# Patient Record
Sex: Female | Born: 1995
Health system: Southern US, Community
[De-identification: ages and names within clinical notes are randomized; demographics above are authoritative.]

## PROBLEM LIST (undated history)

## (undated) ENCOUNTER — Emergency Department (HOSPITAL_BASED_OUTPATIENT_CLINIC_OR_DEPARTMENT_OTHER): Admission: EM | Payer: Self-pay | Source: Home / Self Care

## (undated) DIAGNOSIS — F909 Attention-deficit hyperactivity disorder, unspecified type: Secondary | ICD-10-CM

## (undated) DIAGNOSIS — R519 Headache, unspecified: Secondary | ICD-10-CM

## (undated) DIAGNOSIS — H04129 Dry eye syndrome of unspecified lacrimal gland: Secondary | ICD-10-CM

## (undated) DIAGNOSIS — M545 Low back pain, unspecified: Secondary | ICD-10-CM

## (undated) DIAGNOSIS — R51 Headache: Secondary | ICD-10-CM

## (undated) DIAGNOSIS — H539 Unspecified visual disturbance: Secondary | ICD-10-CM

## (undated) DIAGNOSIS — F419 Anxiety disorder, unspecified: Secondary | ICD-10-CM

## (undated) DIAGNOSIS — Z8719 Personal history of other diseases of the digestive system: Secondary | ICD-10-CM

## (undated) DIAGNOSIS — H47339 Pseudopapilledema of optic disc, unspecified eye: Secondary | ICD-10-CM

## (undated) DIAGNOSIS — M25519 Pain in unspecified shoulder: Secondary | ICD-10-CM

## (undated) DIAGNOSIS — M542 Cervicalgia: Secondary | ICD-10-CM

## (undated) DIAGNOSIS — H8110 Benign paroxysmal vertigo, unspecified ear: Secondary | ICD-10-CM

## (undated) DIAGNOSIS — J189 Pneumonia, unspecified organism: Secondary | ICD-10-CM

## (undated) DIAGNOSIS — M5126 Other intervertebral disc displacement, lumbar region: Secondary | ICD-10-CM

## (undated) HISTORY — DX: Pain in unspecified shoulder: M25.519

## (undated) HISTORY — DX: Attention-deficit hyperactivity disorder, unspecified type: F90.9

## (undated) HISTORY — DX: Low back pain: M54.5

## (undated) HISTORY — DX: Low back pain, unspecified: M54.50

## (undated) HISTORY — DX: Headache: R51

## (undated) HISTORY — DX: Cervicalgia: M54.2

## (undated) HISTORY — DX: Benign paroxysmal vertigo, unspecified ear: H81.10

## (undated) HISTORY — DX: Headache, unspecified: R51.9

## (undated) HISTORY — DX: Pseudopapilledema of optic disc, unspecified eye: H47.339

## (undated) HISTORY — DX: Anxiety disorder, unspecified: F41.9

## (undated) HISTORY — PX: OTHER SURGICAL HISTORY: SHX169

## (undated) HISTORY — DX: Personal history of other diseases of the digestive system: Z87.19

## (undated) HISTORY — DX: Dry eye syndrome of unspecified lacrimal gland: H04.129

---
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
## 2015-01-24 ENCOUNTER — Ambulatory Visit: Payer: 59 | Admitting: Sports Medicine

## 2015-01-27 ENCOUNTER — Encounter: Payer: Self-pay | Admitting: Sports Medicine

## 2015-01-27 ENCOUNTER — Ambulatory Visit (INDEPENDENT_AMBULATORY_CARE_PROVIDER_SITE_OTHER): Payer: 59 | Admitting: Sports Medicine

## 2015-01-27 VITALS — BP 148/83 | HR 98 | Ht 67.0 in | Wt 296.0 lb

## 2015-01-27 DIAGNOSIS — E669 Obesity, unspecified: Secondary | ICD-10-CM

## 2015-01-27 DIAGNOSIS — G43909 Migraine, unspecified, not intractable, without status migrainosus: Secondary | ICD-10-CM | POA: Insufficient documentation

## 2015-01-27 DIAGNOSIS — G43001 Migraine without aura, not intractable, with status migrainosus: Secondary | ICD-10-CM | POA: Diagnosis not present

## 2015-01-27 DIAGNOSIS — R635 Abnormal weight gain: Secondary | ICD-10-CM | POA: Insufficient documentation

## 2015-01-27 MED ORDER — PHENTERMINE HCL 37.5 MG PO TABS: ORAL_TABLET | ORAL | Status: DC

## 2015-01-27 MED ORDER — LIRAGLUTIDE -WEIGHT MANAGEMENT 18 MG/3ML ~~LOC~~ SOPN: 3.0000 mg | PEN_INJECTOR | Freq: Every day | SUBCUTANEOUS | Status: DC

## 2015-01-27 MED ORDER — TOPIRAMATE 50 MG PO TABS: ORAL_TABLET | ORAL | Status: DC

## 2015-01-27 NOTE — Progress Notes (Signed)
  Subjective:    CC: headaches and obesity  HPI: This is a pleasant 19 year old female, she comes in with complaints of inability to lose weight, she wonders if her thyroid function is abnormal. We did check her thyroid function less than 1 year ago and her TSH was completely normal. She denies any symptoms of hypothyroidism. She does not exercise, she tried eating correctly, but does not yet know any specifics regarding calorie counting.  Headaches: History of Arnold-Chiari malformation post foramen magnum surgery.headaches are bifrontal, also posterior.  Positive for photophobia, phonophobia, nausea, they last for days, no aura. No focal findings.  Past medical history, Surgical history, Family history not pertinant except as noted below, Social history, Allergies, and medications have been entered into the medical record, reviewed, and no changes needed.   Review of Systems: No fevers, chills, night sweats, weight loss, chest pain, or shortness of breath.   Objective:    General: Well Developed, well nourished, and in no acute distress.  Neuro: Alert and oriented x3, extra-ocular muscles intact, sensation grossly intact.  HEENT: Normocephalic, atraumatic, pupils equal round reactive to light, neck supple, no masses, no lymphadenopathy, thyroid nonpalpable.  Skin: Warm and dry, no rashes. Cardiac: Regular rate and rhythm, no murmurs rubs or gallops, no lower extremity edema.  Respiratory: Clear to auscultation bilaterally. Not using accessory muscles, speaking in full sentences.  Impression and Recommendations:    I spent 40 minutes with this patient, greater than 50% was face-to-face time calcium regarding the above diagnosis.

## 2015-01-27 NOTE — Assessment & Plan Note (Addendum)
Starting phentermine and Saxenda. Also started exercise prescription Return monthly for weight checks and refills.

## 2015-01-27 NOTE — Assessment & Plan Note (Signed)
Headaches do closely resemble migraines. She has a history of Arnold-Chiari malformation and is post foremen magnum foraminotomy. We are going to start Topamax for migraine prevention, losing weight is going to help significantly. She will also discuss a new brain MRI with her neurosurgeon, she is approximately one year post her previous MRI.

## 2015-01-28 ENCOUNTER — Telehealth: Payer: Self-pay | Admitting: Sports Medicine

## 2015-01-28 NOTE — Telephone Encounter (Signed)
Received fax for prior authorization on Saxenda sent through cover my meds waiting on authorization. - CF °

## 2015-02-04 NOTE — Telephone Encounter (Signed)
Received fax from [employer] they denied coverage on Saxenda due to being used with other weight loss medications. I resubmitted and I am waiting on authorization. Case ID [id number]. - CF

## 2015-02-04 NOTE — Telephone Encounter (Signed)
Received fax from [employer] and they approved Saxenda from 02/04/2015 - 05/27/2015. Case # [id number] - Cf

## 2015-02-05 ENCOUNTER — Telehealth: Payer: Self-pay

## 2015-02-06 NOTE — Telephone Encounter (Signed)
ERROR. Emma Boyer,CMA  

## 2015-02-24 ENCOUNTER — Ambulatory Visit (INDEPENDENT_AMBULATORY_CARE_PROVIDER_SITE_OTHER): Payer: 59 | Admitting: Sports Medicine

## 2015-02-24 ENCOUNTER — Encounter: Payer: Self-pay | Admitting: Sports Medicine

## 2015-02-24 VITALS — BP 137/87 | HR 117 | Ht 67.0 in | Wt 280.0 lb

## 2015-02-24 DIAGNOSIS — E669 Obesity, unspecified: Secondary | ICD-10-CM | POA: Diagnosis not present

## 2015-02-24 DIAGNOSIS — G43009 Migraine without aura, not intractable, without status migrainosus: Secondary | ICD-10-CM | POA: Diagnosis not present

## 2015-02-24 MED ORDER — PHENTERMINE HCL 37.5 MG PO TABS: ORAL_TABLET | ORAL | Status: DC

## 2015-02-24 MED ORDER — TOPIRAMATE 25 MG PO TABS: 25.0000 mg | ORAL_TABLET | Freq: Every day | ORAL | Status: DC

## 2015-02-24 NOTE — Assessment & Plan Note (Signed)
16 pound weight loss in the first month.  Refilling phentermine, continue Saxenda and Topamax.

## 2015-02-24 NOTE — Assessment & Plan Note (Signed)
Intolerant of 50 mgTopamax.  Simply refilling at 25 mg daily.

## 2015-02-24 NOTE — Progress Notes (Signed)
  Subjective:    CC: Follow-up  HPI: Obesity: 16 pound weight loss in the first month, she phentermine, Saxenda, was unable to tolerate Topamax 50 milligrams and has dropped back down to 25. No adverse effects other than this.  Past medical history, Surgical history, Family history not pertinant except as noted below, Social history, Allergies, and medications have been entered into the medical record, reviewed, and no changes needed.   Review of Systems: No fevers, chills, night sweats, weight loss, chest pain, or shortness of breath.   Objective:    General: Well Developed, well nourished, and in no acute distress.  Neuro: Alert and oriented x3, extra-ocular muscles intact, sensation grossly intact.  HEENT: Normocephalic, atraumatic, pupils equal round reactive to light, neck supple, no masses, no lymphadenopathy, thyroid nonpalpable.  Skin: Warm and dry, no rashes. Cardiac: Regular rate and rhythm, no murmurs rubs or gallops, no lower extremity edema.  Respiratory: Clear to auscultation bilaterally. Not using accessory muscles, speaking in full sentences.  Impression and Recommendations:

## 2015-03-17 ENCOUNTER — Other Ambulatory Visit: Payer: Self-pay | Admitting: Sports Medicine

## 2015-03-22 ENCOUNTER — Emergency Department (HOSPITAL_BASED_OUTPATIENT_CLINIC_OR_DEPARTMENT_OTHER): Payer: 59

## 2015-03-22 ENCOUNTER — Encounter (HOSPITAL_BASED_OUTPATIENT_CLINIC_OR_DEPARTMENT_OTHER): Payer: Self-pay

## 2015-03-22 ENCOUNTER — Emergency Department
Admission: EM | Admit: 2015-03-22 | Discharge: 2015-03-22 | Disposition: A | Discharge status: Home Health | Payer: 59 | Source: Home / Self Care

## 2015-03-22 ENCOUNTER — Emergency Department (HOSPITAL_BASED_OUTPATIENT_CLINIC_OR_DEPARTMENT_OTHER)
Admission: EM | Admit: 2015-03-22 | Discharge: 2015-03-22 | Disposition: A | Discharge status: Home / Self Care | Payer: 59 | Attending: Emergency Medicine | Admitting: Emergency Medicine

## 2015-03-22 DIAGNOSIS — Z8669 Personal history of other diseases of the nervous system and sense organs: Secondary | ICD-10-CM | POA: Insufficient documentation

## 2015-03-22 DIAGNOSIS — R102 Pelvic and perineal pain: Secondary | ICD-10-CM | POA: Diagnosis not present

## 2015-03-22 DIAGNOSIS — N939 Abnormal uterine and vaginal bleeding, unspecified: Secondary | ICD-10-CM | POA: Insufficient documentation

## 2015-03-22 DIAGNOSIS — Z8739 Personal history of other diseases of the musculoskeletal system and connective tissue: Secondary | ICD-10-CM | POA: Insufficient documentation

## 2015-03-22 DIAGNOSIS — Z8701 Personal history of pneumonia (recurrent): Secondary | ICD-10-CM | POA: Insufficient documentation

## 2015-03-22 DIAGNOSIS — Z79899 Other long term (current) drug therapy: Secondary | ICD-10-CM | POA: Insufficient documentation

## 2015-03-22 DIAGNOSIS — R339 Retention of urine, unspecified: Secondary | ICD-10-CM | POA: Insufficient documentation

## 2015-03-22 DIAGNOSIS — R52 Pain, unspecified: Secondary | ICD-10-CM

## 2015-03-22 DIAGNOSIS — Z3202 Encounter for pregnancy test, result negative: Secondary | ICD-10-CM | POA: Diagnosis not present

## 2015-03-22 LAB — CBC WITH DIFFERENTIAL/PLATELET
BASOS ABS: 0 10*3/uL (ref 0.0–0.1)
Basophils Relative: 0 % (ref 0–1)
EOS ABS: 0.1 10*3/uL (ref 0.0–0.7)
EOS PCT: 1 % (ref 0–5)
HCT: 42.1 % (ref 36.0–46.0)
HEMOGLOBIN: 13.6 g/dL (ref 12.0–15.0)
Lymphocytes Relative: 27 % (ref 12–46)
Lymphs Abs: 3.5 10*3/uL (ref 0.7–4.0)
MCH: 27.1 pg (ref 26.0–34.0)
MCHC: 32.3 g/dL (ref 30.0–36.0)
MCV: 84 fL (ref 78.0–100.0)
Monocytes Absolute: 0.8 10*3/uL (ref 0.1–1.0)
Monocytes Relative: 7 % (ref 3–12)
NEUTROS ABS: 8.3 10*3/uL — AB (ref 1.7–7.7)
Neutrophils Relative %: 65 % (ref 43–77)
PLATELETS: 208 10*3/uL (ref 150–400)
RBC: 5.01 MIL/uL (ref 3.87–5.11)
RDW: 14 % (ref 11.5–15.5)
WBC: 12.7 10*3/uL — AB (ref 4.0–10.5)

## 2015-03-22 LAB — BASIC METABOLIC PANEL
Anion gap: 10 (ref 5–15)
BUN: 6 mg/dL (ref 6–20)
CHLORIDE: 107 mmol/L (ref 101–111)
CO2: 22 mmol/L (ref 22–32)
Calcium: 9.4 mg/dL (ref 8.9–10.3)
Creatinine, Ser: 0.72 mg/dL (ref 0.44–1.00)
GFR calc Af Amer: >60 mL/min (ref >60)
GFR calc non Af Amer: >60 mL/min (ref >60)
GLUCOSE: 79 mg/dL (ref 65–99)
Potassium: 3.3 mmol/L — ABNORMAL LOW (ref 3.5–5.1)
Sodium: 139 mmol/L (ref 135–145)

## 2015-03-22 LAB — URINE MICROSCOPIC-ADD ON

## 2015-03-22 LAB — URINALYSIS, ROUTINE W REFLEX MICROSCOPIC
BILIRUBIN URINE: NEGATIVE
Glucose, UA: NEGATIVE mg/dL
KETONES UR: 15 mg/dL — AB
LEUKOCYTES UA: NEGATIVE
Nitrite: POSITIVE — AB
PH: 6.5 (ref 5.0–8.0)
Protein, ur: NEGATIVE mg/dL
Specific Gravity, Urine: 1.004 — ABNORMAL LOW (ref 1.005–1.030)
Urobilinogen, UA: 1 mg/dL (ref 0.0–1.0)

## 2015-03-22 LAB — PREGNANCY, URINE: Preg Test, Ur: NEGATIVE

## 2015-03-22 MED ORDER — PHENAZOPYRIDINE HCL 200 MG PO TABS: 200.0000 mg | ORAL_TABLET | Freq: Three times a day (TID) | ORAL | Status: DC

## 2015-03-22 MED ORDER — ONDANSETRON 4 MG PO TBDP
4.0000 mg | ORAL_TABLET | Freq: Once | ORAL | Status: AC
  Administered 2015-03-22: 4 mg via ORAL
  Filled 2015-03-22: qty 1

## 2015-03-22 MED ORDER — HYDROCODONE-ACETAMINOPHEN 5-325 MG PO TABS
2.0000 | ORAL_TABLET | Freq: Once | ORAL | Status: AC
  Administered 2015-03-22: 2 via ORAL
  Filled 2015-03-22: qty 2

## 2015-03-22 MED ORDER — PHENAZOPYRIDINE HCL 100 MG PO TABS
100.0000 mg | ORAL_TABLET | Freq: Once | ORAL | Status: AC
  Administered 2015-03-22: 100 mg via ORAL
  Filled 2015-03-22: qty 1

## 2015-03-22 NOTE — ED Notes (Signed)
Converted urinary bag to leg bag per Dr. Fayrene Fearing verbal order. Discussed with patient and patient's sister at bedside urinary foley care. Both verbalized understanding. Urinary catheter seal broken and leg bag placed on patient , secured with leg straps.

## 2015-03-22 NOTE — Discharge Instructions (Signed)
Call [facility] at the number above for an appointment next week to discuss removal of your catheter, and further evaluation.  Acute Urinary Retention Acute urinary retention is the temporary inability to urinate. This is an uncommon problem in women. It can be caused by:  Infection.  A side effect of a medicine.  A problem in a nearby organ that presses or squeezes on the bladder or the urethra (the tube that drains the bladder).  Psychological problems.   Surgery on your bladder, urethra, or pelvic organs that causes obstruction to the outflow of urine from your bladder.  Overfilling of the bladder, a "bladder stretch injury" HOME CARE INSTRUCTIONS  If you are sent home with a Foley catheter and a drainage system, you will need to discuss the best course of action with your health care provider. While the catheter is in, maintain a good intake of fluids. Keep the drainage bag emptied and lower than your catheter. This is so that contaminated urine will not flow back into your bladder, which could lead to a urinary tract infection. There are two main types of drainage bags. One is a large bag that usually is used at night. It has a good capacity that will allow you to sleep through the night without having to empty it. The second type is called a leg bag. It has a smaller capacity so it needs to be emptied more frequently. However, the main advantage is that it can be attached by a leg strap and goes underneath your clothing, allowing you the freedom to move about or leave your home. Only take over-the-counter or prescription medicines for pain, discomfort, or fever as directed by your health care provider.  SEEK MEDICAL CARE IF:  You develop a low-grade fever.  You experience spasms or leakage of urine with the spasms. SEEK IMMEDIATE MEDICAL CARE IF:   You develop chills or fever.  Your catheter stops draining urine.  Your catheter falls out.  You start to develop increased  bleeding that does not respond to rest and increased fluid intake. MAKE SURE YOU:  Understand these instructions.  Will watch your condition.  Will get help right away if you are not doing well or get worse. Document Released: 08/01/2006 Document Revised: 05/23/2013 Document Reviewed: 01/11/2013 [facility] Patient Information 2015 [place], [place]. This information is not intended to replace advice given to you by your health care provider. Make sure you discuss any questions you have with your health care provider.

## 2015-03-22 NOTE — ED Provider Notes (Signed)
CSN: [id number]   Arrival date & time 03/22/15 1841  History  This chart was scribed for  Rolland Porter, MD by Bethel Born, ED Scribe. This patient was seen in room MH05/MH05 and the patient's care was started at 8:45 PM.  Chief Complaint  Patient presents with  . Urinary Retention    HPI The history is provided by the patient and a relative. No language interpreter was used.   Emma Boyer is a 19 y.o. female who presents to the Emergency Department complaining of urinary retention with onset last night. She felt strongly that she had to urinate last night but nothing would come out. She had a small trickle in the shower and took Azo. This morning she urinated normally and had relief. The feeling of fullness in the lower abdomen and inability to void returned this afternoon. Pt states that she had daily vaginal bleeding for 4 months until 2 weeks ago. The bleeding returned today.   Pt has been getting a daily Saxenda injection for 1 month for weight loss but has had no urinary problems until now.   Past Medical History  Diagnosis Date  . Pseudopapilledema   . HA (headache)   . Neck pain   . Shoulder pain   . Low back pain   . Bulging lumbar disc 2014  . Pneumonia   . Vision abnormalities     wears contacts, right eye worse    Past Surgical History  Procedure Laterality Date  . None    . Suboccipital craniectomy cervical laminectomy N/A 05/01/2013    Procedure: Chiari Decompression;  Surgeon: Maeola Harman, MD;  Location: [facility];  Service: Neurosurgery;  Laterality: N/A;  Chiari Decompression    Family History  Problem Relation Age of Onset  . High blood pressure Father   . Arthritis Father     History  Substance Use Topics  . Smoking status: Never Smoker   . Smokeless tobacco: Never Used  . Alcohol Use: No     Review of Systems  Constitutional: Negative for fever, chills, diaphoresis, appetite change and fatigue.  HENT: Negative for mouth sores, sore throat  and trouble swallowing.   Eyes: Negative for visual disturbance.  Respiratory: Negative for cough, chest tightness, shortness of breath and wheezing.   Cardiovascular: Negative for chest pain.  Gastrointestinal: Negative for nausea, vomiting, diarrhea and abdominal distention.  Endocrine: Negative for polydipsia, polyphagia and polyuria.  Genitourinary: Positive for decreased urine volume, vaginal bleeding and pelvic pain. Negative for dysuria, frequency, hematuria and vaginal discharge.  Musculoskeletal: Negative for gait problem.  Skin: Negative for color change, pallor and rash.  Neurological: Negative for dizziness, syncope, light-headedness and headaches.  Hematological: Does not bruise/bleed easily.  Psychiatric/Behavioral: Negative for behavioral problems and confusion.     Home Medications   Prior to Admission medications   Medication Sig Start Date End Date Taking? Authorizing Provider  ampicillin (PRINCIPEN) 500 MG capsule Take 500 mg by mouth once.   Yes Historical Provider, MD  phenazopyridine (PYRIDIUM) 200 MG tablet Take 1 tablet (200 mg total) by mouth 3 (three) times daily. 03/22/15   Rolland Porter, MD  phentermine (ADIPEX-P) 37.5 MG tablet One tab by mouth at 11 AM 02/24/15   Monica Becton, MD  SAXENDA 18 MG/3ML SOPN INJECT 3MG  INTO THE SKIN DAILY 0.6MG  INJECTED SUBCUTANEOUSLY DAILY X 1 WEEK THEN INCREASE BY 0.6MG  WEEKLY UNTIL REACHING 3MG  DAILY 03/17/15   Monica Becton, MD  topiramate (TOPAMAX) 25 MG tablet Take  1 tablet (25 mg total) by mouth daily. 02/24/15   Monica Becton, MD    Allergies  Peanut-containing drug products  Triage Vitals: BP 136/72 mmHg  Pulse 102  Temp(Src) 99 F (37.2 C) (Oral)  Resp 16  SpO2 100%  Physical Exam  Constitutional: She is oriented to person, place, and time. She appears well-developed and well-nourished. No distress.  HENT:  Head: Normocephalic.  Eyes: Conjunctivae are normal. Pupils are equal, round, and  reactive to light. No scleral icterus.  Neck: Normal range of motion. Neck supple. No thyromegaly present.  Cardiovascular: Normal rate and regular rhythm.  Exam reveals no gallop and no friction rub.   No murmur heard. Pulmonary/Chest: Effort normal and breath sounds normal. No respiratory distress. She has no wheezes. She has no rales.  Abdominal: Soft. Bowel sounds are normal. She exhibits no distension. There is no tenderness. There is no rebound.  Musculoskeletal: Normal range of motion.  Neurological: She is alert and oriented to person, place, and time.  Skin: Skin is warm and dry. No rash noted.  Psychiatric: She has a normal mood and affect. Her behavior is normal.    ED Course  Procedures   DIAGNOSTIC STUDIES: Oxygen Saturation is 100% on RA, normal by my interpretation.    COORDINATION OF CARE: 8:57 PM Discussed treatment plan which includes lab work, [place], and Urology consult with pt at bedside and pt agreed to plan.  9:18 PM-Consult complete with Dr. Nunzio Cory (Urology). Patient case explained and discussed.  Call ended at 9:19 PM   Labs Review-  Labs Reviewed  URINALYSIS, ROUTINE W REFLEX MICROSCOPIC (NOT AT [facility]) - Abnormal; Notable for the following:    Color, Urine ORANGE (*)    Specific Gravity, Urine 1.004 (*)    Hgb urine dipstick TRACE (*)    Ketones, ur 15 (*)    Nitrite POSITIVE (*)    All other components within normal limits  CBC WITH DIFFERENTIAL/PLATELET - Abnormal; Notable for the following:    WBC 12.7 (*)    Neutro Abs 8.3 (*)    All other components within normal limits  BASIC METABOLIC PANEL - Abnormal; Notable for the following:    Potassium 3.3 (*)    All other components within normal limits  PREGNANCY, URINE  URINE MICROSCOPIC-ADD ON    Imaging Review US Transvaginal Non-ob  03/22/2015   CLINICAL DATA:  Dysfunctional uterine bleeding for 4 months, sudden onset of urinary retention this morning, RIGHT flank and RIGHT lower quadrant pain  EXAM:  TRANSABDOMINAL AND TRANSVAGINAL ULTRASOUND OF PELVIS  TECHNIQUE: Both transabdominal and transvaginal ultrasound examinations of the pelvis were performed. Transabdominal technique was performed for global imaging of the pelvis including uterus, ovaries, adnexal regions, and pelvic cul-de-sac. It was necessary to proceed with endovaginal exam following the transabdominal exam to visualize the endometrium and LEFT ovary.  COMPARISON:  None  FINDINGS: Uterus  Measurements: 8.3 x 3.4 x 4.6 cm. Normal morphology without mass.  Endometrium  Thickness: 5 mm thick, normal. Small amount of endocervical fluid. Endometrial complex otherwise normal appearance.  Right ovary  Measurements: 4.0 x 2.1 x 3.2 cm. Simple cyst within RIGHT ovary 4.2 x 2.4 x 3.0 cm.  Left ovary  Measurements: 3.1 x 1.7 x 2.5 cm. Normal morphology without mass.  Other findings  No free fluid or additional adnexal masses.  IMPRESSION: Small RIGHT ovarian cyst.  Small amount of nonspecific endocervical fluid.  Otherwise negative exam.   Electronically Signed   By: Loraine Leriche  Tyron Russell M.D.   On: 03/22/2015 22:22   US Pelvis Complete  03/22/2015   CLINICAL DATA:  Dysfunctional uterine bleeding for 4 months, sudden onset of urinary retention this morning, RIGHT flank and RIGHT lower quadrant pain  EXAM: TRANSABDOMINAL AND TRANSVAGINAL ULTRASOUND OF PELVIS  TECHNIQUE: Both transabdominal and transvaginal ultrasound examinations of the pelvis were performed. Transabdominal technique was performed for global imaging of the pelvis including uterus, ovaries, adnexal regions, and pelvic cul-de-sac. It was necessary to proceed with endovaginal exam following the transabdominal exam to visualize the endometrium and LEFT ovary.  COMPARISON:  None  FINDINGS: Uterus  Measurements: 8.3 x 3.4 x 4.6 cm. Normal morphology without mass.  Endometrium  Thickness: 5 mm thick, normal. Small amount of endocervical fluid. Endometrial complex otherwise normal appearance.  Right ovary   Measurements: 4.0 x 2.1 x 3.2 cm. Simple cyst within RIGHT ovary 4.2 x 2.4 x 3.0 cm.  Left ovary  Measurements: 3.1 x 1.7 x 2.5 cm. Normal morphology without mass.  Other findings  No free fluid or additional adnexal masses.  IMPRESSION: Small RIGHT ovarian cyst.  Small amount of nonspecific endocervical fluid.  Otherwise negative exam.   Electronically Signed   By: Ulyses Southward M.D.   On: 03/22/2015 22:22    EKG Interpretation None      MDM   Final diagnoses:  Urinary retention   I discussed the case with Dr. Nunzio Cory of [facility]. He did not feel that any CNS or neuro imaging was indicated. I would agree. She has no neurological symptoms or findings other than her urinary retention. Pelvic ultrasound shows no acute findings. Urine not infected. Fitted with leg bag. Will be discharged home. Instructed in leg bag and catheter care. [facility] for follow-up.    Rolland Porter, MD 03/22/15 [phone]

## 2015-03-22 NOTE — ED Notes (Signed)
14 fr urinary foley placed under sterile procedure. Discussed with patient the process and she verbalized understanding. Area cleaned with soap and water. Urinary foley placed without difficulty. Amber color urine present. Catheter bag marked with date, time and placement. Patient reported no c/o of pain or difficulty.

## 2015-03-22 NOTE — ED Notes (Signed)
Patient here with urinary retention since this am. Reports that she noticed she was having trouble voiding last pm and then voided this am and minimal output since. Denies dysuria but complains of some pelvic fullness

## 2015-03-24 ENCOUNTER — Ambulatory Visit: Payer: 59 | Admitting: Sports Medicine

## 2015-03-31 ENCOUNTER — Ambulatory Visit (INDEPENDENT_AMBULATORY_CARE_PROVIDER_SITE_OTHER): Payer: 59 | Admitting: Sports Medicine

## 2015-03-31 ENCOUNTER — Encounter: Payer: Self-pay | Admitting: Sports Medicine

## 2015-03-31 VITALS — BP 122/76 | HR 97 | Ht 67.0 in | Wt 271.0 lb

## 2015-03-31 DIAGNOSIS — E669 Obesity, unspecified: Secondary | ICD-10-CM

## 2015-03-31 MED ORDER — LIRAGLUTIDE -WEIGHT MANAGEMENT 18 MG/3ML ~~LOC~~ SOPN: 3.0000 mg | PEN_INJECTOR | Freq: Every day | SUBCUTANEOUS | Status: DC

## 2015-03-31 MED ORDER — PHENTERMINE HCL 37.5 MG PO TABS: ORAL_TABLET | ORAL | Status: DC

## 2015-03-31 NOTE — Assessment & Plan Note (Addendum)
Urinary retention has resolved with treatment of the UTI. Refilling phentermine, she has had a good 9 pound weight loss.  Continue Saxenda, return in one month. Entering the second month

## 2015-03-31 NOTE — Progress Notes (Signed)
  Subjective:    CC: Obesity  HPI: Emma Boyer returns, she is a pleasant 19 year old female, she did have a good response to the initial month on phentermine and Saxenda, she did have an episode of difficulty urinating, that ended up being a UTI, which has now resolved on Keflex. She is happy with how things are going, and eager to continue.  Past medical history, Surgical history, Family history not pertinant except as noted below, Social history, Allergies, and medications have been entered into the medical record, reviewed, and no changes needed.   Review of Systems: No fevers, chills, night sweats, weight loss, chest pain, or shortness of breath.   Objective:    General: Well Developed, well nourished, and in no acute distress.  Neuro: Alert and oriented x3, extra-ocular muscles intact, sensation grossly intact.  HEENT: Normocephalic, atraumatic, pupils equal round reactive to light, neck supple, no masses, no lymphadenopathy, thyroid nonpalpable.  Skin: Warm and dry, no rashes. Cardiac: Regular rate and rhythm, no murmurs rubs or gallops, no lower extremity edema.  Respiratory: Clear to auscultation bilaterally. Not using accessory muscles, speaking in full sentences.  Impression and Recommendations:    I spent 25 minutes with this patient, greater than 50% was face-to-face time counseling regarding the above diagnoses

## 2015-04-28 ENCOUNTER — Other Ambulatory Visit: Payer: Self-pay | Admitting: Sports Medicine

## 2015-05-02 ENCOUNTER — Encounter: Payer: Self-pay | Admitting: Sports Medicine

## 2015-05-02 ENCOUNTER — Ambulatory Visit (INDEPENDENT_AMBULATORY_CARE_PROVIDER_SITE_OTHER): Payer: 59 | Admitting: Sports Medicine

## 2015-05-02 VITALS — BP 137/74 | HR 109 | Ht 67.0 in | Wt 268.0 lb

## 2015-05-02 DIAGNOSIS — Z23 Encounter for immunization: Secondary | ICD-10-CM | POA: Diagnosis not present

## 2015-05-02 DIAGNOSIS — G43009 Migraine without aura, not intractable, without status migrainosus: Secondary | ICD-10-CM | POA: Diagnosis not present

## 2015-05-02 DIAGNOSIS — E669 Obesity, unspecified: Secondary | ICD-10-CM | POA: Diagnosis not present

## 2015-05-02 MED ORDER — TOPIRAMATE 50 MG PO TABS: 50.0000 mg | ORAL_TABLET | Freq: Every day | ORAL | Status: DC

## 2015-05-02 MED ORDER — PHENTERMINE HCL 37.5 MG PO TABS: ORAL_TABLET | ORAL | Status: DC

## 2015-05-02 NOTE — Assessment & Plan Note (Signed)
3 pound weight loss. As we entered the third month of phentermine and she will continue the same dose of phentermine, moves Saxenda to the morning, and double Topamax to full tablet daily.

## 2015-05-02 NOTE — Progress Notes (Signed)
  Subjective:    CC: Follow-up  HPI: Obesity: Doing well on Saxenda, phentermine, Topamax, she is only doing 25 mg of Topamax per day. She is amenable to increase the dose, no side effects.  Past medical history, Surgical history, Family history not pertinant except as noted below, Social history, Allergies, and medications have been entered into the medical record, reviewed, and no changes needed.   Review of Systems: No fevers, chills, night sweats, weight loss, chest pain, or shortness of breath.   Objective:    General: Well Developed, well nourished, and in no acute distress.  Neuro: Alert and oriented x3, extra-ocular muscles intact, sensation grossly intact.  HEENT: Normocephalic, atraumatic, pupils equal round reactive to light, neck supple, no masses, no lymphadenopathy, thyroid nonpalpable.  Skin: Warm and dry, no rashes. Cardiac: Regular rate and rhythm, no murmurs rubs or gallops, no lower extremity edema.  Respiratory: Clear to auscultation bilaterally. Not using accessory muscles, speaking in full sentences.  Impression and Recommendations:

## 2015-05-19 ENCOUNTER — Other Ambulatory Visit: Payer: Self-pay | Admitting: Sports Medicine

## 2015-05-20 ENCOUNTER — Telehealth: Payer: Self-pay | Admitting: Sports Medicine

## 2015-05-20 NOTE — Telephone Encounter (Signed)
Received fax for prior authorization on Saxenda sent through cover my meds waiting on authorization. - CF °

## 2015-05-22 NOTE — Telephone Encounter (Signed)
Received fax from [employer] and they approved coverage on Saxenda from 05/20/2015 - 05/19/2016. Case Number [id number]. - CF

## 2015-05-30 ENCOUNTER — Ambulatory Visit (INDEPENDENT_AMBULATORY_CARE_PROVIDER_SITE_OTHER): Payer: 59 | Admitting: Sports Medicine

## 2015-05-30 VITALS — BP 123/62 | HR 110 | Wt 263.0 lb

## 2015-05-30 DIAGNOSIS — R635 Abnormal weight gain: Secondary | ICD-10-CM

## 2015-05-30 DIAGNOSIS — E669 Obesity, unspecified: Secondary | ICD-10-CM | POA: Diagnosis not present

## 2015-05-30 MED ORDER — TOPIRAMATE 100 MG PO TABS: 100.0000 mg | ORAL_TABLET | Freq: Every day | ORAL | Status: DC

## 2015-05-30 MED ORDER — PHENTERMINE HCL 37.5 MG PO TABS: ORAL_TABLET | ORAL | Status: DC

## 2015-05-30 NOTE — Assessment & Plan Note (Signed)
5 additional pound weight loss. As we into the fourth month where going to increase the Topamax.

## 2015-05-30 NOTE — Progress Notes (Signed)
  Subjective:    CC: Follow-up  HPI:  5 additional weight loss after 3 months of phentermine, Saxenda, and 50 mg Topamax. She is agreeable to increase the dose of the Topamax. No abnormal side effects, good tolerability, no complaints.  Past medical history, Surgical history, Family history not pertinant except as noted below, Social history, Allergies, and medications have been entered into the medical record, reviewed, and no changes needed.   Review of Systems: No fevers, chills, night sweats, weight loss, chest pain, or shortness of breath.   Objective:    General: Well Developed, well nourished, and in no acute distress.  Neuro: Alert and oriented x3, extra-ocular muscles intact, sensation grossly intact.  HEENT: Normocephalic, atraumatic, pupils equal round reactive to light, neck supple, no masses, no lymphadenopathy, thyroid nonpalpable.  Skin: Warm and dry, no rashes. Cardiac: Regular rate and rhythm, no murmurs rubs or gallops, no lower extremity edema.  Respiratory: Clear to auscultation bilaterally. Not using accessory muscles, speaking in full sentences.  Impression and Recommendations:

## 2015-06-18 ENCOUNTER — Other Ambulatory Visit: Payer: Self-pay | Admitting: Sports Medicine

## 2015-06-27 ENCOUNTER — Encounter: Payer: Self-pay | Admitting: Sports Medicine

## 2015-06-27 ENCOUNTER — Ambulatory Visit (INDEPENDENT_AMBULATORY_CARE_PROVIDER_SITE_OTHER): Payer: 59 | Admitting: Sports Medicine

## 2015-06-27 VITALS — BP 133/84 | HR 102 | Wt 260.0 lb

## 2015-06-27 DIAGNOSIS — E669 Obesity, unspecified: Secondary | ICD-10-CM | POA: Diagnosis not present

## 2015-06-27 DIAGNOSIS — R635 Abnormal weight gain: Secondary | ICD-10-CM

## 2015-06-27 MED ORDER — PHENTERMINE HCL 37.5 MG PO TABS: ORAL_TABLET | ORAL | Status: DC

## 2015-06-27 MED ORDER — TOPIRAMATE 100 MG PO TABS: 100.0000 mg | ORAL_TABLET | Freq: Every day | ORAL | Status: DC

## 2015-06-27 NOTE — Assessment & Plan Note (Signed)
Continue 100 mg of Topamax, refilling phentermine as we enter the fifth month. Good additional weight loss. Unfortunately she has been off of Saxenda now for a week, I cautioned that we may need to start back at the starting dose to avoid excessive nausea. Return in one month.

## 2015-06-27 NOTE — Progress Notes (Signed)
  Subjective:    CC: Weight check  HPI:  This pleasant 19 year old female returns, she is a sophomore at [employer], she is now 4 months into weight loss treatment, doing well with phentermine, Saxenda, Topamax, 36 pound weight loss over the past 4 months. No side effects. She has unfortunately been off of her Saxenda for the past week.  Past medical history, Surgical history, Family history not pertinant except as noted below, Social history, Allergies, and medications have been entered into the medical record, reviewed, and no changes needed.   Review of Systems: No fevers, chills, night sweats, weight loss, chest pain, or shortness of breath.   Objective:    General: Well Developed, well nourished, and in no acute distress.  Neuro: Alert and oriented x3, extra-ocular muscles intact, sensation grossly intact.  HEENT: Normocephalic, atraumatic, pupils equal round reactive to light, neck supple, no masses, no lymphadenopathy, thyroid nonpalpable.  Skin: Warm and dry, no rashes. Cardiac: Regular rate and rhythm, no murmurs rubs or gallops, no lower extremity edema.  Respiratory: Clear to auscultation bilaterally. Not using accessory muscles, speaking in full sentences.  Impression and Recommendations:

## 2015-07-04 ENCOUNTER — Ambulatory Visit: Payer: Self-pay | Admitting: Sports Medicine

## 2015-07-25 ENCOUNTER — Ambulatory Visit: Payer: Self-pay | Admitting: Sports Medicine

## 2015-07-25 ENCOUNTER — Other Ambulatory Visit: Payer: Self-pay | Admitting: Sports Medicine

## 2015-07-29 ENCOUNTER — Ambulatory Visit (INDEPENDENT_AMBULATORY_CARE_PROVIDER_SITE_OTHER): Payer: 59 | Admitting: Sports Medicine

## 2015-07-29 VITALS — BP 111/69 | HR 85 | Temp 98.5°F | Resp 18 | Wt 256.6 lb

## 2015-07-29 DIAGNOSIS — E669 Obesity, unspecified: Secondary | ICD-10-CM

## 2015-07-29 DIAGNOSIS — R635 Abnormal weight gain: Secondary | ICD-10-CM | POA: Diagnosis not present

## 2015-07-29 MED ORDER — PHENTERMINE HCL 37.5 MG PO TABS: ORAL_TABLET | ORAL | Status: DC

## 2015-07-29 MED ORDER — TOPIRAMATE 100 MG PO TABS: 150.0000 mg | ORAL_TABLET | Freq: Every day | ORAL | Status: DC

## 2015-07-29 NOTE — Progress Notes (Signed)
  Subjective:    CC: Weight check  HPI: After 5 months of phentermine, Emma Boyer has lost an additional 4 pounds, she does continue with Topamax and Saxenda as well without side effects.  Past medical history, Surgical history, Family history not pertinant except as noted below, Social history, Allergies, and medications have been entered into the medical record, reviewed, and no changes needed.   Review of Systems: No fevers, chills, night sweats, weight loss, chest pain, or shortness of breath.   Objective:    General: Well Developed, well nourished, and in no acute distress.  Neuro: Alert and oriented x3, extra-ocular muscles intact, sensation grossly intact.  HEENT: Normocephalic, atraumatic, pupils equal round reactive to light, neck supple, no masses, no lymphadenopathy, thyroid nonpalpable.  Skin: Warm and dry, no rashes. Cardiac: Regular rate and rhythm, no murmurs rubs or gallops, no lower extremity edema.  Respiratory: Clear to auscultation bilaterally. Not using accessory muscles, speaking in full sentences.  Impression and Recommendations:

## 2015-07-29 NOTE — Assessment & Plan Note (Signed)
Additional 4 pound weight loss over the past month. Continue phentermine, Saxenda, increasing Topamax to 150 mg. Return in one month.

## 2015-08-19 DIAGNOSIS — K602 Anal fissure, unspecified: Secondary | ICD-10-CM | POA: Diagnosis not present

## 2015-08-19 DIAGNOSIS — K625 Hemorrhage of anus and rectum: Secondary | ICD-10-CM | POA: Diagnosis not present

## 2015-08-19 DIAGNOSIS — K648 Other hemorrhoids: Secondary | ICD-10-CM | POA: Diagnosis not present

## 2015-08-27 ENCOUNTER — Other Ambulatory Visit: Payer: Self-pay | Admitting: Sports Medicine

## 2015-08-27 MED FILL — SAXENDA 18 MG/3 ML PEN: 18 | 30 days supply | Qty: 15 | Fill #0

## 2015-08-29 ENCOUNTER — Ambulatory Visit (INDEPENDENT_AMBULATORY_CARE_PROVIDER_SITE_OTHER): Payer: 59 | Admitting: Sports Medicine

## 2015-08-29 VITALS — BP 105/64 | HR 86 | Resp 18 | Wt 251.4 lb

## 2015-08-29 DIAGNOSIS — G43009 Migraine without aura, not intractable, without status migrainosus: Secondary | ICD-10-CM

## 2015-08-29 DIAGNOSIS — E669 Obesity, unspecified: Secondary | ICD-10-CM | POA: Diagnosis not present

## 2015-08-29 DIAGNOSIS — Z8669 Personal history of other diseases of the nervous system and sense organs: Secondary | ICD-10-CM | POA: Diagnosis not present

## 2015-08-29 MED ORDER — TOPIRAMATE 200 MG PO TABS: 200.0000 mg | ORAL_TABLET | Freq: Every day | ORAL | Status: DC

## 2015-08-29 MED ORDER — PHENTERMINE HCL 37.5 MG PO TABS: ORAL_TABLET | ORAL | Status: DC

## 2015-08-29 MED ORDER — TIZANIDINE HCL 4 MG PO TABS: 4.0000 mg | ORAL_TABLET | Freq: Every evening | ORAL | Status: DC

## 2015-08-29 MED ORDER — LIRAGLUTIDE -WEIGHT MANAGEMENT 18 MG/3ML ~~LOC~~ SOPN
3.0000 mg | PEN_INJECTOR | Freq: Every day | SUBCUTANEOUS | Status: DC
Start: 2015-08-29 — End: 2015-11-21

## 2015-08-29 MED FILL — PHENTERMINE 37.5 MG TABLET: 37.5 | 90 days supply | Qty: 45 | Fill #0

## 2015-08-29 MED FILL — tiZANidine HCL 4 MG TABS: 4 | 40 days supply | Qty: 40 | Fill #0

## 2015-08-29 MED FILL — TOPIRAMATE 200 MG TABLET: 200 | 90 days supply | Qty: 90 | Fill #0

## 2015-08-29 NOTE — Assessment & Plan Note (Addendum)
Good but slow continued weight loss totaling 45 pounds today, refilling phentermine , we have finished 6 full months, we are going to switch to a half dose, refilling Topamax, Saxenda. Return in 3 months

## 2015-08-29 NOTE — Assessment & Plan Note (Signed)
Refilling Zanaflex for use as needed.

## 2015-08-29 NOTE — Progress Notes (Signed)
  Subjective:    CC: weight check  HPI: Emma DavenportShelby returns, she lost an additional 5 pounds. She is on phentermine, Topamax, Saxenda.  Migraine headaches: Needs a refill on Zanaflex  Past medical history, Surgical history, Family history not pertinant except as noted below, Social history, Allergies, and medications have been entered into the medical record, reviewed, and no changes needed.   Review of Systems: No fevers, chills, night sweats, weight loss, chest pain, or shortness of breath.   Objective:    General: Well Developed, well nourished, and in no acute distress.  Neuro: Alert and oriented x3, extra-ocular muscles intact, sensation grossly intact.  HEENT: Normocephalic, atraumatic, pupils equal round reactive to light, neck supple, no masses, no lymphadenopathy, thyroid nonpalpable.  Skin: Warm and dry, no rashes. Cardiac: Regular rate and rhythm, no murmurs rubs or gallops, no lower extremity edema.  Respiratory: Clear to auscultation bilaterally. Not using accessory muscles, speaking in full sentences.  Impression and Recommendations:

## 2015-09-23 ENCOUNTER — Ambulatory Visit (INDEPENDENT_AMBULATORY_CARE_PROVIDER_SITE_OTHER): Payer: 59 | Admitting: Sports Medicine

## 2015-09-23 VITALS — BP 112/68 | HR 116 | Temp 98.5°F | Resp 18 | Wt 251.0 lb

## 2015-09-23 DIAGNOSIS — J101 Influenza due to other identified influenza virus with other respiratory manifestations: Secondary | ICD-10-CM | POA: Diagnosis not present

## 2015-09-23 DIAGNOSIS — B349 Viral infection, unspecified: Secondary | ICD-10-CM

## 2015-09-23 LAB — POCT INFLUENZA A/B
Influenza A, POC: POSITIVE — AB
Influenza B, POC: NEGATIVE

## 2015-09-23 MED ORDER — OSELTAMIVIR PHOSPHATE 75 MG PO CAPS: 75.0000 mg | ORAL_CAPSULE | Freq: Two times a day (BID) | ORAL | Status: DC

## 2015-09-23 MED FILL — TAMIFLU 75 MG GELCAP: 75 | 10 days supply | Qty: 10 | Fill #0

## 2015-09-23 NOTE — Progress Notes (Signed)
  Subjective:    CC: Feeling sick  HPI: This is a pleasant 20 year old female, for the past day she's had fevers to 100F, malaise, muscle aches, body aches, cough productive of clear sputum, minimal nausea, diarrhea, no dominant pain, skin rash. No shortness of breath. Slight chest tightness.  Past medical history, Surgical history, Family history not pertinant except as noted below, Social history, Allergies, and medications have been entered into the medical record, reviewed, and no changes needed.   Review of Systems: No fevers, chills, night sweats, weight loss, chest pain, or shortness of breath.   Objective:    General: Well Developed, well nourished, and in no acute distress.  Neuro: Alert and oriented x3, extra-ocular muscles intact, sensation grossly intact.  HEENT: Normocephalic, atraumatic, pupils equal round reactive to light, neck supple, no masses, no lymphadenopathy, thyroid nonpalpable. Oropharynx, nasopharynx, ear canals unremarkable. Skin: Warm and dry, no rashes. Cardiac: Regular rate and rhythm, no murmurs rubs or gallops, no lower extremity edema.  Respiratory: Clear to auscultation bilaterally. Not using accessory muscles, speaking in full sentences.  Impression and Recommendations:

## 2015-09-23 NOTE — Patient Instructions (Signed)

## 2015-09-23 NOTE — Addendum Note (Signed)
Addended by: Baird Kay on: 09/23/2015 10:53 AM   Modules accepted: Orders

## 2015-09-23 NOTE — Assessment & Plan Note (Addendum)
Positive influenza A, adding Tamiflu, she can use over-the-counter cold and flu medication. Return as needed.

## 2015-09-25 ENCOUNTER — Telehealth: Payer: Self-pay

## 2015-09-25 MED ORDER — BENZONATATE 200 MG PO CAPS: 200.0000 mg | ORAL_CAPSULE | Freq: Three times a day (TID) | ORAL | Status: DC | PRN

## 2015-09-25 MED ORDER — HYDROCOD POLST-CPM POLST ER 10-8 MG/5ML PO SUER: 5.0000 mL | Freq: Two times a day (BID) | ORAL | Status: DC | PRN

## 2015-09-25 MED FILL — SAXENDA 18 MG/3 ML PEN: 18 | 30 days supply | Qty: 15 | Fill #0

## 2015-09-25 NOTE — Telephone Encounter (Signed)
Prescriptions for [employer] and Tussionex are here to pick up.

## 2015-09-25 NOTE — Telephone Encounter (Signed)
Pt advised of Rx's to pick up. Will take them to [facility] so Pt can pick them up and start tonight. No further questions.

## 2015-09-25 NOTE — Telephone Encounter (Signed)
VM left asking if patient can get something for her cough at night. Please assist.

## 2015-09-26 ENCOUNTER — Ambulatory Visit: Payer: Self-pay | Admitting: Family Medicine

## 2015-10-01 ENCOUNTER — Telehealth: Payer: Self-pay

## 2015-10-03 ENCOUNTER — Encounter: Payer: Self-pay | Admitting: Sports Medicine

## 2015-10-03 ENCOUNTER — Ambulatory Visit (INDEPENDENT_AMBULATORY_CARE_PROVIDER_SITE_OTHER): Payer: 59 | Admitting: Sports Medicine

## 2015-10-03 ENCOUNTER — Ambulatory Visit (INDEPENDENT_AMBULATORY_CARE_PROVIDER_SITE_OTHER): Payer: 59

## 2015-10-03 DIAGNOSIS — J101 Influenza due to other identified influenza virus with other respiratory manifestations: Secondary | ICD-10-CM

## 2015-10-03 DIAGNOSIS — R05 Cough: Secondary | ICD-10-CM

## 2015-10-03 MED ORDER — AZITHROMYCIN 250 MG PO TABS
ORAL_TABLET | ORAL | Status: DC
Start: 2015-10-03 — End: 2015-11-21

## 2015-10-03 MED ORDER — PREDNISONE 50 MG PO TABS: 50.0000 mg | ORAL_TABLET | Freq: Every day | ORAL | Status: DC

## 2015-10-03 MED FILL — AZITHROMYCIN 250 MG TABLET: 250 | 5 days supply | Qty: 6 | Fill #0

## 2015-10-03 MED FILL — predniSONE 50 MG TABS: 50 | 5 days supply | Qty: 5 | Fill #0

## 2015-10-03 NOTE — Assessment & Plan Note (Signed)
Persistent cough, likely postinfectious we are going to go ahead and obtain a chest x-ray, and add prednisone and azithromycin, she has decreased air movement in the right lung field. Advised that even despite treatment this could persist for 4 weeks.

## 2015-10-03 NOTE — Progress Notes (Signed)
  Subjective:    CC: Coughing  HPI: I treated this pleasant 20 year old female several days ago for a positive influenza test, she feels significantly better but has this persistent cough productive of greenish sputum. No fevers, chills, muscle aches, body aches. Symptoms are overall mild and stable.  Past medical history, Surgical history, Family history not pertinant except as noted below, Social history, Allergies, and medications have been entered into the medical record, reviewed, and no changes needed.   Review of Systems: No fevers, chills, night sweats, weight loss, chest pain, or shortness of breath.   Objective:    General: Well Developed, well nourished, and in no acute distress.  Neuro: Alert and oriented x3, extra-ocular muscles intact, sensation grossly intact.  HEENT: Normocephalic, atraumatic, pupils equal round reactive to light, neck supple, no masses, no lymphadenopathy, thyroid nonpalpable.  Skin: Warm and dry, no rashes. Cardiac: Regular rate and rhythm, no murmurs rubs or gallops, no lower extremity edema.  Respiratory: Clear to auscultation bilaterally. Not using accessory muscles, speaking in full sentences. Slightly decreased air movement on the right side.  Impression and Recommendations:    I spent 25 minutes with this patient, greater than 50% was face-to-face time counseling regarding the above diagnoses

## 2015-10-31 ENCOUNTER — Ambulatory Visit: Payer: Self-pay | Admitting: Sports Medicine

## 2015-11-03 MED FILL — AMPICILLIN TR 500 MG CAP: 500 | 30 days supply | Qty: 60 | Fill #1

## 2015-11-07 ENCOUNTER — Other Ambulatory Visit: Payer: Self-pay | Admitting: Sports Medicine

## 2015-11-07 MED FILL — NOVOFINE 30 NEEDLES: 30G X 8 MM | 90 days supply | Qty: 100 | Fill #0

## 2015-11-07 MED FILL — SAXENDA 18 MG/3 ML PEN: 18 | 30 days supply | Qty: 15 | Fill #1

## 2015-11-21 ENCOUNTER — Ambulatory Visit (INDEPENDENT_AMBULATORY_CARE_PROVIDER_SITE_OTHER): Payer: 59 | Admitting: Sports Medicine

## 2015-11-21 ENCOUNTER — Other Ambulatory Visit: Payer: Self-pay | Admitting: Sports Medicine

## 2015-11-21 DIAGNOSIS — M654 Radial styloid tenosynovitis [de Quervain]: Secondary | ICD-10-CM | POA: Diagnosis not present

## 2015-11-21 DIAGNOSIS — R635 Abnormal weight gain: Secondary | ICD-10-CM

## 2015-11-21 DIAGNOSIS — E669 Obesity, unspecified: Secondary | ICD-10-CM

## 2015-11-21 MED ORDER — PHENTERMINE HCL 37.5 MG PO TABS: ORAL_TABLET | ORAL | Status: DC

## 2015-11-21 MED ORDER — MELOXICAM 15 MG PO TABS: ORAL_TABLET | ORAL | Status: DC

## 2015-11-21 MED ORDER — LIRAGLUTIDE -WEIGHT MANAGEMENT 18 MG/3ML ~~LOC~~ SOPN: 3.0000 mg | PEN_INJECTOR | Freq: Every day | SUBCUTANEOUS | Status: DC

## 2015-11-21 MED ORDER — TOPIRAMATE 200 MG PO TABS: 200.0000 mg | ORAL_TABLET | Freq: Every day | ORAL | Status: DC

## 2015-11-21 MED FILL — TOPIRAMATE 200 MG TABLET: 200 | 90 days supply | Qty: 90 | Fill #0

## 2015-11-21 MED FILL — MELOXICAM 15 MG TABLET: 15 | 30 days supply | Qty: 30 | Fill #0

## 2015-11-21 NOTE — Assessment & Plan Note (Signed)
Nearly 50 pound weight loss so far after 9 months, refilling for an additional 3 months of half dose phentermine, continue Topamax and Saxenda. We will discontinue after this.

## 2015-11-21 NOTE — Assessment & Plan Note (Signed)
Meloxicam, rehabilitation exercises.

## 2015-11-21 NOTE — Progress Notes (Signed)
  Subjective:    CC: Follow-up  HPI: Abnormal weight gain: Weight has been stable over the last 3 months of phentermine, she continued Saxenda and Topamax.  Thumb pain: Bilateral, burning, localized over the first extensor compartment and worse with activity, intermittent and not present today.  Past medical history, Surgical history, Family history not pertinant except as noted below, Social history, Allergies, and medications have been entered into the medical record, reviewed, and no changes needed.   Review of Systems: No fevers, chills, night sweats, weight loss, chest pain, or shortness of breath.   Objective:    General: Well Developed, well nourished, and in no acute distress.  Neuro: Alert and oriented x3, extra-ocular muscles intact, sensation grossly intact.  HEENT: Normocephalic, atraumatic, pupils equal round reactive to light, neck supple, no masses, no lymphadenopathy, thyroid nonpalpable.  Skin: Warm and dry, no rashes. Cardiac: Regular rate and rhythm, no murmurs rubs or gallops, no lower extremity edema.  Respiratory: Clear to auscultation bilaterally. Not using accessory muscles, speaking in full sentences. Bilateral Wrist: Inspection normal with no visible erythema or swelling. ROM smooth and normal with good flexion and extension and ulnar/radial deviation that is symmetrical with opposite wrist. Palpation is normal over metacarpals, navicular, lunate, and TFCC; tendons without tenderness/ swelling No snuffbox tenderness. No tenderness over Canal of [place]. Strength 5/5 in all directions without pain. Negative Finkelstein, tinel's and phalens. Negative Watson's test.  Impression and Recommendations:    I spent 25 minutes with this patient, greater than 50% was face-to-face time counseling regarding the above diagnoses

## 2015-11-28 ENCOUNTER — Ambulatory Visit: Payer: Self-pay | Admitting: Sports Medicine

## 2015-11-28 MED FILL — PHENTERMINE 37.5 MG TABLET: 37.5 | 90 days supply | Qty: 45 | Fill #0

## 2015-12-12 MED FILL — SAXENDA 18 MG/3 ML PEN: 18 | 30 days supply | Qty: 15 | Fill #2

## 2015-12-12 MED FILL — AMPICILLIN TR 500 MG CAP: 500 | 30 days supply | Qty: 60 | Fill #2

## 2015-12-18 MED FILL — MELOXICAM 15 MG TABLET: 15 | 30 days supply | Qty: 30 | Fill #1

## 2015-12-19 DIAGNOSIS — Z01419 Encounter for gynecological examination (general) (routine) without abnormal findings: Secondary | ICD-10-CM | POA: Diagnosis not present

## 2015-12-19 DIAGNOSIS — Z6841 Body Mass Index (BMI) 40.0 and over, adult: Secondary | ICD-10-CM | POA: Diagnosis not present

## 2015-12-19 DIAGNOSIS — Z1159 Encounter for screening for other viral diseases: Secondary | ICD-10-CM | POA: Diagnosis not present

## 2015-12-19 DIAGNOSIS — Z113 Encounter for screening for infections with a predominantly sexual mode of transmission: Secondary | ICD-10-CM | POA: Diagnosis not present

## 2016-01-09 MED FILL — SAXENDA 18 MG/3 ML PEN: 18 | 30 days supply | Qty: 15 | Fill #3

## 2016-01-13 MED FILL — AMPICILLIN TR 500 MG CAP: 500 | 30 days supply | Qty: 60 | Fill #0

## 2016-01-20 MED FILL — MELOXICAM 15 MG TABLET: 15 | 30 days supply | Qty: 30 | Fill #2

## 2016-02-11 MED FILL — SAXENDA 18 MG/3 ML PEN: 18 | 30 days supply | Qty: 15 | Fill #4

## 2016-02-11 MED FILL — tiZANidine HCL 4 MG TABS: 4 | 40 days supply | Qty: 40 | Fill #1

## 2016-02-16 MED FILL — AMPICILLIN TR 500 MG CAP: 500 | 30 days supply | Qty: 60 | Fill #1

## 2016-02-16 MED FILL — MELOXICAM 15 MG TABLET: 15 | 30 days supply | Qty: 30 | Fill #3

## 2016-02-20 ENCOUNTER — Ambulatory Visit: Payer: Self-pay | Admitting: Sports Medicine

## 2016-02-26 ENCOUNTER — Ambulatory Visit (INDEPENDENT_AMBULATORY_CARE_PROVIDER_SITE_OTHER): Payer: 59 | Admitting: Sports Medicine

## 2016-02-26 ENCOUNTER — Encounter: Payer: Self-pay | Admitting: Sports Medicine

## 2016-02-26 DIAGNOSIS — R635 Abnormal weight gain: Secondary | ICD-10-CM | POA: Diagnosis not present

## 2016-02-26 MED ORDER — NALTREXONE-BUPROPION HCL ER 8-90 MG PO TB12: ORAL_TABLET | ORAL | Status: DC

## 2016-02-26 NOTE — Progress Notes (Signed)
  Subjective:    CC: Follow-up  HPI: Obesity: Over 50 pounds weight loss with maintenance of weight loss. She has now finished one year of low-dose phentermine, and continues with Saxenda and Topamax. Agreeable to try Contrave now to help with maintenance of weight loss.  Past medical history, Surgical history, Family history not pertinant except as noted below, Social history, Allergies, and medications have been entered into the medical record, reviewed, and no changes needed.   Review of Systems: No fevers, chills, night sweats, weight loss, chest pain, or shortness of breath.   Objective:    General: Well Developed, well nourished, and in no acute distress.  Neuro: Alert and oriented x3, extra-ocular muscles intact, sensation grossly intact.  HEENT: Normocephalic, atraumatic, pupils equal round reactive to light, neck supple, no masses, no lymphadenopathy, thyroid nonpalpable.  Skin: Warm and dry, no rashes. Cardiac: Regular rate and rhythm, no murmurs rubs or gallops, no lower extremity edema.  Respiratory: Clear to auscultation bilaterally. Not using accessory muscles, speaking in full sentences.  Impression and Recommendations:    I spent 25 minutes with this patient, greater than 50% was face-to-face time counseling regarding the above diagnoses

## 2016-02-26 NOTE — Assessment & Plan Note (Signed)
Weight loss has continued, over 50 pounds so far, we finished one year of low-dose phentermine, Saxenda, Topamax. Continue Topamax, Saxenda. Adding Contrave. Return in 3 months.

## 2016-03-08 ENCOUNTER — Telehealth: Payer: Self-pay | Admitting: *Deleted

## 2016-03-08 NOTE — Telephone Encounter (Signed)
PA initiated for Contrave over the phone ref # was [id number]

## 2016-03-09 NOTE — Telephone Encounter (Signed)
Also faxed PA form

## 2016-03-11 MED FILL — SAXENDA 18 MG/3 ML PEN: 18 | 30 days supply | Qty: 15 | Fill #5

## 2016-03-11 MED FILL — TOPIRAMATE 200 MG TABLET: 200 | 90 days supply | Qty: 90 | Fill #0

## 2016-03-11 NOTE — Telephone Encounter (Signed)
Pt called to see if there was any update on this PA. Will route for review.

## 2016-03-18 ENCOUNTER — Other Ambulatory Visit: Payer: Self-pay | Admitting: Sports Medicine

## 2016-03-18 DIAGNOSIS — M654 Radial styloid tenosynovitis [de Quervain]: Secondary | ICD-10-CM

## 2016-03-18 MED FILL — AMPICILLIN TR 500 MG CAP: 500 | 30 days supply | Qty: 60 | Fill #2

## 2016-03-18 MED FILL — MELOXICAM 15 MG TABLET: 15 | 30 days supply | Qty: 30 | Fill #0

## 2016-03-18 NOTE — Telephone Encounter (Signed)
Pt reports she got a letter from her insurance denying coverage for Contrave. Insurance requires her to take this Rx in combination with another weight loss medication. They listed the following as options: adipex, belviq, or xenical. Will route to PCP.

## 2016-03-19 MED ORDER — LIRAGLUTIDE 18 MG/3ML ~~LOC~~ SOPN: PEN_INJECTOR | SUBCUTANEOUS | 3 refills | Status: DC

## 2016-03-19 MED FILL — NOVOFINE 30 NEEDLES: 30G X 8 MM | 90 days supply | Qty: 100 | Fill #1

## 2016-03-19 MED FILL — ADAPALENE 0.3% GEL: 0.3 | 30 days supply | Qty: 45 | Fill #0

## 2016-03-19 NOTE — Telephone Encounter (Signed)
This medication was denied because Contrave was prescribed in addition to  the saxenda and topamax that she is already taking for weight loss. Anti-obesity meds cannot be used in combination with other anti-obesity meds.  Called patient and her sister answered. She will have her call me back

## 2016-03-19 NOTE — Telephone Encounter (Signed)
We will discontinue the Saxenda and use Victoza instead, then resubmit the Contrave so that we are not using 2 obesity medications.

## 2016-03-19 NOTE — Telephone Encounter (Signed)
Spoke to the patient about outcome from insurance. Will rout to provider for reccomendations

## 2016-03-19 NOTE — Telephone Encounter (Signed)
Anti obesity meds cannot be used in combination with other anti obesity meds and this per the insurance company

## 2016-03-24 NOTE — Telephone Encounter (Signed)
Pt.notified

## 2016-03-31 MED FILL — CONTRAVE ER 8-90 MG TABLET: 8-90 | 30 days supply | Qty: 120 | Fill #0

## 2016-03-31 NOTE — Telephone Encounter (Signed)
Resubmitted PA for contrave over the phone and it was approved. PA approval  # is [id number]. Called and left a vm on patients cell and also spoke with Zack SealKeyona at [facility]

## 2016-04-23 MED FILL — AMPICILLIN TR 500 MG CAP: 500 | 30 days supply | Qty: 60 | Fill #0

## 2016-04-30 DIAGNOSIS — H52203 Unspecified astigmatism, bilateral: Secondary | ICD-10-CM | POA: Diagnosis not present

## 2016-04-30 DIAGNOSIS — H5213 Myopia, bilateral: Secondary | ICD-10-CM | POA: Diagnosis not present

## 2016-04-30 DIAGNOSIS — Z135 Encounter for screening for eye and ear disorders: Secondary | ICD-10-CM | POA: Diagnosis not present

## 2016-04-30 MED FILL — MELOXICAM 15 MG TABLET: 15 | 30 days supply | Qty: 30 | Fill #1

## 2016-05-07 MED FILL — CONTRAVE ER 8-90 MG TABLET: 8-90 | 30 days supply | Qty: 120 | Fill #1

## 2016-05-27 MED FILL — AMPICILLIN TR 500 MG CAP: 500 | 30 days supply | Qty: 60 | Fill #1

## 2016-05-27 MED FILL — MELOXICAM 15 MG TABLET: 15 | 30 days supply | Qty: 30 | Fill #2

## 2016-06-01 ENCOUNTER — Other Ambulatory Visit: Payer: Self-pay | Admitting: Sports Medicine

## 2016-06-01 MED FILL — CONTRAVE ER 8-90 MG TABLET: 8-90 | 30 days supply | Qty: 120 | Fill #2

## 2016-06-01 MED FILL — TOPIRAMATE 200 MG TABLET: 200 | 90 days supply | Qty: 90 | Fill #0

## 2016-06-04 ENCOUNTER — Ambulatory Visit: Payer: Self-pay | Admitting: Sports Medicine

## 2016-06-18 ENCOUNTER — Encounter: Payer: Self-pay | Admitting: Sports Medicine

## 2016-06-18 ENCOUNTER — Ambulatory Visit (INDEPENDENT_AMBULATORY_CARE_PROVIDER_SITE_OTHER): Payer: 59 | Admitting: Sports Medicine

## 2016-06-18 DIAGNOSIS — R635 Abnormal weight gain: Secondary | ICD-10-CM | POA: Diagnosis not present

## 2016-06-18 MED ORDER — LIRAGLUTIDE -WEIGHT MANAGEMENT 18 MG/3ML ~~LOC~~ SOPN: 3.0000 mg | PEN_INJECTOR | Freq: Every day | SUBCUTANEOUS | 0 refills | Status: DC

## 2016-06-18 MED ORDER — LORCASERIN HCL ER 20 MG PO TB24: 1.0000 | ORAL_TABLET | Freq: Every day | ORAL | 3 refills | Status: DC

## 2016-06-18 NOTE — Assessment & Plan Note (Signed)
Has gained weight coming off of Saxenda and switching to Contrave from phentermine. We had to switch from [place] to Victoza because her insurance would not cover two weight loss medications. We will go back to Saxenda, continue Topamax. We will try to add Belviq this time. She is afraid of considering bariatrics for now, we will revisit this at a follow-up visit, have asked her to bring her mother with her.

## 2016-06-18 NOTE — Progress Notes (Signed)
  Subjective:    CC: Follow-up   HPI: Emma Boyer returns, she lost a great deal of weight on phentermine, Topamax, Saxenda initially, unfortunately when we discontinued her phentermine and try to get Contrave approved, she was unable to be on both Contrave and Saxenda, and stopped Saxenda. She was supposed to switch to Victoza. I did bring up bariatric surgery, she is afraid of this at this point.  Past medical history:  Negative.  See flowsheet/record as well for more information.  Surgical history: Negative.  See flowsheet/record as well for more information.  Family history: Negative.  See flowsheet/record as well for more information.  Social history: Negative.  See flowsheet/record as well for more information.  Allergies, and medications have been entered into the medical record, reviewed, and no changes needed.   Review of Systems: No fevers, chills, night sweats, weight loss, chest pain, or shortness of breath.   Objective:    General: Well Developed, well nourished, and in no acute distress.  Neuro: Alert and oriented x3, extra-ocular muscles intact, sensation grossly intact.  HEENT: Normocephalic, atraumatic, pupils equal round reactive to light, neck supple, no masses, no lymphadenopathy, thyroid nonpalpable.  Skin: Warm and dry, no rashes. Cardiac: Regular rate and rhythm, no murmurs rubs or gallops, no lower extremity edema.  Respiratory: Clear to auscultation bilaterally. Not using accessory muscles, speaking in full sentences.  Impression and Recommendations:    Abnormal weight gain Has gained weight coming off of Saxenda and switching to Contrave from phentermine. We had to switch from [place] to Victoza because her insurance would not cover two weight loss medications. We will go back to Saxenda, continue Topamax. We will try to add Belviq this time. She is afraid of considering bariatrics for now, we will revisit this at a follow-up visit, have asked her to bring her  mother with her.  I spent 25 minutes with this patient, greater than 50% was face-to-face time counseling regarding the above diagnoses

## 2016-06-29 ENCOUNTER — Telehealth: Payer: Self-pay | Admitting: *Deleted

## 2016-06-29 NOTE — Telephone Encounter (Signed)
Received PA for Belviq and Saxenda Spoke with Dr. Benjamin Stainhekkekandam and insurance is not going to cover patient being on multiple anitobesity medications at once. Per Dr. Benjamin Stainhekkekandam will try Belviq only.Submitted PA for Belviq. [id number] - PA Case ID: [id number]

## 2016-06-30 MED FILL — BELVIQ XR 20 MG TABLET: 20 | 30 days supply | Qty: 30 | Fill #0

## 2016-06-30 NOTE — Telephone Encounter (Signed)
Belviq has been approved for coverage for from 09/29/2016. Message left for patient and pharm.

## 2016-07-14 MED FILL — AMPICILLIN TR 500 MG CAP: 500 | 30 days supply | Qty: 60 | Fill #2

## 2016-07-14 MED FILL — MELOXICAM 15 MG TABLET: 15 | 30 days supply | Qty: 30 | Fill #3

## 2016-07-26 ENCOUNTER — Emergency Department
Admission: EM | Admit: 2016-07-26 | Discharge: 2016-07-26 | Disposition: A | Discharge status: Home / Self Care | Payer: 59 | Source: Home / Self Care | Attending: Family Medicine | Admitting: Family Medicine

## 2016-07-26 ENCOUNTER — Encounter: Payer: Self-pay | Admitting: *Deleted

## 2016-07-26 DIAGNOSIS — R300 Dysuria: Secondary | ICD-10-CM

## 2016-07-26 DIAGNOSIS — N3000 Acute cystitis without hematuria: Secondary | ICD-10-CM

## 2016-07-26 LAB — POCT URINALYSIS DIP (MANUAL ENTRY)
Bilirubin, UA: NEGATIVE
Glucose, UA: NEGATIVE
Ketones, POC UA: NEGATIVE
Nitrite, UA: NEGATIVE
Protein Ur, POC: NEGATIVE
Spec Grav, UA: 1.02 (ref 1.005–1.03)
Urobilinogen, UA: 0.2 (ref 0–1)
pH, UA: 8.5 (ref 5–8)

## 2016-07-26 MED ORDER — CEPHALEXIN 500 MG PO CAPS: 500.0000 mg | ORAL_CAPSULE | Freq: Two times a day (BID) | ORAL | 0 refills | Status: AC

## 2016-07-26 MED FILL — CEPHALEXIN 500 MG CAPSULE: 500 | 7 days supply | Qty: 14 | Fill #0

## 2016-07-26 NOTE — ED Triage Notes (Signed)
Patient c/o 1 week of dysuria and urgency. Afebrile. Taken AZO 2 days ago.

## 2016-07-26 NOTE — ED Provider Notes (Signed)
CSN: [id number]     Arrival date & time 07/26/16  1118 History   First MD Initiated Contact with Patient 07/26/16 1137     Chief Complaint  Patient presents with  . Dysuria   (Consider location/radiation/quality/duration/timing/severity/associated sxs/prior Treatment) HPI Anna GenreShelby L Shank is a 20 y.o. female presenting to UC with c/o 1 week of gradually worsening pain with urination and urinary frequency.  She did take Azo while at work 2 days ago and did have relief at that time. Last UTI was about 2 months ago.  She does not recall the medication she was on but notes keflex sounds familiar.  Denies fever, chills, n/v/d. Denies abdominal pain, back pain or urinary retention as she does have a hx of that in the past. Denies vaginal symptoms.    Past Medical History:  Diagnosis Date  . Bulging lumbar disc 2014  . HA (headache)   . Low back pain   . Neck pain   . Pneumonia   . Pseudopapilledema   . Shoulder pain   . Vision abnormalities    wears contacts, right eye worse   Past Surgical History:  Procedure Laterality Date  . None    . SUBOCCIPITAL CRANIECTOMY CERVICAL LAMINECTOMY N/A 05/01/2013   Procedure: Chiari Decompression;  Surgeon: Maeola HarmanJoseph Stern, MD;  Location: [facility];  Service: Neurosurgery;  Laterality: N/A;  Chiari Decompression   Family History  Problem Relation Age of Onset  . High blood pressure Father   . Arthritis Father    Social History  Substance Use Topics  . Smoking status: Never Smoker  . Smokeless tobacco: Never Used  . Alcohol use No   OB History    No data available     Review of Systems  Constitutional: Negative for chills and fever.  Gastrointestinal: Negative for abdominal pain, nausea and vomiting.  Genitourinary: Positive for dysuria, frequency and urgency. Negative for decreased urine volume, flank pain, menstrual problem, pelvic pain, vaginal bleeding, vaginal discharge and vaginal pain.  Musculoskeletal: Negative for back pain and  myalgias.    Allergies  Peanut-containing drug products  Home Medications   Prior to Admission medications   Medication Sig Start Date End Date Taking? Authorizing Provider  meloxicam (MOBIC) 15 MG tablet TAKE 1 TABLET BY MOUTH EVERY MORNING WITH BREAKFAST FOR 2 WEEKS THEN TAKE DAILY AS NEEDED FOR PAIN 03/18/16  Yes Monica Bectonhomas J Thekkekandam, MD  Naltrexone-Bupropion HCl ER (CONTRAVE) 8-90 MG TB12 Take by mouth.   Yes Historical Provider, MD  topiramate (TOPAMAX) 200 MG tablet TAKE 1 TABLET BY MOUTH DAILY 06/01/16  Yes Monica Bectonhomas J Thekkekandam, MD  cephALEXin (KEFLEX) 500 MG capsule Take 1 capsule (500 mg total) by mouth 2 (two) times daily. 07/26/16 08/02/16  Junius FinnerErin O'Malley, PA-C  Liraglutide -Weight Management (SAXENDA) 18 MG/3ML SOPN Inject 3 mg into the skin daily. 3 mg injected subcut daily 06/18/16   Monica Bectonhomas J Thekkekandam, MD  Lorcaserin HCl ER (BELVIQ XR) 20 MG TB24 Take 1 tablet by mouth daily. 06/18/16   Monica Bectonhomas J Thekkekandam, MD  NOVOFINE 30G X 8 MM MISC USE AS DIRECTED WITH SAXENDA 11/07/15   Monica Bectonhomas J Thekkekandam, MD  tiZANidine (ZANAFLEX) 4 MG tablet Take 1 tablet (4 mg total) by mouth Nightly. 08/29/15   Monica Bectonhomas J Thekkekandam, MD   Meds Ordered and Administered this Visit  Medications - No data to display  BP 111/73 (BP Location: Left Arm)   Pulse 91   Temp 97.9 F (36.6 C) (Oral)   Wt 275  lb (124.7 kg)   LMP 07/16/2016   SpO2 99%   BMI 43.07 kg/m  No data found.   Physical Exam  Constitutional: She is oriented to person, place, and time. She appears well-developed and well-nourished. No distress.  HENT:  Head: Normocephalic and atraumatic.  Mouth/Throat: Oropharynx is clear and moist.  Eyes: EOM are normal.  Neck: Normal range of motion.  Cardiovascular: Normal rate and regular rhythm.   Pulmonary/Chest: Effort normal and breath sounds normal. No respiratory distress. She has no wheezes. She has no rales.  Abdominal: Soft. She exhibits no distension. There is no  tenderness. There is no CVA tenderness.  Musculoskeletal: Normal range of motion.  Neurological: She is alert and oriented to person, place, and time.  Skin: Skin is warm and dry. She is not diaphoretic.  Psychiatric: She has a normal mood and affect. Her behavior is normal.  Nursing note and vitals reviewed.   Urgent Care Course   Clinical Course     Procedures (including critical care time)  Labs Review Labs Reviewed  POCT URINALYSIS DIP (MANUAL ENTRY) - Abnormal; Notable for the following:       Result Value   Clarity, UA cloudy (*)    Blood, UA trace-intact (*)    Leukocytes, UA moderate (2+) (*)    All other components within normal limits  URINE CULTURE    Imaging Review No results found.   MDM   1. Dysuria   2. Acute cystitis without hematuria    Pt c/o 1 week of urinary symptoms, c/w prior UTIs.  UA c/w UTI, will send culture. Rx: Keflex Encouraged to stay well hydrated. May continue to use OTC Azo if that helps with symptoms. F/u in 1 week if not improving.     Junius Finnerrin O'Malley, PA-C 07/26/16 1156

## 2016-07-27 LAB — URINE CULTURE: Organism ID, Bacteria: NO GROWTH

## 2016-07-28 ENCOUNTER — Telehealth: Payer: Self-pay | Admitting: Emergency Medicine

## 2016-07-28 NOTE — Telephone Encounter (Signed)
Inquired about patient's status; encourage them to call with questions/concerns.Encouraged her to complete antibiotics despite negative growth on urine culture.

## 2016-08-02 MED FILL — BELVIQ XR 20 MG TABLET: 20 | 30 days supply | Qty: 30 | Fill #1

## 2016-08-16 ENCOUNTER — Other Ambulatory Visit: Payer: Self-pay | Admitting: Sports Medicine

## 2016-08-16 DIAGNOSIS — M654 Radial styloid tenosynovitis [de Quervain]: Secondary | ICD-10-CM

## 2016-08-17 MED FILL — AMPICILLIN TR 500 MG CAP: 500 | 30 days supply | Qty: 60 | Fill #0

## 2016-08-17 MED FILL — MELOXICAM 15 MG TABLET: 15 | 30 days supply | Qty: 30 | Fill #0

## 2016-08-31 ENCOUNTER — Other Ambulatory Visit: Payer: Self-pay | Admitting: Sports Medicine

## 2016-08-31 MED FILL — TOPIRAMATE 200 MG TABLET: 200 | 90 days supply | Qty: 90 | Fill #0

## 2016-08-31 MED FILL — BELVIQ XR 20 MG TABLET: 20 | 30 days supply | Qty: 30 | Fill #2

## 2016-09-07 ENCOUNTER — Ambulatory Visit (INDEPENDENT_AMBULATORY_CARE_PROVIDER_SITE_OTHER): Payer: 59 | Admitting: Sports Medicine

## 2016-09-07 ENCOUNTER — Encounter: Payer: Self-pay | Admitting: Sports Medicine

## 2016-09-07 ENCOUNTER — Ambulatory Visit (INDEPENDENT_AMBULATORY_CARE_PROVIDER_SITE_OTHER): Payer: 59

## 2016-09-07 DIAGNOSIS — J209 Acute bronchitis, unspecified: Secondary | ICD-10-CM

## 2016-09-07 DIAGNOSIS — R05 Cough: Secondary | ICD-10-CM | POA: Diagnosis not present

## 2016-09-07 DIAGNOSIS — R0602 Shortness of breath: Secondary | ICD-10-CM | POA: Diagnosis not present

## 2016-09-07 DIAGNOSIS — J9811 Atelectasis: Secondary | ICD-10-CM

## 2016-09-07 DIAGNOSIS — R042 Hemoptysis: Secondary | ICD-10-CM | POA: Insufficient documentation

## 2016-09-07 MED ORDER — THERAFLU SEVERE COLD/CGH DAY 20-10-650 MG PO PACK: PACK | ORAL | 0 refills | Status: DC

## 2016-09-07 NOTE — Progress Notes (Signed)
  Subjective:    CC: Feeling sick  HPI: For the past 4-5 days this pleasant 21 year old female has had cough, low-grade fevers, mild muscle aches and body aches that have since resolved. Fatigue. Only minimal shortness of breath. Symptoms are overall improving.  Past medical history:  Negative.  See flowsheet/record as well for more information.  Surgical history: Negative.  See flowsheet/record as well for more information.  Family history: Negative.  See flowsheet/record as well for more information.  Social history: Negative.  See flowsheet/record as well for more information.  Allergies, and medications have been entered into the medical record, reviewed, and no changes needed.   Review of Systems: No fevers, chills, night sweats, weight loss, chest pain, or shortness of breath.   Objective:    General: Well Developed, well nourished, and in no acute distress.  Neuro: Alert and oriented x3, extra-ocular muscles intact, sensation grossly intact.  HEENT: Normocephalic, atraumatic, pupils equal round reactive to light, neck supple, no masses, no lymphadenopathy, thyroid nonpalpable. Oropharynx, nasopharynx, ear canals unremarkable  Skin: Warm and dry, no rashes. Cardiac: Regular rate and rhythm, no murmurs rubs or gallops, no lower extremity edema.  Respiratory: Clear to auscultation bilaterally. Not using accessory muscles, speaking in full sentences.  Impression and Recommendations:    Acute bronchitis Symptoms now present for about 5 days, symptomatic treatment only. I am going to get chest x-ray and she will keep close follow-up. No symptoms or risk factors for PE.  I spent 25 minutes with this patient, greater than 50% was face-to-face time counseling regarding the above diagnoses

## 2016-09-07 NOTE — Assessment & Plan Note (Signed)
Symptoms now present for about 5 days, symptomatic treatment only. I am going to get chest x-ray and she will keep close follow-up. No symptoms or risk factors for PE.

## 2016-09-07 NOTE — Patient Instructions (Signed)

## 2016-09-08 MED ORDER — AZITHROMYCIN 250 MG PO TABS: ORAL_TABLET | ORAL | 0 refills | Status: DC

## 2016-09-08 MED FILL — AZITHROMYCIN 250 MG TABLET: 250 | 5 days supply | Qty: 6 | Fill #0

## 2016-09-23 MED FILL — MELOXICAM 15 MG TABLET: 15 | 30 days supply | Qty: 30 | Fill #1

## 2016-09-23 MED FILL — AMPICILLIN TR 500 MG CAP: 500 | 30 days supply | Qty: 60 | Fill #1

## 2016-11-11 ENCOUNTER — Encounter (HOSPITAL_COMMUNITY): Payer: Self-pay | Admitting: Psychiatry

## 2016-11-11 ENCOUNTER — Ambulatory Visit (INDEPENDENT_AMBULATORY_CARE_PROVIDER_SITE_OTHER): Payer: 59 | Admitting: Psychiatry

## 2016-11-11 VITALS — BP 104/62 | HR 96 | Ht 67.25 in | Wt 301.0 lb

## 2016-11-11 DIAGNOSIS — Z818 Family history of other mental and behavioral disorders: Secondary | ICD-10-CM

## 2016-11-11 DIAGNOSIS — F331 Major depressive disorder, recurrent, moderate: Secondary | ICD-10-CM | POA: Diagnosis not present

## 2016-11-11 DIAGNOSIS — Z79899 Other long term (current) drug therapy: Secondary | ICD-10-CM | POA: Diagnosis not present

## 2016-11-11 MED ORDER — FLUOXETINE HCL 10 MG PO CAPS: 10.0000 mg | ORAL_CAPSULE | Freq: Every day | ORAL | 0 refills | Status: DC

## 2016-11-11 MED FILL — FLUoxetine HCL 10 MG CAPS: 10 | 90 days supply | Qty: 90 | Fill #0

## 2016-11-11 MED FILL — MELOXICAM 15 MG TABLET: 15 | 30 days supply | Qty: 30 | Fill #2

## 2016-11-11 MED FILL — AMPICILLIN TR 500 MG CAP: 500 | 30 days supply | Qty: 60 | Fill #2
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
## 2016-11-11 NOTE — Patient Instructions (Signed)
Start Prozac 10 mg daily for 1 week, then increase to 20 mg daily  Come back in about 4-5 weeks

## 2016-11-22 ENCOUNTER — Other Ambulatory Visit: Payer: Self-pay | Admitting: Sports Medicine

## 2016-11-22 DIAGNOSIS — G43009 Migraine without aura, not intractable, without status migrainosus: Secondary | ICD-10-CM

## 2016-11-22 MED FILL — tiZANidine HCL 4 MG TABS: 4 | 40 days supply | Qty: 40 | Fill #0

## 2016-12-06 ENCOUNTER — Other Ambulatory Visit: Payer: Self-pay | Admitting: Sports Medicine

## 2016-12-16 ENCOUNTER — Ambulatory Visit (INDEPENDENT_AMBULATORY_CARE_PROVIDER_SITE_OTHER): Payer: 59 | Admitting: Psychiatry

## 2016-12-16 ENCOUNTER — Encounter (HOSPITAL_COMMUNITY): Payer: Self-pay | Admitting: Psychiatry

## 2016-12-16 VITALS — BP 126/78 | HR 72 | Ht 67.0 in | Wt 306.4 lb

## 2016-12-16 DIAGNOSIS — F17200 Nicotine dependence, unspecified, uncomplicated: Secondary | ICD-10-CM

## 2016-12-16 DIAGNOSIS — Z79899 Other long term (current) drug therapy: Secondary | ICD-10-CM

## 2016-12-16 DIAGNOSIS — Z818 Family history of other mental and behavioral disorders: Secondary | ICD-10-CM | POA: Diagnosis not present

## 2016-12-16 DIAGNOSIS — F331 Major depressive disorder, recurrent, moderate: Secondary | ICD-10-CM

## 2016-12-16 MED ORDER — FLUOXETINE HCL 20 MG PO CAPS: 20.0000 mg | ORAL_CAPSULE | Freq: Every day | ORAL | 1 refills | Status: DC

## 2016-12-16 MED FILL — FLUoxetine HCL 20 MG CAPS: 20 | 90 days supply | Qty: 90 | Fill #0

## 2016-12-16 NOTE — Progress Notes (Signed)
BH MD/PA/NP OP Progress Note  12/16/2016 2:37 PM ASIANA BENNINGER  MRN:  [id number]  Chief Complaint: med check  Subjective:  Emma Boyer presents today for psychiatric follow-up. She reports that Prozac 20 mg is doing a great job for her anxiety and her mood. She feels like she is about 80% back to her usual self. She denies any significant depressive symptoms. She reports that she's had some inattention symptoms, and feels a little bit more "ADHD", but is not reticular really worried about this since she is off from school for the summer. She has a summer job working at [employer]. She will be moving back in with her parents for the summer.  denies any significant other concerns. She denies any significant side effects from Prozac. Denies any suicidal thoughts. Agrees to follow-up in 3 months before school.  Visit Diagnosis: No diagnosis found.  Past Psychiatric History: See intake H&P for full details. Reviewed, with no updates at this time.   Past Medical History:  Past Medical History:  Diagnosis Date  . Bulging lumbar disc 2014  . HA (headache)   . Low back pain   . Neck pain   . Pneumonia   . Pseudopapilledema   . Shoulder pain   . Vision abnormalities    wears contacts, right eye worse    Past Surgical History:  Procedure Laterality Date  . None    . SUBOCCIPITAL CRANIECTOMY CERVICAL LAMINECTOMY N/A 05/01/2013   Procedure: Chiari Decompression;  Surgeon: Maeola Harman, MD;  Location: [facility];  Service: Neurosurgery;  Laterality: N/A;  Chiari Decompression    Family Psychiatric History: See intake H&P for full details. Reviewed, with no updates at this time.   Family History:  Family History  Problem Relation Age of Onset  . High blood pressure Father   . Arthritis Father   . Anxiety disorder Mother   . Depression Mother   . Anxiety disorder Sister   . Depression Sister   . Physical abuse Sister   . Sexual abuse Sister   . ADD / ADHD Maternal Aunt    . Anxiety disorder Maternal Aunt   . Depression Maternal Aunt   . Anxiety disorder Maternal Grandfather   . Dementia Maternal Grandfather   . Depression Maternal Grandfather     Social History:  Social History   Social History  . Marital status: Single    Spouse name: N/A  . Number of children: 0  . Years of education: N/A   Occupational History  .      [employer]   Social History Main Topics  . Smoking status: Light Tobacco Smoker    Packs/day: 0.10    Years: 7.00  . Smokeless tobacco: Never Used     Comment: Smokes about 2 per day.  . Alcohol use 8.4 oz/week    7 Glasses of wine, 7 Cans of beer per week  . Drug use: No  . Sexual activity: Yes    Partners: Male    Birth control/ protection: Inserts   Other Topics Concern  . None   Social History Narrative   Patient lives at home with parents . 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    Right handed.   Caffeine- One cup of coffee daily and one soda.     Allergies:  Allergies  Allergen Reactions  . Peanut-Containing Drug Products Other (See Comments)    Childhood reaction    Metabolic  Disorder Labs: Lab Results  Component Value Date   HGBA1C 5.5 02/13/2014   MPG 111 02/13/2014   No results found for: PROLACTIN Lab Results  Component Value Date   CHOL 158 02/13/2014   TRIG 99 02/13/2014   HDL 49 02/13/2014   CHOLHDL 3.2 02/13/2014   VLDL 20 02/13/2014   LDLCALC 89 02/13/2014     Current Medications: Current Outpatient Prescriptions  Medication Sig Dispense Refill  . FLUoxetine (PROZAC) 10 MG capsule Take 1 capsule (10 mg total) by mouth daily. 90 capsule 0  . meloxicam (MOBIC) 15 MG tablet TAKE 1 TABLET BY MOUTH EVERY MORNING WITH BREAKFAST FOR 2 WEEKS THEN TAKE DAILY AS NEEDED FOR PAIN 30 tablet 3  . Omega-3 Fatty Acids (FISH OIL) 1000 MG CAPS Take by mouth.    Marland Kitchen. tiZANidine (ZANAFLEX) 4 MG tablet TAKE 1 TABLET BY MOUTH NIGHTLY 40 tablet 2  . topiramate (TOPAMAX) 200  MG tablet TAKE 1 TABLET BY MOUTH DAILY 90 tablet 0   No current facility-administered medications for this visit.     Neurologic: Headache: Negative Seizure: Negative Paresthesias: Negative  Musculoskeletal: Strength & Muscle Tone: within normal limits Gait & Station: normal Patient leans: N/A  Psychiatric Specialty Exam: ROS  Blood pressure 126/78, pulse 72, height 5\' 7"  (1.702 m), weight (!) 306 lb 6.4 oz (139 kg).Body mass index is 47.99 kg/m.  General Appearance: Casual and Well Groomed  Eye Contact:  Good  Speech:  Clear and Coherent  Volume:  Normal  Mood:  Euthymic  Affect:  Congruent  Thought Process:  Coherent  Orientation:  Full (Time, Place, and Person)  Thought Content: Logical   Suicidal Thoughts:  No  Homicidal Thoughts:  No  Memory:  Immediate;   Good  Judgement:  Good  Insight:  Good  Psychomotor Activity:  Normal  Concentration:  Concentration: Good  Recall:  Good  Fund of Knowledge: Good  Language: Good  Akathisia:  Negative  Handed:  Right  AIMS (if indicated):  0  Assets:  Communication Skills Desire for Improvement Financial Resources/Insurance Housing Leisure Time Physical Health Resilience Social Support Talents/Skills Transportation  ADL's:  Intact  Cognition: WNL  Sleep:  7-8 hours   Treatment Plan Summary: Emma GenreShelby L Hoeffner is a 21 year old female with a history of major depressive disorder, who presents today for psychiatric follow-up medication management. She has had a good response to Prozac 20 mg, now 5 weeks into treatment, and feels approximately 80% improved from her initial presentation. No acute safety issues, and can follow up in 3 months for a med check prior to restarting college.  1. Moderate episode of recurrent major depressive disorder (HCC)    Continue Prozac 20 mg daily Follow-up in 3 months I continue to encourage her to reduce her alcohol intake, she currently continues to drink about 2 glasses of wine, 5  nights a week  Burnard LeighAlexander Arya Nimrit Kehres, MD 12/16/2016, 2:37 PM

## 2016-12-21 MED FILL — TOPIRAMATE 200 MG TABLET: 200 | 90 days supply | Qty: 90 | Fill #0

## 2016-12-30 DIAGNOSIS — Z113 Encounter for screening for infections with a predominantly sexual mode of transmission: Secondary | ICD-10-CM | POA: Diagnosis not present

## 2016-12-30 DIAGNOSIS — Z01419 Encounter for gynecological examination (general) (routine) without abnormal findings: Secondary | ICD-10-CM | POA: Diagnosis not present

## 2016-12-30 DIAGNOSIS — Z6841 Body Mass Index (BMI) 40.0 and over, adult: Secondary | ICD-10-CM | POA: Diagnosis not present

## 2016-12-30 DIAGNOSIS — N760 Acute vaginitis: Secondary | ICD-10-CM | POA: Diagnosis not present

## 2016-12-31 MED FILL — metroNIDAZOLE 0.75 % GEL: 0.75 | 5 days supply | Qty: 70 | Fill #0

## 2016-12-31 MED FILL — metroNIDAZOLE 500 MG TABS: 500 | 1 days supply | Qty: 4 | Fill #0

## 2017-02-10 ENCOUNTER — Emergency Department
Admission: EM | Admit: 2017-02-10 | Discharge: 2017-02-10 | Disposition: A | Discharge status: Home / Self Care | Payer: 59 | Source: Home / Self Care | Attending: Emergency Medicine | Admitting: Emergency Medicine

## 2017-02-10 DIAGNOSIS — L739 Follicular disorder, unspecified: Secondary | ICD-10-CM | POA: Diagnosis not present

## 2017-02-10 MED ORDER — DOXYCYCLINE HYCLATE 100 MG PO CAPS: 100.0000 mg | ORAL_CAPSULE | Freq: Two times a day (BID) | ORAL | 0 refills | Status: DC

## 2017-02-10 MED FILL — DOXYCYCLINE HYCLATE 100 MG: 100 | 10 days supply | Qty: 20 | Fill #0

## 2017-02-10 NOTE — Discharge Instructions (Signed)
You have an infected ingrown hair/hair follicle in the pubic area. Take the doxycycline twice a day for 10 days. You may use over-the-counter antibacterial soap to wash the area twice a day. Also, may apply warm compresses twice a day. Or Tylenol if needed for pain. If it worsens, or if you run fever and have chills, return here or with your PCP or GYN to reevaluate

## 2017-02-10 NOTE — ED Provider Notes (Signed)
[facility]    CSN: [id number] Arrival date & time: 02/10/17  1305     History   Chief Complaint Chief Complaint  Patient presents with  . Abscess    HPI Emma Boyer is a 21 y.o. female.   HPI Pt stated that she has a bump in the pubic hair area that has been present for about a week. It may have started as a pimple but got larger and moderately uncomfortable to touch. 5 out of 10 sharp pain to direct pressure, 0 pain at rest. No drainage or bleeding. She states she occasionally has a history of ingrown hairs. Associated symptoms: Denies any acute vaginal symptoms. Started her menstrual period a couple days ago at the normal time. Otherwise no vaginal discharge or vaginal sores or symptoms. Denies fever or chills or nausea or vomiting or abdominal pain Past Medical History:  Diagnosis Date  . Bulging lumbar disc 2014  . HA (headache)   . Low back pain   . Neck pain   . Pneumonia   . Pseudopapilledema   . Shoulder pain   . Vision abnormalities    wears contacts, right eye worse    Patient Active Problem List   Diagnosis Date Noted  . Acute bronchitis 09/07/2016  . De Quervain's tenosynovitis, bilateral 11/21/2015  . History of migraine headaches 08/29/2015  . Abnormal weight gain 01/27/2015  . Migraine headache 01/27/2015  . Low back pain 04/20/2013  . Pseudopapilledema   . Shoulder pain   . 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  . Difficulty concentrating 09/08/2012  . Preventive measure 09/08/2012    Past Surgical History:  Procedure Laterality Date  . None    . SUBOCCIPITAL CRANIECTOMY CERVICAL LAMINECTOMY N/A 05/01/2013   Procedure: Chiari Decompression;  Surgeon: Maeola HarmanJoseph Stern, MD;  Location: [facility];  Service: Neurosurgery;  Laterality: N/A;  Chiari Decompression    OB History    No data available       Home Medications    Prior to Admission medications   Medication Sig Start Date End Date Taking? Authorizing Provider   doxycycline (VIBRAMYCIN) 100 MG capsule Take 1 capsule (100 mg total) by mouth 2 (two) times daily. For 10 days 02/10/17   Lajean ManesMassey, David, MD  FLUoxetine (PROZAC) 20 MG capsule Take 1 capsule (20 mg total) by mouth daily. 12/16/16   Burnard LeighEksir, Alexander Arya, MD  meloxicam (MOBIC) 15 MG tablet TAKE 1 TABLET BY MOUTH EVERY MORNING WITH BREAKFAST FOR 2 WEEKS THEN TAKE DAILY AS NEEDED FOR PAIN 08/17/16   Monica Bectonhekkekandam, Thomas J, MD  Omega-3 Fatty Acids (FISH OIL) 1000 MG CAPS Take by mouth.    [provider]  tiZANidine (ZANAFLEX) 4 MG tablet TAKE 1 TABLET BY MOUTH NIGHTLY 11/22/16   Monica Bectonhekkekandam, Thomas J, MD  topiramate (TOPAMAX) 200 MG tablet TAKE 1 TABLET BY MOUTH DAILY 12/06/16   Monica Bectonhekkekandam, Thomas J, MD    Family History Family History  Problem Relation Age of Onset  . High blood pressure Father   . Arthritis Father   . Anxiety disorder Mother   . Depression Mother   . Anxiety disorder Sister   . Depression Sister   . Physical abuse Sister   . Sexual abuse Sister   . ADD / ADHD Maternal Aunt   . Anxiety disorder Maternal Aunt   . Depression Maternal Aunt   . Anxiety disorder Maternal Grandfather   . Dementia Maternal Grandfather   . Depression Maternal Grandfather  Social History Social History  Substance Use Topics  . Smoking status: Light Tobacco Smoker    Packs/day: 0.10    Years: 7.00  . Smokeless tobacco: Never Used     Comment: Smokes about 2 per day.  . Alcohol use 8.4 oz/week    7 Glasses of wine, 7 Cans of beer per week     Allergies   Peanut-containing drug products   Review of Systems Review of Systems  All other systems reviewed and are negative.    Physical Exam Triage Vital Signs ED Triage Vitals  Enc Vitals Group     BP 02/10/17 1337 110/66     Pulse Rate 02/10/17 1337 86     Resp --      Temp 02/10/17 1337 98.4 F (36.9 C)     Temp Source 02/10/17 1337 Oral     SpO2 02/10/17 1337 96 %     Weight 02/10/17 1338 (!) 308 lb (139.7 kg)      Height --      Head Circumference --      Peak Flow --      Pain Score 02/10/17 1338 5     Pain Loc --      Pain Edu? --      Excl. in GC? --    No data found.   Updated Vital Signs BP 110/66 (BP Location: Left Arm)   Pulse 86   Temp 98.4 F (36.9 C) (Oral)   Wt (!) 308 lb (139.7 kg)   LMP 02/07/2017   SpO2 96%   BMI 48.24 kg/m   Visual Acuity Right Eye Distance:   Left Eye Distance:   Bilateral Distance:    Right Eye Near:   Left Eye Near:    Bilateral Near:     Physical Exam  Constitutional: She is oriented to person, place, and time. She appears well-developed and well-nourished. No distress.  HENT:  Head: Normocephalic and atraumatic.  Eyes: Pupils are equal, round, and reactive to light. No scleral icterus.  Neck: Normal range of motion. Neck supple.  Cardiovascular: Normal rate and regular rhythm.   Pulmonary/Chest: Effort normal.  Abdominal: She exhibits no distension.  Neurological: She is alert and oriented to person, place, and time. No cranial nerve deficit.  Skin: Skin is warm and dry.     Psychiatric: She has a normal mood and affect. Her behavior is normal.  Vitals reviewed.    UC Treatments / Results  Labs (all labs ordered are listed, but only abnormal results are displayed) Labs Reviewed - No data to display  EKG  EKG Interpretation None       Radiology No results found.  Procedures Procedures (including critical care time)  Medications Ordered in UC Medications - No data to display   Initial Impression / Assessment and Plan / UC Course  I have reviewed the triage vital signs and the nursing notes.  Pertinent labs & imaging results that were available during my care of the patient were reviewed by me and considered in my medical decision making (see chart for details).       Final Clinical Impressions(s) / UC Diagnoses   Final diagnoses:  Folliculitis  Of pubic hair area. No evidence of abscess, therefore, in my  opinion, I&D not indicated at this time.  Treatment options discussed, as well as risks, benefits, alternatives. Patient voiced understanding and agreement with the following plans: Use antibacterial soap to wash area twice a day. Warm compresses. Oral doxycycline.  Red flags discussed. Follow-up with your primary care doctor or gyn in 5-7 days if not improving, or sooner if symptoms become worse. Precautions discussed. Red flags discussed. An After Visit Summary was printed and given to the patient.  Questions invited and answered. Patient voiced understanding and agreement.   New Prescriptions Discharge Medication List as of 02/10/2017  2:35 PM    START taking these medications   Details  doxycycline (VIBRAMYCIN) 100 MG capsule Take 1 capsule (100 mg total) by mouth 2 (two) times daily. For 10 days, Starting Thu 02/10/2017, Normal         Lajean Manes, MD 02/10/17 1620

## 2017-02-10 NOTE — ED Triage Notes (Signed)
Pt stated that she has a bump in the perineum area that has been present for about a week.

## 2017-03-14 ENCOUNTER — Emergency Department (INDEPENDENT_AMBULATORY_CARE_PROVIDER_SITE_OTHER)
Admission: EM | Admit: 2017-03-14 | Discharge: 2017-03-14 | Disposition: A | Discharge status: Home / Self Care | Payer: 59 | Source: Home / Self Care | Attending: Family Medicine | Admitting: Family Medicine

## 2017-03-14 ENCOUNTER — Encounter: Payer: Self-pay | Admitting: *Deleted

## 2017-03-14 DIAGNOSIS — J069 Acute upper respiratory infection, unspecified: Secondary | ICD-10-CM | POA: Diagnosis not present

## 2017-03-14 DIAGNOSIS — Z30430 Encounter for insertion of intrauterine contraceptive device: Secondary | ICD-10-CM | POA: Diagnosis not present

## 2017-03-14 DIAGNOSIS — Z3202 Encounter for pregnancy test, result negative: Secondary | ICD-10-CM | POA: Diagnosis not present

## 2017-03-14 DIAGNOSIS — B9789 Other viral agents as the cause of diseases classified elsewhere: Secondary | ICD-10-CM

## 2017-03-14 DIAGNOSIS — H6502 Acute serous otitis media, left ear: Secondary | ICD-10-CM

## 2017-03-14 DIAGNOSIS — Z3046 Encounter for surveillance of implantable subdermal contraceptive: Secondary | ICD-10-CM | POA: Diagnosis not present

## 2017-03-14 MED ORDER — GUAIFENESIN-CODEINE 100-10 MG/5ML PO SOLN: ORAL | 0 refills | Status: AC

## 2017-03-14 MED ORDER — AMOXICILLIN 875 MG PO TABS: 875.0000 mg | ORAL_TABLET | Freq: Two times a day (BID) | ORAL | 0 refills | Status: DC

## 2017-03-14 MED FILL — VIRTUSSIN AC LIQUID: 100-10 | 12 days supply | Qty: 120 | Fill #0

## 2017-03-14 MED FILL — AMOXICILLIN 875 MG TABLET: 875 | 7 days supply | Qty: 14 | Fill #0

## 2017-03-14 NOTE — Discharge Instructions (Signed)
Take plain guaifenesin (1200mg  extended release tabs such as Mucinex) twice daily, with plenty of water, for cough and congestion.   Get adequate rest.   May use Afrin nasal spray (or generic oxymetazoline) each morning for about 5 days and then discontinue.  Also recommend using saline nasal spray several times daily and saline nasal irrigation (AYR is a common brand).  Use Flonase nasal spray each morning after using Afrin nasal spray and saline nasal irrigation. Try warm salt water gargles for sore throat.  Stop all antihistamines for now, and other non-prescription cough/cold preparations. May take Ibuprofen 200mg , 4 tabs every 8 hours with food for chest/sternum discomfort, sore throat, etc.   Follow-up with family doctor if not improving about10 days.

## 2017-03-14 NOTE — ED Provider Notes (Signed)
[facility]    CSN: [id number] Arrival date & time: 03/14/17  1316     History   Chief Complaint Chief Complaint  Patient presents with  . Cough    HPI Emma Boyer is a 21 y.o. female.   Patient complains of three day history of typical cold-like symptoms developing over several days, including mild sore throat, sinus congestion, headache, fatigue, and cough.    The history is provided by the patient.    Past Medical History:  Diagnosis Date  . Bulging lumbar disc 2014  . HA (headache)   . Low back pain   . Neck pain   . Pneumonia   . Pseudopapilledema   . Shoulder pain   . Vision abnormalities    wears contacts, right eye worse    Patient Active Problem List   Diagnosis Date Noted  . Acute bronchitis 09/07/2016  . De Quervain's tenosynovitis, bilateral 11/21/2015  . History of migraine headaches 08/29/2015  . Abnormal weight gain 01/27/2015  . Migraine headache 01/27/2015  . Low back pain 04/20/2013  . Pseudopapilledema   . Shoulder pain   . 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  . Difficulty concentrating 09/08/2012  . Preventive measure 09/08/2012    Past Surgical History:  Procedure Laterality Date  . None    . SUBOCCIPITAL CRANIECTOMY CERVICAL LAMINECTOMY N/A 05/01/2013   Procedure: Chiari Decompression;  Surgeon: Maeola HarmanJoseph Stern, MD;  Location: [facility];  Service: Neurosurgery;  Laterality: N/A;  Chiari Decompression    OB History    No data available       Home Medications    Prior to Admission medications   Medication Sig Start Date End Date Taking? Authorizing Provider  amoxicillin (AMOXIL) 875 MG tablet Take 1 tablet (875 mg total) by mouth 2 (two) times daily. 03/14/17   Lattie HawBeese, Stephen A, MD  FLUoxetine (PROZAC) 20 MG capsule Take 1 capsule (20 mg total) by mouth daily. 12/16/16   Burnard LeighEksir, Alexander Arya, MD  guaiFENesin-codeine 100-10 MG/5ML syrup Take 10mL by mouth at bedtime as needed for cough 03/14/17 04/14/17   Lattie HawBeese, Stephen A, MD  meloxicam (MOBIC) 15 MG tablet TAKE 1 TABLET BY MOUTH EVERY MORNING WITH BREAKFAST FOR 2 WEEKS THEN TAKE DAILY AS NEEDED FOR PAIN 08/17/16   Monica Bectonhekkekandam, Thomas J, MD  Omega-3 Fatty Acids (FISH OIL) 1000 MG CAPS Take by mouth.    [provider]  tiZANidine (ZANAFLEX) 4 MG tablet TAKE 1 TABLET BY MOUTH NIGHTLY 11/22/16   Monica Bectonhekkekandam, Thomas J, MD  topiramate (TOPAMAX) 200 MG tablet TAKE 1 TABLET BY MOUTH DAILY 12/06/16   Monica Bectonhekkekandam, Thomas J, MD    Family History Family History  Problem Relation Age of Onset  . High blood pressure Father   . Arthritis Father   . Anxiety disorder Mother   . Depression Mother   . Anxiety disorder Sister   . Depression Sister   . Physical abuse Sister   . Sexual abuse Sister   . ADD / ADHD Maternal Aunt   . Anxiety disorder Maternal Aunt   . Depression Maternal Aunt   . Anxiety disorder Maternal Grandfather   . Dementia Maternal Grandfather   . Depression Maternal Grandfather     Social History Social History  Substance Use Topics  . Smoking status: Light Tobacco Smoker    Packs/day: 0.10    Years: 7.00  . Smokeless tobacco: Never Used     Comment: Smokes about 2 per day.  . Alcohol  use 8.4 oz/week    7 Glasses of wine, 7 Cans of beer per week     Allergies   Peanut-containing drug products   Review of Systems Review of Systems  + sore throat + cough No pleuritic pain + wheezing + nasal congestion + post-nasal drainage No sinus pain/pressure No itchy/red eyes ? earache No hemoptysis No SOB No fever, + chills No nausea No vomiting No abdominal pain No diarrhea No urinary symptoms No skin rash + fatigue + myalgias + headache Used OTC meds without relief    Physical Exam Triage Vital Signs ED Triage Vitals  Enc Vitals Group     BP 03/14/17 1335 (!) 146/100     Pulse Rate 03/14/17 1335 87     Resp 03/14/17 1335 16     Temp 03/14/17 1335 97.8 F (36.6 C)     Temp Source 03/14/17 1335  Oral     SpO2 03/14/17 1335 98 %     Weight 03/14/17 1338 (!) 311 lb (141.1 kg)     Height 03/14/17 1338 5\' 7"  (1.702 m)     Head Circumference --      Peak Flow --      Pain Score 03/14/17 1338 0     Pain Loc --      Pain Edu? --      Excl. in GC? --    No data found.   Updated Vital Signs BP (!) 146/100 (BP Location: Left Arm)   Pulse 87   Temp 97.8 F (36.6 C) (Oral)   Resp 16   Ht 5\' 7"  (1.702 m)   Wt (!) 311 lb (141.1 kg)   LMP 02/28/2017   SpO2 98%   BMI 48.71 kg/m   Visual Acuity Right Eye Distance:   Left Eye Distance:   Bilateral Distance:    Right Eye Near:   Left Eye Near:    Bilateral Near:     Physical Exam Nursing notes and Vital Signs reviewed. Appearance:  Patient appears stated age, and in no acute distress Eyes:  Pupils are equal, round, and reactive to light and accomodation.  Extraocular movement is intact.  Conjunctivae are not inflamed  Ears:  Canals normal.  Left tympanic membrane has serous effusion.  Right tympanic membrane normal Nose:  Mildly congested turbinates.  No sinus tenderness.   Pharynx:  Normal Neck:  Supple.  Enlarged posterior/lateral nodes are palpated bilaterally, tender to palpation on the left.   Lungs:  Clear to auscultation.  Breath sounds are equal.  Moving air well. Heart:  Regular rate and rhythm without murmurs, rubs, or gallops.  Abdomen:  Nontender without masses or hepatosplenomegaly.  Bowel sounds are present.  No CVA or flank tenderness.  Extremities:  No edema.  Skin:  No rash present.    UC Treatments / Results  Labs (all labs ordered are listed, but only abnormal results are displayed) Labs Reviewed - No data to display  EKG  EKG Interpretation None       Radiology No results found.  Procedures Procedures (including critical care time)  Medications Ordered in UC Medications - No data to display   Initial Impression / Assessment and Plan / UC Course  I have reviewed the triage vital signs  and the nursing notes.  Pertinent labs & imaging results that were available during my care of the patient were reviewed by me and considered in my medical decision making (see chart for details).    Begin amoxicillin 875mg   BID Rx for Robitussin AC for night time cough.  Take plain guaifenesin (1200mg  extended release tabs such as Mucinex) twice daily, with plenty of water, for cough and congestion.   Get adequate rest.   May use Afrin nasal spray (or generic oxymetazoline) each morning for about 5 days and then discontinue.  Also recommend using saline nasal spray several times daily and saline nasal irrigation (AYR is a common brand).  Use Flonase nasal spray each morning after using Afrin nasal spray and saline nasal irrigation. Try warm salt water gargles for sore throat.  Stop all antihistamines for now, and other non-prescription cough/cold preparations. May take Ibuprofen 200mg , 4 tabs every 8 hours with food for chest/sternum discomfort, sore throat, etc.   Follow-up with family doctor if not improving about10 days.     Final Clinical Impressions(s) / UC Diagnoses   Final diagnoses:  Viral URI with cough  Acute serous otitis media of left ear, recurrence not specified    New Prescriptions New Prescriptions   AMOXICILLIN (AMOXIL) 875 MG TABLET    Take 1 tablet (875 mg total) by mouth 2 (two) times daily.   GUAIFENESIN-CODEINE 100-10 MG/5ML SYRUP    Take 10mL by mouth at bedtime as needed for cough     Lattie Haw, MD 03/22/17 1146

## 2017-03-14 NOTE — ED Triage Notes (Signed)
Pt c/o sore throat, body aches and productive cough x 2 days. Denies fever. She has taken Nyquil, Dayquil, and IBF.

## 2017-03-17 ENCOUNTER — Ambulatory Visit (INDEPENDENT_AMBULATORY_CARE_PROVIDER_SITE_OTHER): Payer: 59 | Admitting: Psychiatry

## 2017-03-17 ENCOUNTER — Encounter (HOSPITAL_COMMUNITY): Payer: Self-pay | Admitting: Psychiatry

## 2017-03-17 DIAGNOSIS — F331 Major depressive disorder, recurrent, moderate: Secondary | ICD-10-CM

## 2017-03-17 MED ORDER — FLUOXETINE HCL 40 MG PO CAPS: 40.0000 mg | ORAL_CAPSULE | Freq: Every day | ORAL | 1 refills | Status: DC

## 2017-03-17 MED FILL — FLUoxetine HCL 40 MG CAPS: 40 | 90 days supply | Qty: 90 | Fill #0

## 2017-03-17 NOTE — Progress Notes (Signed)
[facility] MD/PA/NP OP Progress Note  03/17/2017 1:21 PM Emma Boyer  MRN:  [id number]  Chief Complaint:  Subjective:  Emma Boyer presents today for medication management follow-up for depression and anxiety. She reports that she has had some rebound symptoms of her mood, and anxiety and wonders about increasing Prozac to 40 mg daily. We spent time discussing the risks and benefits of this. We also spent time catching up on some of her life changes recently related to a new apartment, new roommate, and restarting school in 2 weeks.  She reports that she continues to drink alcohol, proximally 1-2 beers 5 days out of the week. Spent time discussing moderation of alcohol use, which she shares frankly that she does not feel her alcohol use is a problem. Encouraged her to keep monitoring this and we can discuss further follow-up.  She denies any acute safety issues, and agrees to follow-up in 2 months.  Visit Diagnosis:    ICD-10-CM   1. Moderate episode of recurrent major depressive disorder (HCC) F33.1 FLUoxetine (PROZAC) 40 MG capsule    Past Psychiatric History: See intake H&P for full details. Reviewed, with no updates at this time.   Past Medical History:  Past Medical History:  Diagnosis Date  . Bulging lumbar disc 2014  . HA (headache)   . Low back pain   . Neck pain   . Pneumonia   . Pseudopapilledema   . Shoulder pain   . Vision abnormalities    wears contacts, right eye worse    Past Surgical History:  Procedure Laterality Date  . None    . SUBOCCIPITAL CRANIECTOMY CERVICAL LAMINECTOMY N/A 05/01/2013   Procedure: Chiari Decompression;  Surgeon: Maeola HarmanJoseph Stern, MD;  Location: [facility];  Service: Neurosurgery;  Laterality: N/A;  Chiari Decompression    Family Psychiatric History: See intake H&P for full details. Reviewed, with no updates at this time.   Family History:  Family History  Problem Relation Age of Onset  . High blood pressure Father   . Arthritis Father    . Anxiety disorder Mother   . Depression Mother   . Anxiety disorder Sister   . Depression Sister   . Physical abuse Sister   . Sexual abuse Sister   . ADD / ADHD Maternal Aunt   . Anxiety disorder Maternal Aunt   . Depression Maternal Aunt   . Anxiety disorder Maternal Grandfather   . Dementia Maternal Grandfather   . Depression Maternal Grandfather     Social History:  Social History   Social History  . Marital status: Single    Spouse name: N/A  . Number of children: 0  . Years of education: N/A   Occupational History  .      [employer]   Social History Main Topics  . Smoking status: Former Smoker    Packs/day: 0.10    Years: 7.00  . Smokeless tobacco: Never Used     Comment: Smokes about 2 per day.  . Alcohol use 8.4 oz/week    7 Glasses of wine, 7 Cans of beer per week  . Drug use: No  . Sexual activity: Yes    Partners: Male    Birth control/ protection: Inserts   Other Topics Concern  . None   Social History Narrative   Patient lives at home with parents . Patient is a high [employer] student. Patient works at [employer].    Right handed.   Caffeine- One cup of  coffee daily and one soda.     Allergies:  Allergies  Allergen Reactions  . Peanut-Containing Drug Products Other (See Comments)    Childhood reaction    Metabolic Disorder Labs: Lab Results  Component Value Date   HGBA1C 5.5 02/13/2014   MPG 111 02/13/2014   No results found for: PROLACTIN Lab Results  Component Value Date   CHOL 158 02/13/2014   TRIG 99 02/13/2014   HDL 49 02/13/2014   CHOLHDL 3.2 02/13/2014   VLDL 20 02/13/2014   LDLCALC 89 02/13/2014     Current Medications: Current Outpatient Prescriptions  Medication Sig Dispense Refill  . amoxicillin (AMOXIL) 875 MG tablet Take 1 tablet (875 mg total) by mouth 2 (two) times daily. 14 tablet 0  . FLUoxetine (PROZAC) 40 MG capsule Take 1 capsule (40 mg total) by mouth daily. 90 capsule 1  . guaiFENesin-codeine 100-10  MG/5ML syrup Take 10mL by mouth at bedtime as needed for cough 120 mL 0  . Omega-3 Fatty Acids (FISH OIL) 1000 MG CAPS Take by mouth.    Marland Kitchen. tiZANidine (ZANAFLEX) 4 MG tablet TAKE 1 TABLET BY MOUTH NIGHTLY 40 tablet 2  . topiramate (TOPAMAX) 200 MG tablet TAKE 1 TABLET BY MOUTH DAILY 90 tablet 0   No current facility-administered medications for this visit.     Neurologic: Headache: Negative Seizure: Negative Paresthesias: Negative  Musculoskeletal: Strength & Muscle Tone: within normal limits Gait & Station: normal Patient leans: N/A  Psychiatric Specialty Exam: ROS  Blood pressure 126/70, pulse 83, height 5\' 7"  (1.702 m), weight (!) 305 lb 9.6 oz (138.6 kg), last menstrual period 02/28/2017, SpO2 98 %.Body mass index is 47.86 kg/m.  General Appearance: Casual and Fairly Groomed  Eye Contact:  Good  Speech:  Clear and Coherent  Volume:  Normal  Mood:  Dysphoric  Affect:  Appropriate and Congruent  Thought Process:  Goal Directed  Orientation:  Full (Time, Place, and Person)  Thought Content: Logical   Suicidal Thoughts:  No  Homicidal Thoughts:  No  Memory:  Immediate;   Good  Judgement:  Good  Insight:  Fair  Psychomotor Activity:  Normal  Concentration:  Attention Span: Good  Recall:  Good  Fund of Knowledge: Good  Language: Good  Akathisia:  Negative  Handed:  Right  AIMS (if indicated):  0  Assets:  Communication Skills Desire for Improvement Financial Resources/Insurance Housing Vocational/Educational  ADL's:  Intact  Cognition: WNL  Sleep:  7 hours    Treatment Plan Summary: Emma Boyer is a 21 year old female with a history of major depressive disorder, and some difficulties with focus and concentration. She presents today for medication management follow-up. I suspect a significant contributor eating factor for her concentration is related to her mood and anxiety symptoms. We agreed to titrate fluoxetine to 40 mg daily and follow-up in 2 months.  No  acute safety issues at this time.  1. Moderate episode of recurrent major depressive disorder (HCC)    Increase Prozac to 40 mg daily Return to clinic in 2 months  Burnard LeighAlexander Arya Eksir, MD 03/17/2017, 1:21 PM

## 2017-04-25 ENCOUNTER — Ambulatory Visit (INDEPENDENT_AMBULATORY_CARE_PROVIDER_SITE_OTHER): Payer: 59 | Admitting: Sports Medicine

## 2017-04-25 ENCOUNTER — Encounter: Payer: Self-pay | Admitting: Sports Medicine

## 2017-04-25 DIAGNOSIS — M26621 Arthralgia of right temporomandibular joint: Secondary | ICD-10-CM | POA: Diagnosis not present

## 2017-04-25 DIAGNOSIS — M26629 Arthralgia of temporomandibular joint, unspecified side: Secondary | ICD-10-CM | POA: Insufficient documentation

## 2017-04-25 MED ORDER — PREDNISONE 50 MG PO TABS: ORAL_TABLET | ORAL | 0 refills | Status: DC

## 2017-04-25 MED FILL — predniSONE 50 MG TABS: 50 | 5 days supply | Qty: 5 | Fill #0

## 2017-04-25 NOTE — Progress Notes (Signed)
  Subjective:    CC: Right-sided jaw pain  HPI: This is a 21 year old Female, for the past several days she's had pain in the right side of her jaw, no TMJ, worse with opening and closing her mouth, occasional pops and clicks, note from a. Symptoms are moderate, persistent.  Past medical history:  Negative.  See flowsheet/record as well for more information.  Surgical history: Negative.  See flowsheet/record as well for more information.  Family history: Negative.  See flowsheet/record as well for more information.  Social history: Negative.  See flowsheet/record as well for more information.  Allergies, and medications have been entered into the medical record, reviewed, and no changes needed.   Review of Systems: No fevers, chills, night sweats, weight loss, chest pain, or shortness of breath.   Objective:    General: Well Developed, well nourished, and in no acute distress.  Neuro: Alert and oriented x3, extra-ocular muscles intact, sensation grossly intact.  HEENT: Normocephalic, atraumatic, pupils equal round reactive to light, neck supple, no masses, no lymphadenopathy, thyroid nonpalpable. Oropharynx, nasopharynx, ear canals unremarkable, there is tenderness over the right temporomandibular joint without palpable mechanical symptoms. Skin: Warm and dry, no rashes. Cardiac: Regular rate and rhythm, no murmurs rubs or gallops, no lower extremity edema.  Respiratory: Clear to auscultation bilaterally. Not using accessory muscles, speaking in full sentences.  Impression and Recommendations:    Temporomandibular joint arthralgia, right Right TMJ dysfunction.  Mechanical symptoms suggesting some disruption of the intra-articular disc. Prednisone to calm things down, I would like her to see her dentist to discuss custom mouthguard, I think we can do this before considering injection into the TMJ.  ___________________________________________ Ihor Austinhomas J. Benjamin Stainhekkekandam, M.D., ABFM.,  CAQSM. Primary Care and Sports Medicine [facility]  Adjunct Instructor of Family Medicine  [employer]

## 2017-04-25 NOTE — Patient Instructions (Signed)
Temporomandibular Joint Syndrome Temporomandibular joint (TMJ) syndrome is a condition that affects the joints between your jaw and your skull. The TMJs are located near your ears and allow your jaw to open and close. These joints and the nearby muscles are involved in all movements of the jaw. People with TMJ syndrome have pain in the area of these joints and muscles. Chewing, biting, or other movements of the jaw can be difficult or painful. TMJ syndrome can be caused by various things. In many cases, the condition is mild and goes away within a few weeks. For some people, the condition can become a long-term problem. What are the causes? Possible causes of TMJ syndrome include:  Grinding your teeth or clenching your jaw. Some people do this when they are under stress.  Arthritis.  Injury to the jaw.  Head or neck injury.  Teeth or dentures that are not aligned well.  In some cases, the cause of TMJ syndrome may not be known. What are the signs or symptoms? The most common symptom is an aching pain on the side of the head in the area of the TMJ. Other symptoms may include:  Pain when moving your jaw, such as when chewing or biting.  Being unable to open your jaw all the way.  Making a clicking sound when you open your mouth.  Headache.  Earache.  Neck or shoulder pain.  How is this diagnosed? Diagnosis can usually be made based on your symptoms, your medical history, and a physical exam. Your health care provider may check the range of motion of your jaw. Imaging tests, such as X-rays or an MRI, are sometimes done. You may need to see your dentist to determine if your teeth and jaw are lined up correctly. How is this treated? TMJ syndrome often goes away on its own. If treatment is needed, the options may include:  Eating soft foods and applying ice or heat.  Medicines to relieve pain or inflammation.  Medicines to relax the muscles.  A splint, bite plate, or mouthpiece  to prevent teeth grinding or jaw clenching.  Relaxation techniques or counseling to help reduce stress.  Transcutaneous electrical nerve stimulation (TENS). This helps to relieve pain by applying an electrical current through the skin.  Acupuncture. This is sometimes helpful to relieve pain.  Jaw surgery. This is rarely needed.  Follow these instructions at home:  Take medicines only as directed by your health care provider.  Eat a soft diet if you are having trouble chewing.  Apply ice to the painful area. ? Put ice in a plastic bag. ? Place a towel between your skin and the bag. ? Leave the ice on for 20 minutes, 2-3 times a day.  Apply a warm compress to the painful area as directed.  Massage your jaw area and perform any jaw stretching exercises as recommended by your health care provider.  If you were given a mouthpiece or bite plate, wear it as directed.  Avoid foods that require a lot of chewing. Do not chew gum.  Keep all follow-up visits as directed by your health care provider. This is important. Contact a health care provider if:  You are having trouble eating.  You have new or worsening symptoms. Get help right away if:  Your jaw locks open or closed. This information is not intended to replace advice given to you by your health care provider. Make sure you discuss any questions you have with your health care provider. Document   Released: 04/27/2001 Document Revised: 04/01/2016 Document Reviewed: 03/07/2014 Elsevier Interactive Patient Education  2018 Elsevier Inc.  

## 2017-04-25 NOTE — Assessment & Plan Note (Signed)
Right TMJ dysfunction.  Mechanical symptoms suggesting some disruption of the intra-articular disc. Prednisone to calm things down, I would like her to see her dentist to discuss custom mouthguard, I think we can do this before considering injection into the TMJ.

## 2017-05-10 ENCOUNTER — Other Ambulatory Visit: Payer: Self-pay | Admitting: Sports Medicine

## 2017-05-10 MED FILL — TOPIRAMATE 200 MG TABLET: 200 | 90 days supply | Qty: 90 | Fill #0

## 2017-05-10 MED FILL — tiZANidine HCL 4 MG TABS: 4 | 40 days supply | Qty: 40 | Fill #1

## 2017-05-18 ENCOUNTER — Encounter (HOSPITAL_COMMUNITY): Payer: Self-pay | Admitting: Psychiatry

## 2017-05-18 ENCOUNTER — Ambulatory Visit (INDEPENDENT_AMBULATORY_CARE_PROVIDER_SITE_OTHER): Payer: 59 | Admitting: Psychiatry

## 2017-05-18 VITALS — BP 128/83 | HR 104 | Ht 67.0 in | Wt 319.8 lb

## 2017-05-18 DIAGNOSIS — Z818 Family history of other mental and behavioral disorders: Secondary | ICD-10-CM | POA: Diagnosis not present

## 2017-05-18 DIAGNOSIS — F902 Attention-deficit hyperactivity disorder, combined type: Secondary | ICD-10-CM | POA: Diagnosis not present

## 2017-05-18 DIAGNOSIS — F3340 Major depressive disorder, recurrent, in remission, unspecified: Secondary | ICD-10-CM | POA: Diagnosis not present

## 2017-05-18 DIAGNOSIS — Z87891 Personal history of nicotine dependence: Secondary | ICD-10-CM | POA: Diagnosis not present

## 2017-05-18 DIAGNOSIS — F331 Major depressive disorder, recurrent, moderate: Secondary | ICD-10-CM

## 2017-05-18 DIAGNOSIS — Z79899 Other long term (current) drug therapy: Secondary | ICD-10-CM

## 2017-05-18 NOTE — Progress Notes (Signed)
BH MD/PA/NP OP Progress Note  05/18/2017 10:10 AM Emma Boyer  MRN:  [id number]  Chief Complaint:  Chief Complaint    Follow-up     HPI: Emma Boyer presents for medication management follow-up. She reports that her mood and anxiety are doing well. She reports remission of her depressive symptoms. Reports that things are going well with her roommates. Her major concern is related to ongoing difficulties with attention, and feeling scatterbrained in terms of getting her tasks done. She was agreeable to an ADHD testing referral, and we will schedule follow-up in 3 months or sooner if needed. No acute safety issues, and she has decreased her alcohol use over the past couple months, partially motivated by her goal to save money.  Visit Diagnosis: No diagnosis found.  Past Psychiatric History: See intake H&P for full details. Reviewed, with no updates at this time.   Past Medical History:  Past Medical History:  Diagnosis Date  . Bulging lumbar disc 2014  . HA (headache)   . Low back pain   . Neck pain   . Pneumonia   . Pseudopapilledema   . Shoulder pain   . Vision abnormalities    wears contacts, right eye worse    Past Surgical History:  Procedure Laterality Date  . None    . SUBOCCIPITAL CRANIECTOMY CERVICAL LAMINECTOMY N/A 05/01/2013   Procedure: Chiari Decompression;  Surgeon: Maeola Harman, MD;  Location: [facility];  Service: Neurosurgery;  Laterality: N/A;  Chiari Decompression    Family Psychiatric History: See intake H&P for full details. Reviewed, with no updates at this time.   Family History:  Family History  Problem Relation Age of Onset  . High blood pressure Father   . Arthritis Father   . Anxiety disorder Mother   . Depression Mother   . Anxiety disorder Sister   . Depression Sister   . Physical abuse Sister   . Sexual abuse Sister   . ADD / ADHD Maternal Aunt   . Anxiety disorder Maternal Aunt   . Depression Maternal Aunt   . Anxiety  disorder Maternal Grandfather   . Dementia Maternal Grandfather   . Depression Maternal Grandfather     Social History:  Social History   Social History  . Marital status: Single    Spouse name: N/A  . Number of children: 0  . Years of education: N/A   Occupational History  .      [employer]   Social History Main Topics  . Smoking status: Former Smoker    Packs/day: 0.10    Years: 7.00    Types: Cigarettes    Quit date: 03/18/2017  . Smokeless tobacco: Never Used     Comment: Smokes about 2 per day.  . Alcohol use 8.4 oz/week    7 Glasses of wine, 7 Cans of beer per week  . Drug use: No  . Sexual activity: Yes    Partners: Male    Birth control/ protection: IUD   Other Topics Concern  . None   Social History Narrative   Patient lives at home with parents . 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    Right handed.   Caffeine- One cup of coffee daily and one soda.     Allergies:  Allergies  Allergen Reactions  . Peanut-Containing Drug Products Other (See Comments)    Childhood reaction    Metabolic Disorder Labs: Lab Results  Component Value Date  HGBA1C 5.5 02/13/2014   MPG 111 02/13/2014   No results found for: PROLACTIN Lab Results  Component Value Date   CHOL 158 02/13/2014   TRIG 99 02/13/2014   HDL 49 02/13/2014   CHOLHDL 3.2 02/13/2014   VLDL 20 02/13/2014   LDLCALC 89 02/13/2014   Lab Results  Component Value Date   TSH 1.154 02/13/2014    Therapeutic Level Labs: No results found for: LITHIUM No results found for: VALPROATE No components found for:  CBMZ  Current Medications: Current Outpatient Prescriptions  Medication Sig Dispense Refill  . FLUoxetine (PROZAC) 40 MG capsule Take 1 capsule (40 mg total) by mouth daily. 90 capsule 1  . Omega-3 Fatty Acids (FISH OIL) 1000 MG CAPS Take by mouth.    . predniSONE (DELTASONE) 50 MG tablet One tab PO daily for 5 days. 5 tablet 0  . tiZANidine (ZANAFLEX) 4 MG  tablet TAKE 1 TABLET BY MOUTH NIGHTLY 40 tablet 2  . topiramate (TOPAMAX) 200 MG tablet TAKE 1 TABLET BY MOUTH DAILY 90 tablet 0   No current facility-administered medications for this visit.      Musculoskeletal: Strength & Muscle Tone: within normal limits Gait & Station: normal Patient leans: N/A  Psychiatric Specialty Exam: Review of Systems  Constitutional: Negative.   HENT: Negative.   Respiratory: Negative.   Cardiovascular: Negative.   Gastrointestinal: Negative.   Musculoskeletal: Negative.   Neurological: Negative.   Psychiatric/Behavioral: Negative.     Blood pressure 128/83, pulse (!) 104, height  (1.702 m), weight (!) 319 lb 12.8 oz (145.1 kg).Body mass index is 50.09 kg/m.  General Appearance: Casual and Fairly Groomed  Eye Contact:  Good  Speech:  Clear and Coherent  Volume:  Normal  Mood:  Euthymic  Affect:  Appropriate and Congruent  Thought Process:  Goal Directed  Orientation:  Full (Time, Place, and Person)  Thought Content: Logical   Suicidal Thoughts:  No  Homicidal Thoughts:  No  Memory:  Immediate;   Fair  Judgement:  Fair  Insight:  Shallow  Psychomotor Activity:  Normal  Concentration:  Concentration: Good  Recall:  Good  Fund of Knowledge: Good  Language: Good  Akathisia:  Negative  Handed:  Right  AIMS (if indicated): not done  Assets:  Communication Skills Desire for Improvement Financial Resources/Insurance Housing Leisure Time Physical Health Resilience Social Support Talents/Skills Transportation Vocational/Educational  ADL's:  Intact  Cognition: WNL  Sleep:  Good   Screenings: PHQ2-9     Office Visit from 04/25/2017 in [facility] [place]  PHQ-2 Total Score  0       Assessment and Plan: Emma Boyer is a 21 year old female with a history of major depressive disorder, currently in remission. Her primary concern today is requesting testing for ADHD. She was agreeable to a referral  for such testing and will follow up in 3 months.  1. Attention deficit hyperactivity disorder (ADHD), combined type   2. Moderate episode of recurrent major depressive disorder (HCC)    Continue Prozac 40 mg daily Return to clinic in 3 months  I spent 15 minutes with the patient and direct care and counseling in discussing her treatment plan and follow-up options.  Burnard Leigh, MD 05/18/2017, 10:10 AM

## 2017-05-23 ENCOUNTER — Ambulatory Visit (INDEPENDENT_AMBULATORY_CARE_PROVIDER_SITE_OTHER): Payer: 59

## 2017-05-23 ENCOUNTER — Ambulatory Visit (INDEPENDENT_AMBULATORY_CARE_PROVIDER_SITE_OTHER): Payer: 59 | Admitting: Sports Medicine

## 2017-05-23 DIAGNOSIS — R042 Hemoptysis: Secondary | ICD-10-CM

## 2017-05-23 MED ORDER — AZITHROMYCIN 250 MG PO TABS: ORAL_TABLET | ORAL | 0 refills | Status: DC

## 2017-05-23 MED ORDER — HYDROCOD POLST-CPM POLST ER 10-8 MG/5ML PO SUER: 5.0000 mL | Freq: Two times a day (BID) | ORAL | 0 refills | Status: DC | PRN

## 2017-05-23 MED FILL — HYDROCODONE-CHLORPHENIRAM S: 10-8 | 12 days supply | Qty: 120 | Fill #0

## 2017-05-23 MED FILL — AZITHROMYCIN 250 MG TAB: 250 | 5 days supply | Qty: 6 | Fill #0

## 2017-05-23 NOTE — Assessment & Plan Note (Signed)
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 Chest x-ray, with hemoptysis we are going to add azithromycin, Tussionex to abort trauma to the airways from coughing. She will absolutely return in one week if no better, at that point we would consider a CT scan and referral to ENT for laryngoscopy.

## 2017-05-23 NOTE — Progress Notes (Signed)
  Subjective:    CC: Hemoptysis  HPI: For the last week this pleasant 21 year old female has had sore throat, cough, low-grade fevers, she developed a small non-of blood in her sputum with coughing. Doesn't really have any shortness of breath or chest pain. Symptoms are moderate, persistent.  Past medical history:  Negative.  See flowsheet/record as well for more information.  Surgical history: Negative.  See flowsheet/record as well for more information.  Family history: Negative.  See flowsheet/record as well for more information.  Social history: Negative.  See flowsheet/record as well for more information.  Allergies, and medications have been entered into the medical record, reviewed, and no changes needed.   Review of Systems: No fevers, chills, night sweats, weight loss, chest pain, or shortness of breath.   Objective:    General: Well Developed, well nourished, and in no acute distress.  Neuro: Alert and oriented x3, extra-ocular muscles intact, sensation grossly intact.  HEENT: Normocephalic, atraumatic, pupils equal round reactive to light, neck supple, no masses, no lymphadenopathy, thyroid nonpalpable. Oropharynx, nasopharynx, ear canals unremarkable with the exception of some ulcers on the soft palate and minimal erythema of the right tympanic membrane Skin: Warm and dry, no rashes. Cardiac: Regular rate and rhythm, no murmurs rubs or gallops, no lower extremity edema.  Respiratory: Clear to auscultation bilaterally. Not using accessory muscles, speaking in full sentences.  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 Chest x-ray, with hemoptysis we are going to add azithromycin, Tussionex to abort trauma to the airways from coughing. She will absolutely return in one week if no better, at that point we would consider a CT scan and referral to ENT for  laryngoscopy.  ___________________________________________ Ihor Austin. Benjamin Stain, M.D., ABFM., CAQSM. Primary Care and Sports Medicine  [facility]  Adjunct Instructor of Family Medicine  [employer]

## 2017-07-28 MED FILL — FLUoxetine HCL 40 MG CAPS: 40 | 90 days supply | Qty: 90 | Fill #1

## 2017-08-29 ENCOUNTER — Ambulatory Visit (HOSPITAL_COMMUNITY): Payer: Self-pay | Admitting: Psychiatry

## 2017-09-12 ENCOUNTER — Telehealth (HOSPITAL_COMMUNITY): Payer: Self-pay

## 2017-09-12 NOTE — Telephone Encounter (Signed)
Patient has a rescue dog that she states she has registered as a support animal and she needs a letter from you stating the reason that she would need the dog in order for her to be able to keep it. Please review and advise, thank you

## 2017-09-12 NOTE — Telephone Encounter (Signed)
I do not recall her and I talking about this, is she under the impression that we did?  She had an appointment with me that she cancelled, she can reschedule to come in sooner if she would like and we can discuss.

## 2017-09-12 NOTE — Telephone Encounter (Signed)
Called patient back to offer a sooner appointment. I got the voicemail and left a HIPAA compliant message to call me back at my desk number

## 2017-09-15 MED FILL — tiZANidine HCL 4 MG TABS: 4 | 40 days supply | Qty: 40 | Fill #2 | Status: TO

## 2017-09-16 ENCOUNTER — Ambulatory Visit: Payer: 59 | Admitting: Psychology

## 2017-09-16 DIAGNOSIS — F4010 Social phobia, unspecified: Secondary | ICD-10-CM | POA: Diagnosis not present

## 2017-09-16 DIAGNOSIS — F3340 Major depressive disorder, recurrent, in remission, unspecified: Secondary | ICD-10-CM | POA: Diagnosis not present

## 2017-09-16 DIAGNOSIS — F902 Attention-deficit hyperactivity disorder, combined type: Secondary | ICD-10-CM

## 2017-10-03 ENCOUNTER — Ambulatory Visit (INDEPENDENT_AMBULATORY_CARE_PROVIDER_SITE_OTHER): Payer: 59 | Admitting: Psychiatry

## 2017-10-03 ENCOUNTER — Encounter (HOSPITAL_COMMUNITY): Payer: Self-pay | Admitting: Psychiatry

## 2017-10-03 VITALS — BP 114/77 | HR 79 | Ht 67.5 in | Wt 317.2 lb

## 2017-10-03 DIAGNOSIS — Z818 Family history of other mental and behavioral disorders: Secondary | ICD-10-CM | POA: Diagnosis not present

## 2017-10-03 DIAGNOSIS — Z720 Tobacco use: Secondary | ICD-10-CM | POA: Diagnosis not present

## 2017-10-03 DIAGNOSIS — F331 Major depressive disorder, recurrent, moderate: Secondary | ICD-10-CM | POA: Diagnosis not present

## 2017-10-03 DIAGNOSIS — Z79899 Other long term (current) drug therapy: Secondary | ICD-10-CM | POA: Diagnosis not present

## 2017-10-03 DIAGNOSIS — F411 Generalized anxiety disorder: Secondary | ICD-10-CM

## 2017-10-03 MED ORDER — FLUOXETINE HCL 40 MG PO CAPS: 40.0000 mg | ORAL_CAPSULE | Freq: Every day | ORAL | 0 refills | Status: DC

## 2017-10-03 NOTE — Progress Notes (Signed)
BH MD/PA/NP OP Progress Note  10/03/2017 11:54 AM Emma Boyer  MRN:  [id number]  Chief Complaint: med check  HPI: Emma Boyer presents for med management and to discuss therapeutic support animal.  She recently adopted a 22-year-old Advertising account plannerGolden retriever, and reports that this has been a tremendous support for her.  She has noticed that her panic and anxiety attacks are much easier to manage, she has tended to be more active which has been helpful for her back pain.  She tends to sleep much better at night feeling secure with her dog they are with her.  She reports that her roommate who had a therapeutic emotional support animal in the past, is moving out, and she adopted the dog recognizing that this has been a support for her.  She reports that the Prozac has been helpful for her depression and anxiety and overall she feels like things are really stable.  No acute safety issues.  She requests a letter for her landlord, for support of her having the emotional support animal in her apartment.  She has already registered the animal with a national emotional support Biochemist, clinicalanimal website.   I provided the patient with a letter of support, and also educated her that the landlord would have the discretion to decide if there was any reasonable additional fees or monthly fees for her having the animal.  Discussed the difference between and emotional support animal and a registered service animal.  Patient expressed understanding.  Visit Diagnosis:    ICD-10-CM   1. Generalized anxiety disorder F41.1   2. Moderate episode of recurrent major depressive disorder (HCC) F33.1 FLUoxetine (PROZAC) 40 MG capsule    Past Psychiatric History: See intake H&P for full details. Reviewed, with no updates at this time.   Past Medical History:  Past Medical History:  Diagnosis Date  . Bulging lumbar disc 2014  . HA (headache)   . Low back pain   . Neck pain   . Pneumonia   . Pseudopapilledema   . Shoulder pain    . Vision abnormalities    wears contacts, right eye worse    Past Surgical History:  Procedure Laterality Date  . None    . SUBOCCIPITAL CRANIECTOMY CERVICAL LAMINECTOMY N/A 05/01/2013   Procedure: Chiari Decompression;  Surgeon: Maeola HarmanJoseph Stern, MD;  Location: [facility];  Service: Neurosurgery;  Laterality: N/A;  Chiari Decompression    Family Psychiatric History: See intake H&P for full details. Reviewed, with no updates at this time.   Family History:  Family History  Problem Relation Age of Onset  . High blood pressure Father   . Arthritis Father   . Anxiety disorder Mother   . Depression Mother   . Anxiety disorder Sister   . Depression Sister   . Physical abuse Sister   . Sexual abuse Sister   . ADD / ADHD Maternal Aunt   . Anxiety disorder Maternal Aunt   . Depression Maternal Aunt   . Anxiety disorder Maternal Grandfather   . Dementia Maternal Grandfather   . Depression Maternal Grandfather     Social History:  Social History   Socioeconomic History  . Marital status: Single    Spouse name: None  . Number of children: 0  . Years of education: None  . Highest education level: None  Social Needs  . Financial resource strain: None  . Food insecurity - worry: None  . Food insecurity - inability: None  . Transportation needs - medical:  None  . Transportation needs - non-medical: None  Occupational History    Comment: [employer]  Tobacco Use  . Smoking status: Former Smoker    Packs/day: 0.10    Years: 7.00    Pack years: 0.70    Types: Cigarettes    Last attempt to quit: 03/18/2017    Years since quitting: 0.5  . Smokeless tobacco: Never Used  . Tobacco comment: Smokes about 2 per day.  Substance and Sexual Activity  . Alcohol use: Yes    Alcohol/week: 8.4 oz    Types: 7 Glasses of wine, 7 Cans of beer per week    Comment: social   . Drug use: No  . Sexual activity: Yes    Partners: Male    Birth control/protection: IUD  Other Topics Concern  .  None  Social History Narrative   Patient lives at home with parents . 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    Right handed.   Caffeine- One cup of coffee daily and one soda.     Allergies:  Allergies  Allergen Reactions  . Peanut-Containing Drug Products Other (See Comments)    Childhood reaction    Metabolic Disorder Labs: Lab Results  Component Value Date   HGBA1C 5.5 02/13/2014   MPG 111 02/13/2014   No results found for: PROLACTIN Lab Results  Component Value Date   CHOL 158 02/13/2014   TRIG 99 02/13/2014   HDL 49 02/13/2014   CHOLHDL 3.2 02/13/2014   VLDL 20 02/13/2014   LDLCALC 89 02/13/2014   Lab Results  Component Value Date   TSH 1.154 02/13/2014    Therapeutic Level Labs: No results found for: LITHIUM No results found for: VALPROATE No components found for:  CBMZ  Current Medications: Current Outpatient Medications  Medication Sig Dispense Refill  . FLUoxetine (PROZAC) 40 MG capsule Take 1 capsule (40 mg total) by mouth daily. 90 capsule 0  . Omega-3 Fatty Acids (FISH OIL) 1000 MG CAPS Take by mouth.    Marland Kitchen tiZANidine (ZANAFLEX) 4 MG tablet TAKE 1 TABLET BY MOUTH NIGHTLY 40 tablet 2  . topiramate (TOPAMAX) 200 MG tablet TAKE 1 TABLET BY MOUTH DAILY 90 tablet 0   No current facility-administered medications for this visit.      Musculoskeletal: Strength & Muscle Tone: within normal limits Gait & Station: normal Patient leans: N/A  Psychiatric Specialty Exam: ROS  Blood pressure 114/77, pulse 79, height 5' 7.5" (1.715 m), weight (!) 317 lb 3.2 oz (143.9 kg).Body mass index is 48.95 kg/m.  General Appearance: Casual and Fairly Groomed  Eye Contact:  Good  Speech:  Clear and Coherent and Normal Rate  Volume:  Normal  Mood:  Euthymic  Affect:  Appropriate and Congruent  Thought Process:  Goal Directed and Descriptions of Associations: Intact  Orientation:  Full (Time, Place, and Person)  Thought Content: Logical    Suicidal Thoughts:  No  Homicidal Thoughts:  No  Memory:  Immediate;   Fair  Judgement:  Good  Insight:  Good  Psychomotor Activity:  Normal  Concentration:  Concentration: Good  Recall:  Good  Fund of Knowledge: Good  Language: Good  Akathisia:  Negative  Handed:  Right  AIMS (if indicated): not done  Assets:  Communication Skills Desire for Improvement Financial Resources/Insurance Housing Intimacy Leisure Time Physical Health Resilience Social Support Talents/Skills Transportation Vocational/Educational  ADL's:  Intact  Cognition: WNL  Sleep:  Good   Screenings: PHQ2-9  Office Visit from 04/25/2017 in [facility] [place]  PHQ-2 Total Score  0       Assessment and Plan:  Emma Boyer presents for med management follow-up and to discuss emotional support animal.  Her mood and anxiety symptoms appear to be generally stable with Prozac, and on top of it, the emotional support animal she has recently adopted and registered has seemed to add benefit in terms of reducing her panic attacks, and improving activity outside of the home, and improving sleep in the evening.  I provided her with my letter of support for her to show her landlord if needed, but also educated her on the difference between and emotional support animals and a service animal.  She has ADHD testing scheduled for next month and we will follow-up the following week to consider any stimulant intervention.  1. Generalized anxiety disorder   2. Moderate episode of recurrent major depressive disorder (HCC)     Status of current problems: gradually improving  Labs Ordered: No orders of the defined types were placed in this encounter.   Labs Reviewed: na  Collateral Obtained/Records Reviewed: na  Plan:  Letter of support for emotional support animal Continue Prozac 40 mg daily ADHD testing pending  I spent 20 minutes with the patient in direct face-to-face clinical  care.  Greater than 50% of this time was spent in counseling and coordination of care with the patient.    Burnard Leigh, MD 10/03/2017, 11:54 AM

## 2017-10-18 ENCOUNTER — Ambulatory Visit (INDEPENDENT_AMBULATORY_CARE_PROVIDER_SITE_OTHER): Payer: 59 | Admitting: Psychology

## 2017-10-18 DIAGNOSIS — F908 Attention-deficit hyperactivity disorder, other type: Secondary | ICD-10-CM

## 2017-10-18 DIAGNOSIS — F4010 Social phobia, unspecified: Secondary | ICD-10-CM | POA: Diagnosis not present

## 2017-10-18 DIAGNOSIS — F324 Major depressive disorder, single episode, in partial remission: Secondary | ICD-10-CM

## 2017-10-25 ENCOUNTER — Encounter (HOSPITAL_COMMUNITY): Payer: Self-pay | Admitting: Psychiatry

## 2017-10-25 ENCOUNTER — Ambulatory Visit (HOSPITAL_COMMUNITY): Payer: 59 | Admitting: Psychiatry

## 2017-10-25 VITALS — BP 128/74 | HR 73 | Ht 67.0 in | Wt 315.0 lb

## 2017-10-25 DIAGNOSIS — F411 Generalized anxiety disorder: Secondary | ICD-10-CM

## 2017-10-25 DIAGNOSIS — F1290 Cannabis use, unspecified, uncomplicated: Secondary | ICD-10-CM

## 2017-10-25 DIAGNOSIS — Z79899 Other long term (current) drug therapy: Secondary | ICD-10-CM

## 2017-10-25 DIAGNOSIS — F331 Major depressive disorder, recurrent, moderate: Secondary | ICD-10-CM | POA: Diagnosis not present

## 2017-10-25 DIAGNOSIS — Z87891 Personal history of nicotine dependence: Secondary | ICD-10-CM | POA: Diagnosis not present

## 2017-10-25 DIAGNOSIS — F902 Attention-deficit hyperactivity disorder, combined type: Secondary | ICD-10-CM

## 2017-10-25 DIAGNOSIS — Z818 Family history of other mental and behavioral disorders: Secondary | ICD-10-CM

## 2017-10-25 MED ORDER — METHYLPHENIDATE HCL 10 MG PO TABS: 10.0000 mg | ORAL_TABLET | Freq: Two times a day (BID) | ORAL | 0 refills | Status: DC

## 2017-10-25 MED ORDER — FLUOXETINE HCL 40 MG PO CAPS: 40.0000 mg | ORAL_CAPSULE | Freq: Every day | ORAL | 0 refills | Status: DC

## 2017-10-25 MED FILL — FLUoxetine HCL 40 MG CAPS: 40 | 90 days supply | Qty: 90 | Fill #0

## 2017-10-25 MED FILL — METHYLPHENIDATE 10 MG TAB: 10 | 30 days supply | Qty: 60 | Fill #0

## 2017-10-25 NOTE — Progress Notes (Signed)
BH MD/PA/NP OP Progress Note  10/25/2017 3:32 PM Anna GenreShelby L Monroy  MRN:  [id number]  Chief Complaint: ADHD medication HPI: The patient presents for medication management follow-up.  I reviewed the psychological assessment by Dr. Jason FilaBray.  Symptoms are consistent with unspecified ADHD.  Patient and I spent time discussing the risks and benefits of stimulants and agreed to start with immediate release methylphenidate.  Patient has no history of seizures, sudden cardiac death in the family, no history of cardiac arrhythmia.  Educated patient that this can increase the risk of anxiety and headaches.  I educated the patient that these medications do not combine well with marijuana and predisposed psychosis.  I recommended strongly that she minimize the use of her marijuana.  She reports that she has decreased her use to approximately once a week.  I encouraged her to take weekend holidays from methylphenidate.  We agreed to start 10 mg twice a day, and continue Prozac at the current dose.  She feels like her mood and anxiety have actually been fairly well managed over the past couple months and reports things are going well in school.  No acute safety issues and we will follow-up in 3 months.  Visit Diagnosis:    ICD-10-CM   1. Generalized anxiety disorder F41.1   2. Moderate episode of recurrent major depressive disorder (HCC) F33.1 FLUoxetine (PROZAC) 40 MG capsule  3. Attention deficit hyperactivity disorder (ADHD), combined type F90.2     Past Psychiatric History: See intake H&P for full details. Reviewed, with no updates at this time.   Past Medical History:  Past Medical History:  Diagnosis Date  . Bulging lumbar disc 2014  . HA (headache)   . Low back pain   . Neck pain   . Pneumonia   . Pseudopapilledema   . Shoulder pain   . Vision abnormalities    wears contacts, right eye worse    Past Surgical History:  Procedure Laterality Date  . None    . SUBOCCIPITAL CRANIECTOMY CERVICAL  LAMINECTOMY N/A 05/01/2013   Procedure: Chiari Decompression;  Surgeon: Maeola HarmanJoseph Stern, MD;  Location: [facility];  Service: Neurosurgery;  Laterality: N/A;  Chiari Decompression    Family Psychiatric History: See intake H&P for full details. Reviewed, with no updates at this time.  Family History:  Family History  Problem Relation Age of Onset  . High blood pressure Father   . Arthritis Father   . Anxiety disorder Mother   . Depression Mother   . Anxiety disorder Sister   . Depression Sister   . Physical abuse Sister   . Sexual abuse Sister   . ADD / ADHD Maternal Aunt   . Anxiety disorder Maternal Aunt   . Depression Maternal Aunt   . Anxiety disorder Maternal Grandfather   . Dementia Maternal Grandfather   . Depression Maternal Grandfather     Social History:  Social History   Socioeconomic History  . Marital status: Single    Spouse name: None  . Number of children: 0  . Years of education: None  . Highest education level: None  Social Needs  . Financial resource strain: None  . Food insecurity - worry: None  . Food insecurity - inability: None  . Transportation needs - medical: None  . Transportation needs - non-medical: None  Occupational History    Comment: [employer]  Tobacco Use  . Smoking status: Former Smoker    Packs/day: 0.10    Years: 7.00  Pack years: 0.70    Types: Cigarettes    Last attempt to quit: 03/18/2017    Years since quitting: 0.6  . Smokeless tobacco: Never Used  . Tobacco comment: Smokes about 2 per day.  Substance and Sexual Activity  . Alcohol use: Yes    Alcohol/week: 8.4 oz    Types: 7 Glasses of wine, 7 Cans of beer per week    Comment: social   . Drug use: No  . Sexual activity: Yes    Partners: Male    Birth control/protection: IUD  Other Topics Concern  . None  Social History Narrative   Patient lives at home with parents . 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    Right handed.   Caffeine-  One cup of coffee daily and one soda.     Allergies:  Allergies  Allergen Reactions  . Peanut-Containing Drug Products Other (See Comments)    Childhood reaction    Metabolic Disorder Labs: Lab Results  Component Value Date   HGBA1C 5.5 02/13/2014   MPG 111 02/13/2014   No results found for: PROLACTIN Lab Results  Component Value Date   CHOL 158 02/13/2014   TRIG 99 02/13/2014   HDL 49 02/13/2014   CHOLHDL 3.2 02/13/2014   VLDL 20 02/13/2014   LDLCALC 89 02/13/2014   Lab Results  Component Value Date   TSH 1.154 02/13/2014    Therapeutic Level Labs: No results found for: LITHIUM No results found for: VALPROATE No components found for:  CBMZ  Current Medications: Current Outpatient Medications  Medication Sig Dispense Refill  . FLUoxetine (PROZAC) 40 MG capsule Take 1 capsule (40 mg total) by mouth daily. 90 capsule 0  . Omega-3 Fatty Acids (FISH OIL) 1000 MG CAPS Take by mouth.    Marland Kitchen tiZANidine (ZANAFLEX) 4 MG tablet TAKE 1 TABLET BY MOUTH NIGHTLY 40 tablet 2  . methylphenidate (RITALIN) 10 MG tablet Take 1 tablet (10 mg total) by mouth 2 (two) times daily with breakfast and lunch. 60 tablet 0  . [START ON 11/24/2017] methylphenidate (RITALIN) 10 MG tablet Take 1 tablet (10 mg total) by mouth 2 (two) times daily with breakfast and lunch. 60 tablet 0   No current facility-administered medications for this visit.      Musculoskeletal: Strength & Muscle Tone: within normal limits Gait & Station: normal Patient leans: N/A  Psychiatric Specialty Exam: ROS  Blood pressure 128/74, pulse 73, height 5\' 7"  (1.702 m), weight (!) 315 lb (142.9 kg).Body mass index is 49.34 kg/m.  General Appearance: Casual and Well Groomed  Eye Contact:  Good  Speech:  Clear and Coherent  Volume:  Normal  Mood:  Euthymic  Affect:  Appropriate and Congruent  Thought Process:  Goal Directed and Descriptions of Associations: Intact  Orientation:  Full (Time, Place, and Person)  Thought  Content: Logical   Suicidal Thoughts:  No  Homicidal Thoughts:  No  Memory:  Immediate;   Good  Judgement:  Good  Insight:  Fair  Psychomotor Activity:  Normal  Concentration:  Concentration: Good  Recall:  Good  Fund of Knowledge: Good  Language: Good  Akathisia:  Negative  Handed:  Right  AIMS (if indicated): not done  Assets:  Communication Skills Desire for Improvement Financial Resources/Insurance Housing Transportation  ADL's:  Intact  Cognition: WNL  Sleep:  Good   Screenings: PHQ2-9     Office Visit from 04/25/2017 in [facility]  PHQ-2 Total Score  0       Assessment and Plan:  MILLIANI HERRADA presents for medication management follow-up.  Her mood and anxiety symptoms have been fairly under control with Prozac 40 mg daily and this is also been helpful for her migraines so she has been able to discontinue Topamax.  She continues to use marijuana sporadically throughout the week.  Unfortunately, experimentation with substances can often be seen in individuals with ADHD.  She recently completed ADHD psychological testing and had a diagnosis consistent with unspecified attention deficit disorder.  We agreed to initiate stimulants as below and I reviewed the risks and benefits with the patient, and reiterated to her that these are habit forming indications and should be used responsibly.  We will follow-up in 3 months or sooner if needed.  I have instructed her to message me on epic in about 2 months to let me know how things are going.  1. Generalized anxiety disorder   2. Moderate episode of recurrent major depressive disorder (HCC)   3. Attention deficit hyperactivity disorder (ADHD), combined type     Status of current problems: adhd new problem  Labs Ordered: No orders of the defined types were placed in this encounter.   Labs Reviewed: n/a  Collateral Obtained/Records Reviewed: Reviewed the neuropsych testing from Dr.  Jason Fila  Plan:  Appreciate psychology assistance in clarifying diagnostic presentation Initiate Ritalin 10 mg twice a day Prozac 40 mg daily Return to clinic in 3 months  I spent 15 minutes with the patient in direct face-to-face clinical care.  Greater than 50% of this time was spent in counseling and coordination of care with the patient.    Burnard Leigh, MD 10/25/2017, 3:32 PM

## 2017-11-07 DIAGNOSIS — J029 Acute pharyngitis, unspecified: Secondary | ICD-10-CM | POA: Diagnosis not present

## 2017-11-07 MED FILL — PENICILLIN VK 500 MG TABLET: 500 | 10 days supply | Qty: 20 | Fill #0

## 2017-11-11 ENCOUNTER — Other Ambulatory Visit (HOSPITAL_COMMUNITY): Payer: Self-pay | Admitting: Psychiatry

## 2017-11-11 ENCOUNTER — Encounter (HOSPITAL_COMMUNITY): Payer: Self-pay | Admitting: Psychiatry

## 2017-11-11 MED ORDER — METHYLPHENIDATE HCL 10 MG PO TABS: ORAL_TABLET | ORAL | 0 refills | Status: DC

## 2017-11-14 ENCOUNTER — Encounter: Payer: Self-pay | Admitting: Family Medicine

## 2017-11-14 ENCOUNTER — Ambulatory Visit (INDEPENDENT_AMBULATORY_CARE_PROVIDER_SITE_OTHER): Payer: 59 | Admitting: Family Medicine

## 2017-11-14 VITALS — BP 125/77 | HR 69 | Temp 97.6°F | Ht 67.0 in | Wt 314.0 lb

## 2017-11-14 DIAGNOSIS — J039 Acute tonsillitis, unspecified: Secondary | ICD-10-CM

## 2017-11-14 MED ORDER — PREDNISONE 10 MG PO TABS: 30.0000 mg | ORAL_TABLET | Freq: Every day | ORAL | 0 refills | Status: DC

## 2017-11-14 MED ORDER — CEFDINIR 300 MG PO CAPS: 300.0000 mg | ORAL_CAPSULE | Freq: Two times a day (BID) | ORAL | 0 refills | Status: DC

## 2017-11-14 MED FILL — CEFDINIR 300 MG CAPS: 300 | 7 days supply | Qty: 14 | Fill #0

## 2017-11-14 NOTE — Patient Instructions (Addendum)
Thank you for coming in today. Continue over the counter medicine like tylenol.  Use also allergy medicine like zyrtec (certizine) If not better take the prednisone.  Recheck if not better or if worse.  Call or go to the emergency room if you get worse, have trouble breathing, have chest pains, or palpitations.    Tonsillitis Tonsillitis is an infection of the throat that causes the tonsils to become red, tender, and swollen. Tonsils are collections of lymphoid tissue at the back of the throat. Each tonsil has crevices (crypts). Tonsils help fight nose and throat infections and keep infection from spreading to other parts of the body for the first 18 months of life. What are the causes? Sudden (acute) tonsillitis is usually caused by infection with streptococcal bacteria. Long-lasting (chronic) tonsillitis occurs when the crypts of the tonsils become filled with pieces of food and bacteria, which makes it easy for the tonsils to become repeatedly infected. What are the signs or symptoms? Symptoms of tonsillitis include:  A sore throat, with possible difficulty swallowing.  White patches on the tonsils.  Fever.  Tiredness.  New episodes of snoring during sleep, when you did not snore before.  Small, foul-smelling, yellowish-white pieces of material (tonsilloliths) that you occasionally cough up or spit out. The tonsilloliths can also cause you to have bad breath.  How is this diagnosed? Tonsillitis can be diagnosed through a physical exam. Diagnosis can be confirmed with the results of lab tests, including a throat culture. How is this treated? The goals of tonsillitis treatment include the reduction of the severity and duration of symptoms and prevention of associated conditions. Symptoms of tonsillitis can be improved with the use of steroids to reduce the swelling. Tonsillitis caused by bacteria can be treated with antibiotic medicines. Usually, treatment with antibiotic medicines is  started before the cause of the tonsillitis is known. However, if it is determined that the cause is not bacterial, antibiotic medicines will not treat the tonsillitis. If attacks of tonsillitis are severe and frequent, your health care provider may recommend surgery to remove the tonsils (tonsillectomy). Follow these instructions at home:  Rest as much as possible and get plenty of sleep.  Drink plenty of fluids. While the throat is very sore, eat soft foods or liquids, such as sherbet, soups, or instant breakfast drinks.  Eat frozen ice pops.  Gargle with a warm or cold liquid to help soothe the throat. Mix 1/4 teaspoon of salt and 1/4 teaspoon of baking soda in 8 oz of water. Contact a health care provider if:  Large, tender lumps develop in your neck.  A rash develops.  A green, yellow-brown, or bloody substance is coughed up.  You are unable to swallow liquids or food for 24 hours.  You notice that only one of the tonsils is swollen. Get help right away if:  You develop any new symptoms such as vomiting, severe headache, stiff neck, chest pain, or trouble breathing or swallowing.  You have severe throat pain along with drooling or voice changes.  You have severe pain, unrelieved with recommended medications.  You are unable to fully open the mouth.  You develop redness, swelling, or severe pain anywhere in the neck.  You have a fever. This information is not intended to replace advice given to you by your health care provider. Make sure you discuss any questions you have with your health care provider. Document Released: 05/12/2005 Document Revised: 01/08/2016 Document Reviewed: 01/19/2013 Elsevier Interactive Patient Education  2017  [employer].

## 2017-11-14 NOTE — Progress Notes (Signed)
Emma Boyer is a 22 y.o. female who presents to [facility] today for sore throat and congestions.   Symptoms present for 10 days. Emma Boyer went to an Urgent Care clinic 7 days ago and was diagnosed with strep throat.  She was treated with oral penicillin.  She notes continued sore throat fatigue and congestion.  She notes a mild cough.  She notes some sneezing and thinks there could be some allergy symptoms here as well.  She has tried some over-the-counter medications for pain control which have helped a little as well.  No vomiting diarrhea chest pain or palpitations.   Past Medical History:  Diagnosis Date  . Bulging lumbar disc 2014  . HA (headache)   . Low back pain   . Neck pain   . Pneumonia   . Pseudopapilledema   . Shoulder pain   . Vision abnormalities    wears contacts, right eye worse   Past Surgical History:  Procedure Laterality Date  . None    . SUBOCCIPITAL CRANIECTOMY CERVICAL LAMINECTOMY N/A 05/01/2013   Procedure: Chiari Decompression;  Surgeon: Maeola Harman, MD;  Location: [facility];  Service: Neurosurgery;  Laterality: N/A;  Chiari Decompression   Social History   Tobacco Use  . Smoking status: Former Smoker    Packs/day: 0.10    Years: 7.00    Pack years: 0.70    Types: Cigarettes    Last attempt to quit: 03/18/2017    Years since quitting: 0.6  . Smokeless tobacco: Never Used  . Tobacco comment: Smokes about 2 per day.  Substance Use Topics  . Alcohol use: Yes    Alcohol/week: 8.4 oz    Types: 7 Glasses of wine, 7 Cans of beer per week    Comment: social    family history includes ADD / ADHD in her maternal aunt; Anxiety disorder in her maternal aunt, maternal grandfather, mother, and sister; Arthritis in her father; Dementia in her maternal grandfather; Depression in her maternal aunt, maternal grandfather, mother, and  sister; High blood pressure in her father; Physical abuse in her sister; Sexual abuse in her sister.  ROS as above:  Medications: Current Outpatient Medications  Medication Sig Dispense Refill  . FLUoxetine (PROZAC) 40 MG capsule Take 1 capsule (40 mg total) by mouth daily. 90 capsule 0  . methylphenidate (RITALIN) 10 MG tablet 20 mg in AM and 10 mg at noon 90 tablet 0  . Omega-3 Fatty Acids (FISH OIL) 1000 MG CAPS Take by mouth.    Marland Kitchen tiZANidine (ZANAFLEX) 4 MG tablet TAKE 1 TABLET BY MOUTH NIGHTLY 40 tablet 2  . cefdinir (OMNICEF) 300 MG capsule Take 1 capsule (300 mg total) by mouth 2 (two) times daily. 14 capsule 0  . predniSONE (DELTASONE) 10 MG tablet Take 3 tablets (30 mg total) by mouth daily with breakfast. 15 tablet 0   No current facility-administered medications for this visit.    Allergies  Allergen Reactions  . Peanut-Containing Drug Products Other (See Comments)    Childhood reaction    Health Maintenance Health Maintenance  Topic Date Due  . HIV Screening  08/18/2010  . PAP SMEAR  08/18/2016  . TETANUS/TDAP  01/31/2017  . INFLUENZA VACCINE  04/16/2018 (Originally 03/16/2018)     Exam:  BP 125/77   Pulse 69   Temp 97.6 F (36.4 C) (Oral)   Ht 5\' 7"  (1.702 m)   Wt (!) 314 lb (  142.4 kg)   SpO2 97%   BMI 49.18 kg/m  Gen: Well NAD HEENT: EOMI,  MMM posterior pharynx shows tonsillar hypertrophy and erythema without exudate bilaterally.  No significant nasal discharge.  Nasal turbinates are mildly inflamed bilaterally.  Mild cervical lymphadenopathy present bilaterally.  Normal tympanic membranes bilaterally. Lungs: Normal work of breathing. CTABL Heart: RRR no MRG Abd: NABS, Soft. Nondistended, Nontender Exts: Brisk capillary refill, warm and well perfused.    No results found for this or any previous visit (from the past 72 hour(s)). No results found.    Assessment and Plan: 22 y.o. female with persistent pharyngitis following penicillin treatment for  strep throat.  Penicillin should be adequate therapy for strep throat however patient remains symptomatic.  It is possible that she is not been able to take the penicillin 4 times daily as prescribed or perhaps she is developed tonsillitis.  Additionally this could also be a second viral URI or even seasonal allergies are all 3 issues combined.  As she has tonsillar hypertrophy today I think is reasonable to treat with [facility] for possible treatment resistant tonsillitis.  Additionally recommend continued over-the-counter medications like Tylenol or ibuprofen.  Recommend using an oral antihistamine like Zyrtec as well.  Backup medication is prednisone if not better.  Additionally recommend Pap smear as part of routine health maintenance.  Patient is already scheduled an appointment with OB/GYN.  The patient is due for Tdap but is sick today.  We will postpone  HIV screening recommended at next blood draw. No orders of the defined types were placed in this encounter.  Meds ordered this encounter  Medications  . cefdinir (OMNICEF) 300 MG capsule    Sig: Take 1 capsule (300 mg total) by mouth 2 (two) times daily.    Dispense:  14 capsule    Refill:  0  . predniSONE (DELTASONE) 10 MG tablet    Sig: Take 3 tablets (30 mg total) by mouth daily with breakfast.    Dispense:  15 tablet    Refill:  0     Discussed warning signs or symptoms. Please see discharge instructions. Patient expresses understanding.

## 2017-11-15 ENCOUNTER — Ambulatory Visit: Payer: 59 | Admitting: Psychology

## 2017-12-06 ENCOUNTER — Ambulatory Visit (INDEPENDENT_AMBULATORY_CARE_PROVIDER_SITE_OTHER): Payer: 59 | Admitting: Psychology

## 2017-12-06 DIAGNOSIS — F908 Attention-deficit hyperactivity disorder, other type: Secondary | ICD-10-CM

## 2017-12-06 DIAGNOSIS — F324 Major depressive disorder, single episode, in partial remission: Secondary | ICD-10-CM

## 2017-12-06 DIAGNOSIS — F4010 Social phobia, unspecified: Secondary | ICD-10-CM | POA: Diagnosis not present

## 2017-12-06 MED FILL — METHYLPHENIDATE 10 MG TAB: 10 | 30 days supply | Qty: 90 | Fill #0

## 2017-12-16 ENCOUNTER — Ambulatory Visit: Payer: 59 | Admitting: Psychology

## 2017-12-27 ENCOUNTER — Ambulatory Visit: Payer: Self-pay | Admitting: Psychology

## 2018-01-25 ENCOUNTER — Ambulatory Visit (HOSPITAL_COMMUNITY): Payer: 59 | Admitting: Psychiatry

## 2018-02-14 ENCOUNTER — Other Ambulatory Visit (HOSPITAL_COMMUNITY): Payer: Self-pay | Admitting: Psychiatry

## 2018-02-14 ENCOUNTER — Other Ambulatory Visit: Payer: Self-pay | Admitting: Sports Medicine

## 2018-02-14 DIAGNOSIS — G43009 Migraine without aura, not intractable, without status migrainosus: Secondary | ICD-10-CM

## 2018-02-14 DIAGNOSIS — F331 Major depressive disorder, recurrent, moderate: Secondary | ICD-10-CM

## 2018-02-14 MED FILL — FLUoxetine HCL 40 MG CAPS: 40 | 90 days supply | Qty: 90 | Fill #0

## 2018-02-14 MED FILL — tiZANidine HCL 4 MG TABS: 4 | 40 days supply | Qty: 40 | Fill #0

## 2018-02-17 MED FILL — METHYLPHENIDATE 10 MG TAB: 10 | 30 days supply | Qty: 60 | Fill #0

## 2018-03-10 DIAGNOSIS — A599 Trichomoniasis, unspecified: Secondary | ICD-10-CM | POA: Diagnosis not present

## 2018-03-10 DIAGNOSIS — Z01419 Encounter for gynecological examination (general) (routine) without abnormal findings: Secondary | ICD-10-CM | POA: Diagnosis not present

## 2018-03-10 DIAGNOSIS — Z6841 Body Mass Index (BMI) 40.0 and over, adult: Secondary | ICD-10-CM | POA: Diagnosis not present

## 2018-03-10 DIAGNOSIS — Z113 Encounter for screening for infections with a predominantly sexual mode of transmission: Secondary | ICD-10-CM | POA: Diagnosis not present

## 2018-03-28 ENCOUNTER — Ambulatory Visit: Payer: Self-pay | Admitting: Sports Medicine

## 2018-03-30 ENCOUNTER — Other Ambulatory Visit (HOSPITAL_COMMUNITY): Payer: Self-pay | Admitting: Psychiatry

## 2018-03-30 MED FILL — METHYLPHENIDATE 10 MG TAB: 10 | 30 days supply | Qty: 90 | Fill #0

## 2018-05-02 MED FILL — tiZANidine HCL 4 MG TABS: 4 | 40 days supply | Qty: 40 | Fill #1

## 2018-05-10 MED FILL — FLUoxetine HCL 40 MG CAPS: 40 | 90 days supply | Qty: 90 | Fill #0

## 2018-05-23 ENCOUNTER — Ambulatory Visit (INDEPENDENT_AMBULATORY_CARE_PROVIDER_SITE_OTHER): Payer: 59 | Admitting: Psychiatry

## 2018-05-23 ENCOUNTER — Encounter (HOSPITAL_COMMUNITY): Payer: Self-pay | Admitting: Psychiatry

## 2018-05-23 VITALS — BP 141/90 | HR 68 | Ht 67.0 in | Wt 312.0 lb

## 2018-05-23 DIAGNOSIS — F902 Attention-deficit hyperactivity disorder, combined type: Secondary | ICD-10-CM | POA: Diagnosis not present

## 2018-05-23 DIAGNOSIS — F331 Major depressive disorder, recurrent, moderate: Secondary | ICD-10-CM

## 2018-05-23 DIAGNOSIS — Z79899 Other long term (current) drug therapy: Secondary | ICD-10-CM | POA: Diagnosis not present

## 2018-05-23 MED ORDER — METHYLPHENIDATE HCL 10 MG PO TABS: ORAL_TABLET | ORAL | 0 refills | Status: DC

## 2018-05-23 MED ORDER — FLUOXETINE HCL 40 MG PO CAPS: 40.0000 mg | ORAL_CAPSULE | Freq: Every day | ORAL | 0 refills | Status: DC

## 2018-05-23 MED FILL — METHYLPHENIDATE 10 MG TAB: 10 | 30 days supply | Qty: 90 | Fill #0

## 2018-05-23 NOTE — Progress Notes (Signed)
BH MD/PA/NP OP Progress Note  05/23/2018 2:54 PM Emma Boyer  MRN:  [id number]  Chief Complaint: I am doing fine.  I like to continue my medication.  HPI: Emma Boyer came for her follow-up appointment.  She is a patient of Dr. Rene Kocher who left the practice.  She feels current Prozac and methylphenidate is helping her anxiety, depression and ADD symptoms.  However sometimes she feel tired and lack of energy.  She lives with a roommate.  She is working at [employer] and she likes her job.  She denies any paranoia, hallucination, suicidal thoughts or homicidal thought.  She endorsed occasional drinking and smoking marijuana but denies any intoxication, blackouts or any withdrawal symptoms.  She denies any headaches, palpitation or any tremors.  She is not taking Prozac 40 mg which was increased in July.  Visit Diagnosis:    ICD-10-CM   1. Attention deficit hyperactivity disorder (ADHD), combined type F90.2 methylphenidate (RITALIN) 10 MG tablet  2. Moderate episode of recurrent major depressive disorder (HCC) F33.1 FLUoxetine (PROZAC) 40 MG capsule  3. Encounter for long-term (current) use of medications Z79.899 Hemoglobin A1c    CBC with Differential    Comprehensive metabolic panel    TSH    TSH    Comprehensive metabolic panel    CBC with Differential    Hemoglobin A1c    Past Psychiatric History: Reviewed. She denies any history of psychiatric inpatient treatment or any suicidal attempt.  In the past she had tried Wellbutrin for weight loss.  She was diagnosed ADHD after she had a psychological testing done by Dr. Jason Fila.  Patient denies any history of mania, psychosis or any hallucination.  Past Medical History:  Past Medical History:  Diagnosis Date  . Bulging lumbar disc 2014  . HA (headache)   . Low back pain   . Neck pain   . Pneumonia   . Pseudopapilledema   . Shoulder pain   . Vision abnormalities    wears contacts, right eye worse    Past Surgical History:   Procedure Laterality Date  . None    . SUBOCCIPITAL CRANIECTOMY CERVICAL LAMINECTOMY N/A 05/01/2013   Procedure: Chiari Decompression;  Surgeon: Maeola Harman, MD;  Location: [facility];  Service: Neurosurgery;  Laterality: N/A;  Chiari Decompression    Family Psychiatric History: Reviewed  Family History:  Family History  Problem Relation Age of Onset  . High blood pressure Father   . Arthritis Father   . Anxiety disorder Mother   . Depression Mother   . Anxiety disorder Sister   . Depression Sister   . Physical abuse Sister   . Sexual abuse Sister   . ADD / ADHD Maternal Aunt   . Anxiety disorder Maternal Aunt   . Depression Maternal Aunt   . Anxiety disorder Maternal Grandfather   . Dementia Maternal Grandfather   . Depression Maternal Grandfather     Social History:  Social History   Socioeconomic History  . Marital status: Single    Spouse name: Not on file  . Number of children: 0  . Years of education: Not on file  . Highest education level: Not on file  Occupational History    Comment: [employer]  Social Needs  . Financial resource strain: Not on file  . Food insecurity:    Worry: Not on file    Inability: Not on file  . Transportation needs:    Medical: Not on file    Non-medical: Not  on file  Tobacco Use  . Smoking status: Former Smoker    Packs/day: 0.10    Years: 7.00    Pack years: 0.70    Types: Cigarettes    Last attempt to quit: 03/18/2017    Years since quitting: 1.1  . Smokeless tobacco: Never Used  . Tobacco comment: Smokes about 2 per day.  Substance and Sexual Activity  . Alcohol use: Yes    Alcohol/week: 14.0 standard drinks    Types: 7 Glasses of wine, 7 Cans of beer per week    Comment: social   . Drug use: No  . Sexual activity: Yes    Partners: Male    Birth control/protection: IUD  Lifestyle  . Physical activity:    Days per week: Not on file    Minutes per session: Not on file  . Stress: Not on file  Relationships  .  Social connections:    Talks on phone: Not on file    Gets together: Not on file    Attends religious service: Not on file    Active member of club or organization: Not on file    Attends meetings of clubs or organizations: Not on file    Relationship status: Not on file  Other Topics Concern  . Not on file  Social History Narrative   Patient lives at home with parents . 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    Right handed.   Caffeine- One cup of coffee daily and one soda.     Allergies:  Allergies  Allergen Reactions  . Peanut-Containing Drug Products Other (See Comments)    Childhood reaction    Metabolic Disorder Labs: Lab Results  Component Value Date   HGBA1C 5.5 02/13/2014   MPG 111 02/13/2014   No results found for: PROLACTIN Lab Results  Component Value Date   CHOL 158 02/13/2014   TRIG 99 02/13/2014   HDL 49 02/13/2014   CHOLHDL 3.2 02/13/2014   VLDL 20 02/13/2014   LDLCALC 89 02/13/2014   Lab Results  Component Value Date   TSH 1.154 02/13/2014    Therapeutic Level Labs: No results found for: LITHIUM No results found for: VALPROATE No components found for:  CBMZ  Current Medications: Current Outpatient Medications  Medication Sig Dispense Refill  . FLUoxetine (PROZAC) 40 MG capsule TAKE 1 CAPSULE BY MOUTH ONCE DAILY 90 capsule 0  . Omega-3 Fatty Acids (FISH OIL) 1000 MG CAPS Take by mouth.    Marland Kitchen tiZANidine (ZANAFLEX) 4 MG tablet TAKE 1 TABLET BY MOUTH NIGHTLY 40 tablet 2  . methylphenidate (RITALIN) 10 MG tablet 20 mg in AM and 10 mg at noon (Patient not taking: Reported on 05/23/2018) 90 tablet 0   No current facility-administered medications for this visit.      Musculoskeletal: Strength & Muscle Tone: within normal limits Gait & Station: normal Patient leans: N/A  Psychiatric Specialty Exam: Review of Systems  Constitutional: Negative.   HENT: Negative.   Respiratory: Negative.   Gastrointestinal: Negative.    Genitourinary: Negative.   Musculoskeletal: Negative.   Skin: Negative.   Neurological: Negative.     Blood pressure (!) 141/90, pulse 68, height 5\' 7"  (1.702 m), weight (!) 312 lb (141.5 kg).Body mass index is 48.87 kg/m.  General Appearance: Casual  Eye Contact:  Good  Speech:  Clear and Coherent  Volume:  Normal  Mood:  Anxious  Affect:  Congruent  Thought Process:  Goal Directed  Orientation:  Full (Time, Place, and Person)  Thought Content: Logical   Suicidal Thoughts:  No  Homicidal Thoughts:  No  Memory:  Immediate;   Good Recent;   Good Remote;   Good  Judgement:  Good  Insight:  Good  Psychomotor Activity:  Normal  Concentration:  Concentration: Good and Attention Span: Good  Recall:  Good  Fund of Knowledge: Good  Language: Good  Akathisia:  No  Handed:  Right  AIMS (if indicated): not done  Assets:  Communication Skills Desire for Improvement Housing  ADL's:  Intact  Cognition: WNL  Sleep:  Good   Screenings: PHQ2-9     Office Visit from 04/25/2017 in [facility] [place]  PHQ-2 Total Score  0       Assessment and Plan: Generalized anxiety disorder.  ADHD, combined type.  Major depressive disorder, recurrent moderate.  I had a long discussion with the patient about occasional use of marijuana and drinking.  We discussed the stimulant use with the substance may be counterproductive.  Patient promised that she will stop using the substances.  She feel her current medicine is working.  She has no tremors shakes or any EPS.  She has no palpitation and insomnia.  She did not have a blood work in a while.  I will order CBC, CMP, TSH and hemoglobin A1c.  Patient is not interested in counseling.  Recommended to call us back if she has any question or any concern.  Discussed safety concerns at any time having active suicidal thoughts or homicidal thought that she need to call 911 or go to local emergency room.  Time spent 30 minutes.   More than 50% of the time spent in psychoeducation, counseling, coordination of care and reviewing collateral information and records from other providers.   Cleotis Nipper, MD 05/23/2018, 2:54 PM

## 2018-05-24 LAB — COMPREHENSIVE METABOLIC PANEL
ALBUMIN: 4.3 g/dL (ref 3.5–5.5)
ALT: 14 IU/L (ref 0–32)
AST: 14 IU/L (ref 0–40)
Albumin/Globulin Ratio: 2 (ref 1.2–2.2)
Alkaline Phosphatase: 85 IU/L (ref 39–117)
BUN / CREAT RATIO: 13 (ref 9–23)
BUN: 9 mg/dL (ref 6–20)
Bilirubin Total: <0.2 mg/dL (ref 0.0–1.2)
CALCIUM: 9.3 mg/dL (ref 8.7–10.2)
CO2: 20 mmol/L (ref 20–29)
Chloride: 102 mmol/L (ref 96–106)
Creatinine, Ser: 0.71 mg/dL (ref 0.57–1.00)
GFR, EST AFRICAN AMERICAN: 140 mL/min/{1.73_m2} (ref >59)
GFR, EST NON AFRICAN AMERICAN: 121 mL/min/{1.73_m2} (ref >59)
GLOBULIN, TOTAL: 2.1 g/dL (ref 1.5–4.5)
Glucose: 75 mg/dL (ref 65–99)
POTASSIUM: 4.4 mmol/L (ref 3.5–5.2)
SODIUM: 141 mmol/L (ref 134–144)
TOTAL PROTEIN: 6.4 g/dL (ref 6.0–8.5)

## 2018-05-24 LAB — CBC WITH DIFFERENTIAL/PLATELET
BASOS: 0 %
Basophils Absolute: 0 10*3/uL (ref 0.0–0.2)
EOS (ABSOLUTE): 0.1 10*3/uL (ref 0.0–0.4)
Eos: 1 %
Hematocrit: 40.6 % (ref 34.0–46.6)
Hemoglobin: 13.2 g/dL (ref 11.1–15.9)
Immature Grans (Abs): 0 10*3/uL (ref 0.0–0.1)
Immature Granulocytes: 0 %
LYMPHS: 26 %
Lymphocytes Absolute: 3.3 10*3/uL — ABNORMAL HIGH (ref 0.7–3.1)
MCH: 27.3 pg (ref 26.6–33.0)
MCHC: 32.5 g/dL (ref 31.5–35.7)
MCV: 84 fL (ref 79–97)
Monocytes Absolute: 0.6 10*3/uL (ref 0.1–0.9)
Monocytes: 5 %
NEUTROS ABS: 8.7 10*3/uL — AB (ref 1.4–7.0)
Neutrophils: 68 %
PLATELETS: 258 10*3/uL (ref 150–450)
RBC: 4.84 x10E6/uL (ref 3.77–5.28)
RDW: 13.9 % (ref 12.3–15.4)
WBC: 12.8 10*3/uL — ABNORMAL HIGH (ref 3.4–10.8)

## 2018-05-24 LAB — HEMOGLOBIN A1C
Est. average glucose Bld gHb Est-mCnc: 105 mg/dL
HEMOGLOBIN A1C: 5.3 % (ref 4.8–5.6)

## 2018-05-24 LAB — TSH: TSH: 2.45 u[IU]/mL (ref 0.450–4.500)

## 2018-07-03 ENCOUNTER — Other Ambulatory Visit (HOSPITAL_COMMUNITY): Payer: Self-pay | Admitting: Psychiatry

## 2018-07-03 DIAGNOSIS — F902 Attention-deficit hyperactivity disorder, combined type: Secondary | ICD-10-CM

## 2018-07-05 MED FILL — METHYLPHENIDATE 10 MG TAB: 10 | 30 days supply | Qty: 90 | Fill #0

## 2018-07-18 MED FILL — tiZANidine HCL 4 MG TABS: 4 | 40 days supply | Qty: 40 | Fill #2

## 2018-08-03 DIAGNOSIS — F900 Attention-deficit hyperactivity disorder, predominantly inattentive type: Secondary | ICD-10-CM | POA: Diagnosis not present

## 2018-08-03 DIAGNOSIS — F3342 Major depressive disorder, recurrent, in full remission: Secondary | ICD-10-CM | POA: Diagnosis not present

## 2018-08-28 MED FILL — FLUoxetine HCL 40 MG CAPS: 40 | 90 days supply | Qty: 90 | Fill #0

## 2018-08-28 MED FILL — METHYLPHENIDATE 10 MG TAB: 10 | 30 days supply | Qty: 90 | Fill #0

## 2018-10-06 ENCOUNTER — Ambulatory Visit (INDEPENDENT_AMBULATORY_CARE_PROVIDER_SITE_OTHER): Payer: 59 | Admitting: Sports Medicine

## 2018-10-06 ENCOUNTER — Encounter: Payer: Self-pay | Admitting: Sports Medicine

## 2018-10-06 DIAGNOSIS — B349 Viral infection, unspecified: Secondary | ICD-10-CM

## 2018-10-06 LAB — POCT RAPID STREP A (OFFICE): Rapid Strep A Screen: NEGATIVE

## 2018-10-06 NOTE — Patient Instructions (Signed)
Viral Illness, Adult °Viruses are tiny germs that can get into a person's body and cause illness. There are many different types of viruses, and they cause many types of illness. Viral illnesses can range from mild to severe. They can affect various parts of the body. °Common illnesses that are caused by a virus include colds and the flu. Viral illnesses also include serious conditions such as HIV/AIDS (human immunodeficiency virus/acquired immunodeficiency syndrome). A few viruses have been linked to certain cancers. °What are the causes? °Many types of viruses can cause illness. Viruses invade cells in your body, multiply, and cause the infected cells to malfunction or die. When the cell dies, it releases more of the virus. When this happens, you develop symptoms of the illness, and the virus continues to spread to other cells. If the virus takes over the function of the cell, it can cause the cell to divide and grow out of control, as is the case when a virus causes cancer. °Different viruses get into the body in different ways. You can get a virus by: °· Swallowing food or water that is contaminated with the virus. °· Breathing in droplets that have been coughed or sneezed into the air by an infected person. °· Touching a surface that has been contaminated with the virus and then touching your eyes, nose, or mouth. °· Being bitten by an insect or animal that carries the virus. °· Having sexual contact with a person who is infected with the virus. °· Being exposed to blood or fluids that contain the virus, either through an open cut or during a transfusion. °If a virus enters your body, your body's defense system (immune system) will try to fight the virus. You may be at higher risk for a viral illness if your immune system is weak. °What are the signs or symptoms? °Symptoms vary depending on the type of virus and the location of the cells that it invades. Common symptoms of the main types of viral illnesses  include: °Cold and flu viruses °· Fever. °· Headache. °· Sore throat. °· Muscle aches. °· Nasal congestion. °· Cough. °Digestive system (gastrointestinal) viruses °· Fever. °· Abdominal pain. °· Nausea. °· Diarrhea. °Liver viruses (hepatitis) °· Loss of appetite. °· Tiredness. °· Yellowing of the skin (jaundice). °Brain and spinal cord viruses °· Fever. °· Headache. °· Stiff neck. °· Nausea and vomiting. °· Confusion or sleepiness. °Skin viruses °· Warts. °· Itching. °· Rash. °Sexually transmitted viruses °· Discharge. °· Swelling. °· Redness. °· Rash. °How is this treated? °Viruses can be difficult to treat because they live within cells. Antibiotic medicines do not treat viruses because these drugs do not get inside cells. Treatment for a viral illness may include: °· Resting and drinking plenty of fluids. °· Medicines to relieve symptoms. These can include over-the-counter medicine for pain and fever, medicines for cough or congestion, and medicines to relieve diarrhea. °· Antiviral medicines. These drugs are available only for certain types of viruses. They may help reduce flu symptoms if taken early. There are also many antiviral medicines for hepatitis and HIV/AIDS. °Some viral illnesses can be prevented with vaccinations. A common example is the flu shot. °Follow these instructions at home: °Medicines ° °· Take over-the-counter and prescription medicines only as told by your health care provider. °· If you were prescribed an antiviral medicine, take it as told by your health care provider. Do not stop taking the medicine even if you start to feel better. °· Be aware of when   antibiotics are needed and when they are not needed. Antibiotics do not treat viruses. If your health care provider thinks that you may have a bacterial infection as well as a viral infection, you may get an antibiotic. °? Do not ask for an antibiotic prescription if you have been diagnosed with a viral illness. That will not make your  illness go away faster. °? Frequently taking antibiotics when they are not needed can lead to antibiotic resistance. When this develops, the medicine no longer works against the bacteria that it normally fights. °General instructions °· Drink enough fluids to keep your urine clear or pale yellow. °· Rest as much as possible. °· Return to your normal activities as told by your health care provider. Ask your health care provider what activities are safe for you. °· Keep all follow-up visits as told by your health care provider. This is important. °How is this prevented? °Take these actions to reduce your risk of viral infection: °· Eat a healthy diet and get enough rest. °· Wash your hands often with soap and water. This is especially important when you are in public places. If soap and water are not available, use hand sanitizer. °· Avoid close contact with friends and family who have a viral illness. °· If you travel to areas where viral gastrointestinal infection is common, avoid drinking water or eating raw food. °· Keep your immunizations up to date. Get a flu shot every year as told by your health care provider. °· Do not share toothbrushes, nail clippers, razors, or needles with other people. °· Always practice safe sex. ° °Contact a health care provider if: °· You have symptoms of a viral illness that do not go away. °· Your symptoms come back after going away. °· Your symptoms get worse. °Get help right away if: °· You have trouble breathing. °· You have a severe headache or a stiff neck. °· You have severe vomiting or abdominal pain. °This information is not intended to replace advice given to you by your health care provider. Make sure you discuss any questions you have with your health care provider. °Document Released: 12/12/2015 Document Revised: 01/14/2016 Document Reviewed: 12/12/2015 °Elsevier Interactive Patient Education © 2019 Elsevier Inc. ° °

## 2018-10-06 NOTE — Progress Notes (Signed)
Subjective:    CC: Feeling sick  HPI: For couple of days this pleasant 23 year old female has had a mild sore throat, mild cough, nonproductive.  No fevers or chills, mild diarrhea without vomiting.  No skin rash, no shortness of breath, no chest pain.  Symptoms are mild, improving.  I reviewed the past medical history, family history, social history, surgical history, and allergies today and no changes were needed.  Please see the problem list section below in epic for further details.  Past Medical History: Past Medical History:  Diagnosis Date  . Bulging lumbar disc 2014  . HA (headache)   . Low back pain   . Neck pain   . Pneumonia   . Pseudopapilledema   . Shoulder pain   . Vision abnormalities    wears contacts, right eye worse   Past Surgical History: Past Surgical History:  Procedure Laterality Date  . None    . SUBOCCIPITAL CRANIECTOMY CERVICAL LAMINECTOMY N/A 05/01/2013   Procedure: Chiari Decompression;  Surgeon: Maeola Harman, MD;  Location: [facility];  Service: Neurosurgery;  Laterality: N/A;  Chiari Decompression   Social History: Social History   Socioeconomic History  . Marital status: Single    Spouse name: Not on file  . Number of children: 0  . Years of education: Not on file  . Highest education level: Not on file  Occupational History    Comment: [employer]  Social Needs  . Financial resource strain: Not on file  . Food insecurity:    Worry: Not on file    Inability: Not on file  . Transportation needs:    Medical: Not on file    Non-medical: Not on file  Tobacco Use  . Smoking status: Former Smoker    Packs/day: 0.10    Years: 7.00    Pack years: 0.70    Types: Cigarettes    Last attempt to quit: 03/18/2017    Years since quitting: 1.5  . Smokeless tobacco: Never Used  . Tobacco comment: Smokes about 2 per day.  Substance and Sexual Activity  . Alcohol use: Yes    Alcohol/week: 14.0 standard drinks    Types: 7 Glasses of wine, 7 Cans  of beer per week    Comment: social   . Drug use: No  . Sexual activity: Yes    Partners: Male    Birth control/protection: I.U.D.  Lifestyle  . Physical activity:    Days per week: Not on file    Minutes per session: Not on file  . Stress: Not on file  Relationships  . Social connections:    Talks on phone: Not on file    Gets together: Not on file    Attends religious service: Not on file    Active member of club or organization: Not on file    Attends meetings of clubs or organizations: Not on file    Relationship status: Not on file  Other Topics Concern  . Not on file  Social History Narrative   Patient lives at home with parents . 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    Right handed.   Caffeine- One cup of coffee daily and one soda.    Family History: Family History  Problem Relation Age of Onset  . High blood pressure Father   . Arthritis Father   . Anxiety disorder Mother   . Depression Mother   . Anxiety disorder Sister   . Depression  Sister   . Physical abuse Sister   . Sexual abuse Sister   . ADD / ADHD Maternal Aunt   . Anxiety disorder Maternal Aunt   . Depression Maternal Aunt   . Anxiety disorder Maternal Grandfather   . Dementia Maternal Grandfather   . Depression Maternal Grandfather    Allergies: Allergies  Allergen Reactions  . Peanut-Containing Drug Products Other (See Comments)    Childhood reaction   Medications: See med rec.  Review of Systems: No fevers, chills, night sweats, weight loss, chest pain, or shortness of breath.   Objective:    General: Well Developed, well nourished, and in no acute distress.  Neuro: Alert and oriented x3, extra-ocular muscles intact, sensation grossly intact.  HEENT: Normocephalic, atraumatic, pupils equal round reactive to light, neck supple, no masses, no lymphadenopathy, thyroid nonpalpable.  Oropharynx, nasopharynx, ear canals unremarkable. Skin: Warm and dry, no  rashes. Cardiac: Regular rate and rhythm, no murmurs rubs or gallops, no lower extremity edema.  Respiratory: Clear to auscultation bilaterally. Not using accessory muscles, speaking in full sentences.  Impression and Recommendations:    Viral syndrome Sore throat, GI symptoms, likely a viral syndrome. Symptomatic treatment, stay hydrated, return as needed. ___________________________________________ Ihor Austin. Benjamin Stain, M.D., ABFM., CAQSM. Primary Care and Sports Medicine [facility]  Adjunct Professor of Family Medicine  [employer]

## 2018-10-06 NOTE — Assessment & Plan Note (Signed)
Sore throat, GI symptoms, likely a viral syndrome. Symptomatic treatment, stay hydrated, return as needed.

## 2018-10-26 MED FILL — METHYLPHENIDATE 10 MG TAB: 10 | 30 days supply | Qty: 90 | Fill #0

## 2018-11-09 DIAGNOSIS — F3342 Major depressive disorder, recurrent, in full remission: Secondary | ICD-10-CM | POA: Diagnosis not present

## 2018-11-09 DIAGNOSIS — F900 Attention-deficit hyperactivity disorder, predominantly inattentive type: Secondary | ICD-10-CM | POA: Diagnosis not present

## 2018-11-09 MED FILL — buPROPion HCL ER (XL) 150 M: 150 | 90 days supply | Qty: 90 | Fill #0

## 2018-11-09 MED FILL — tiZANidine HCL 4 MG TABS: 4 | 40 days supply | Qty: 40 | Fill #2

## 2019-01-09 ENCOUNTER — Other Ambulatory Visit: Payer: Self-pay | Admitting: Sports Medicine

## 2019-01-09 DIAGNOSIS — G43009 Migraine without aura, not intractable, without status migrainosus: Secondary | ICD-10-CM

## 2019-01-24 DIAGNOSIS — H52223 Regular astigmatism, bilateral: Secondary | ICD-10-CM | POA: Diagnosis not present

## 2019-01-24 DIAGNOSIS — Z135 Encounter for screening for eye and ear disorders: Secondary | ICD-10-CM | POA: Diagnosis not present

## 2019-01-24 DIAGNOSIS — H5213 Myopia, bilateral: Secondary | ICD-10-CM | POA: Diagnosis not present

## 2019-02-05 MED FILL — tiZANidine HCL 4 MG TABS: 4 | 40 days supply | Qty: 40 | Fill #0

## 2019-02-06 MED FILL — METHYLPHENIDATE 10 MG TAB: 10 | 90 days supply | Qty: 270 | Fill #0

## 2019-02-07 ENCOUNTER — Encounter: Payer: Self-pay | Admitting: Sports Medicine

## 2019-02-07 ENCOUNTER — Other Ambulatory Visit: Payer: Self-pay

## 2019-02-07 ENCOUNTER — Ambulatory Visit (INDEPENDENT_AMBULATORY_CARE_PROVIDER_SITE_OTHER): Payer: 59 | Admitting: Sports Medicine

## 2019-02-07 ENCOUNTER — Ambulatory Visit (INDEPENDENT_AMBULATORY_CARE_PROVIDER_SITE_OTHER): Payer: 59

## 2019-02-07 VITALS — BP 150/93 | HR 102 | Ht 67.0 in | Wt 326.0 lb

## 2019-02-07 DIAGNOSIS — Z8739 Personal history of other diseases of the musculoskeletal system and connective tissue: Secondary | ICD-10-CM

## 2019-02-07 DIAGNOSIS — M545 Low back pain: Secondary | ICD-10-CM | POA: Diagnosis not present

## 2019-02-07 DIAGNOSIS — Z23 Encounter for immunization: Secondary | ICD-10-CM

## 2019-02-07 DIAGNOSIS — M542 Cervicalgia: Secondary | ICD-10-CM | POA: Diagnosis not present

## 2019-02-07 DIAGNOSIS — Z299 Encounter for prophylactic measures, unspecified: Secondary | ICD-10-CM | POA: Diagnosis not present

## 2019-02-07 MED ORDER — PREDNISONE 50 MG PO TABS: ORAL_TABLET | ORAL | 0 refills | Status: DC

## 2019-02-07 MED ORDER — CYCLOBENZAPRINE HCL 10 MG PO TABS: ORAL_TABLET | ORAL | 0 refills | Status: DC

## 2019-02-07 MED ORDER — MELOXICAM 15 MG PO TABS: ORAL_TABLET | ORAL | 3 refills | Status: DC

## 2019-02-07 MED FILL — predniSONE 50 MG TABS: 50 | 5 days supply | Qty: 5 | Fill #0

## 2019-02-07 MED FILL — MELOXICAM 15 MG TABLET: 15 | 30 days supply | Qty: 30 | Fill #0

## 2019-02-07 MED FILL — CYCLOBENZAPRINE HCL 10 MG T: 10 | 10 days supply | Qty: 30 | Fill #0

## 2019-02-07 NOTE — Progress Notes (Signed)
Subjective:    CC: Acute low back pain  HPI: Occurred when trying to pick up her dog, pain is severe, persistent, localized in the low back with radiation into the back of the thighs but not past the knees, no bowel or bladder dysfunction or saddle numbness, constitutional symptoms, she does have some pain up in her shoulder blades as well.  Preventive measures: See below.  I reviewed the past medical history, family history, social history, surgical history, and allergies today and no changes were needed.  Please see the problem list section below in epic for further details.  Past Medical History: Past Medical History:  Diagnosis Date  . Bulging lumbar disc 2014  . HA (headache)   . Low back pain   . Neck pain   . Pneumonia   . Pseudopapilledema   . Shoulder pain   . Vision abnormalities    wears contacts, right eye worse   Past Surgical History: Past Surgical History:  Procedure Laterality Date  . None    . SUBOCCIPITAL CRANIECTOMY CERVICAL LAMINECTOMY N/A 05/01/2013   Procedure: Chiari Decompression;  Surgeon: Maeola HarmanJoseph Stern, MD;  Location: [facility];  Service: Neurosurgery;  Laterality: N/A;  Chiari Decompression   Social History: Social History   Socioeconomic History  . Marital status: Single    Spouse name: Not on file  . Number of children: 0  . Years of education: Not on file  . Highest education level: Not on file  Occupational History    Comment: [employer]  Social Needs  . Financial resource strain: Not on file  . Food insecurity    Worry: Not on file    Inability: Not on file  . Transportation needs    Medical: Not on file    Non-medical: Not on file  Tobacco Use  . Smoking status: Former Smoker    Packs/day: 0.10    Years: 7.00    Pack years: 0.70    Types: Cigarettes    Quit date: 03/18/2017    Years since quitting: 1.8  . Smokeless tobacco: Never Used  . Tobacco comment: Smokes about 2 per day.  Substance and Sexual Activity  . Alcohol  use: Yes    Alcohol/week: 14.0 standard drinks    Types: 7 Glasses of wine, 7 Cans of beer per week    Comment: social   . Drug use: No  . Sexual activity: Yes    Partners: Male    Birth control/protection: I.U.D.  Lifestyle  . Physical activity    Days per week: Not on file    Minutes per session: Not on file  . Stress: Not on file  Relationships  . Social Musicianconnections    Talks on phone: Not on file    Gets together: Not on file    Attends religious service: Not on file    Active member of club or organization: Not on file    Attends meetings of clubs or organizations: Not on file    Relationship status: Not on file  Other Topics Concern  . Not on file  Social History Narrative   Patient lives at home with parents . Patient is a high [employer] student. Patient works at [employer].    Right handed.   Caffeine- One cup of coffee daily and one soda.    Family History: Family History  Problem Relation Age of Onset  . High blood pressure Father   . Arthritis Father   . Anxiety disorder Mother   .  Depression Mother   . Anxiety disorder Sister   . Depression Sister   . Physical abuse Sister   . Sexual abuse Sister   . ADD / ADHD Maternal Aunt   . Anxiety disorder Maternal Aunt   . Depression Maternal Aunt   . Anxiety disorder Maternal Grandfather   . Dementia Maternal Grandfather   . Depression Maternal Grandfather    Allergies: Allergies  Allergen Reactions  . Peanut-Containing Drug Products Other (See Comments)    Childhood reaction   Medications: See med rec.  Review of Systems: No fevers, chills, night sweats, weight loss, chest pain, or shortness of breath.   Objective:    General: Well Developed, well nourished, and in no acute distress.  Neuro: Alert and oriented x3, extra-ocular muscles intact, sensation grossly intact.  HEENT: Normocephalic, atraumatic, pupils equal round reactive to light, neck supple, no masses, no lymphadenopathy, thyroid nonpalpable.   Skin: Warm and dry, no rashes. Cardiac: Regular rate and rhythm, no murmurs rubs or gallops, no lower extremity edema.  Respiratory: Clear to auscultation bilaterally. Not using accessory muscles, speaking in full sentences. Back Exam:  Inspection: Unremarkable  Motion: Flexion 45 deg, Extension 45 deg, Side Bending to 45 deg bilaterally,  Rotation to 45 deg bilaterally  SLR laying: Negative  XSLR laying: Negative  Palpable tenderness: None. FABER: negative. Sensory change: Gross sensation intact to all lumbar and sacral dermatomes.  Reflexes: 2+ at both patellar tendons, 2+ at achilles tendons, Babinski's downgoing.  Strength at foot  Plantar-flexion: 5/5 Dorsi-flexion: 5/5 Eversion: 5/5 Inversion: 5/5  Leg strength  Quad: 5/5 Hamstring: 5/5 Hip flexor: 5/5 Hip abductors: 5/5  Gait unremarkable.  Impression and Recommendations:    H/O degenerative disc disease History of lumbar DDD, bottom 2 levels. Had a recurrence of pain recently, with spasm up into the cervical spine. Adding prednisone, switching to Flexeril, adding meloxicam. X-rays of the cervical and lumbar spine. Physical therapy for cervical and lumbar spasm. Return to see me in 4 weeks.   MRIs if no better.  Preventive measure Tdap today. She has her Pap smear coming up. Adding routine labs.   ___________________________________________ Gwen Her. Dianah Field, M.D., ABFM., CAQSM. Primary Care and Sports Medicine [facility]  Adjunct Professor of [employer]

## 2019-02-07 NOTE — Assessment & Plan Note (Signed)
Tdap today. She has her Pap smear coming up. Adding routine labs.

## 2019-02-07 NOTE — Assessment & Plan Note (Signed)
History of lumbar DDD, bottom 2 levels. Had a recurrence of pain recently, with spasm up into the cervical spine. Adding prednisone, switching to Flexeril, adding meloxicam. X-rays of the cervical and lumbar spine. Physical therapy for cervical and lumbar spasm. Return to see me in 4 weeks.   MRIs if no better.

## 2019-02-13 DIAGNOSIS — F3342 Major depressive disorder, recurrent, in full remission: Secondary | ICD-10-CM | POA: Diagnosis not present

## 2019-02-13 DIAGNOSIS — F900 Attention-deficit hyperactivity disorder, predominantly inattentive type: Secondary | ICD-10-CM | POA: Diagnosis not present

## 2019-02-13 MED FILL — FLUoxetine HCL 40 MG CAPS: 40 | 90 days supply | Qty: 90 | Fill #0

## 2019-02-13 MED FILL — buPROPion HCL ER (XL) 150 M: 150 | 90 days supply | Qty: 90 | Fill #0

## 2019-02-21 DIAGNOSIS — L818 Other specified disorders of pigmentation: Secondary | ICD-10-CM | POA: Diagnosis not present

## 2019-02-21 MED FILL — DOXYCYCLINE MONOHYDRATE 100: 100 | 30 days supply | Qty: 30 | Fill #0

## 2019-03-07 ENCOUNTER — Ambulatory Visit: Payer: 59 | Admitting: Sports Medicine

## 2019-04-11 ENCOUNTER — Other Ambulatory Visit: Payer: Self-pay | Admitting: Sports Medicine

## 2019-04-11 DIAGNOSIS — Z8739 Personal history of other diseases of the musculoskeletal system and connective tissue: Secondary | ICD-10-CM

## 2019-04-11 MED ORDER — CYCLOBENZAPRINE HCL 10 MG PO TABS: ORAL_TABLET | ORAL | 0 refills | Status: DC

## 2019-04-11 MED FILL — MELOXICAM 15 MG TABLET: 15 | 30 days supply | Qty: 30 | Fill #1

## 2019-04-12 MED FILL — CYCLOBENZAPRINE HCL 10 MG T: 10 | 10 days supply | Qty: 30 | Fill #0

## 2019-05-02 DIAGNOSIS — F900 Attention-deficit hyperactivity disorder, predominantly inattentive type: Secondary | ICD-10-CM | POA: Diagnosis not present

## 2019-05-02 DIAGNOSIS — F410 Panic disorder [episodic paroxysmal anxiety] without agoraphobia: Secondary | ICD-10-CM | POA: Diagnosis not present

## 2019-05-02 DIAGNOSIS — F3342 Major depressive disorder, recurrent, in full remission: Secondary | ICD-10-CM | POA: Diagnosis not present

## 2019-07-18 ENCOUNTER — Other Ambulatory Visit: Payer: Self-pay | Admitting: Sports Medicine

## 2019-07-18 DIAGNOSIS — Z8739 Personal history of other diseases of the musculoskeletal system and connective tissue: Secondary | ICD-10-CM

## 2019-07-18 MED FILL — CYCLOBENZAPRINE HCL 10 MG T: 10 | 10 days supply | Qty: 30 | Fill #0

## 2019-09-06 ENCOUNTER — Encounter: Payer: Self-pay | Admitting: Medical-Surgical

## 2019-09-06 ENCOUNTER — Encounter: Payer: 59 | Admitting: Medical-Surgical

## 2019-09-06 ENCOUNTER — Ambulatory Visit (INDEPENDENT_AMBULATORY_CARE_PROVIDER_SITE_OTHER): Payer: 59 | Admitting: Medical-Surgical

## 2019-09-06 ENCOUNTER — Other Ambulatory Visit: Payer: Self-pay

## 2019-09-06 VITALS — BP 108/70 | HR 80 | Temp 98.3°F | Ht 67.0 in | Wt 318.4 lb

## 2019-09-06 DIAGNOSIS — Z0000 Encounter for general adult medical examination without abnormal findings: Secondary | ICD-10-CM

## 2019-09-06 DIAGNOSIS — Z111 Encounter for screening for respiratory tuberculosis: Secondary | ICD-10-CM

## 2019-09-06 LAB — QUANTIFERON TB GOLD ASSAY (BLOOD): QUANTIFERON(R)-TB GOLD: NEGATIVE

## 2019-09-06 NOTE — Patient Instructions (Signed)
Preventive Care 21-24 Years Old, Female Preventive care refers to visits with your health care provider and lifestyle choices that can promote health and wellness. This includes:  A yearly physical exam. This may also be called an annual well check.  Regular dental visits and eye exams.  Immunizations.  Screening for certain conditions.  Healthy lifestyle choices, such as eating a healthy diet, getting regular exercise, not using drugs or products that contain nicotine and tobacco, and limiting alcohol use. What can I expect for my preventive care visit? Physical exam Your health care provider will check your:  Height and weight. This may be used to calculate body mass index (BMI), which tells if you are at a healthy weight.  Heart rate and blood pressure.  Skin for abnormal spots. Counseling Your health care provider may ask you questions about your:  Alcohol, tobacco, and drug use.  Emotional well-being.  Home and relationship well-being.  Sexual activity.  Eating habits.  Work and work environment.  Method of birth control.  Menstrual cycle.  Pregnancy history. What immunizations do I need?  Influenza (flu) vaccine  This is recommended every year. Tetanus, diphtheria, and pertussis (Tdap) vaccine  You may need a Td booster every 10 years. Varicella (chickenpox) vaccine  You may need this if you have not been vaccinated. Human papillomavirus (HPV) vaccine  If recommended by your health care provider, you may need three doses over 6 months. Measles, mumps, and rubella (MMR) vaccine  You may need at least one dose of MMR. You may also need a second dose. Meningococcal conjugate (MenACWY) vaccine  One dose is recommended if you are age 19-21 years and a first-year college student living in a residence hall, or if you have one of several medical conditions. You may also need additional booster doses. Pneumococcal conjugate (PCV13) vaccine  You may need  this if you have certain conditions and were not previously vaccinated. Pneumococcal polysaccharide (PPSV23) vaccine  You may need one or two doses if you smoke cigarettes or if you have certain conditions. Hepatitis A vaccine  You may need this if you have certain conditions or if you travel or work in places where you may be exposed to hepatitis A. Hepatitis B vaccine  You may need this if you have certain conditions or if you travel or work in places where you may be exposed to hepatitis B. Haemophilus influenzae type b (Hib) vaccine  You may need this if you have certain conditions. You may receive vaccines as individual doses or as more than one vaccine together in one shot (combination vaccines). Talk with your health care provider about the risks and benefits of combination vaccines. What tests do I need?  Blood tests  Lipid and cholesterol levels. These may be checked every 5 years starting at age 20.  Hepatitis C test.  Hepatitis B test. Screening  Diabetes screening. This is done by checking your blood sugar (glucose) after you have not eaten for a while (fasting).  Sexually transmitted disease (STD) testing.  BRCA-related cancer screening. This may be done if you have a family history of breast, ovarian, tubal, or peritoneal cancers.  Pelvic exam and Pap test. This may be done every 3 years starting at age 21. Starting at age 30, this may be done every 5 years if you have a Pap test in combination with an HPV test. Talk with your health care provider about your test results, treatment options, and if necessary, the need for more tests.   Follow these instructions at home: Eating and drinking   Eat a diet that includes fresh fruits and vegetables, whole grains, lean protein, and low-fat dairy.  Take vitamin and mineral supplements as recommended by your health care provider.  Do not drink alcohol if: ? Your health care provider tells you not to drink. ? You are  pregnant, may be pregnant, or are planning to become pregnant.  If you drink alcohol: ? Limit how much you have to 0-1 drink a day. ? Be aware of how much alcohol is in your drink. In the [place] one drink equals one 12 oz bottle of beer (355 mL), one 5 oz glass of wine (148 mL), or one 1 oz glass of hard liquor (44 mL). Lifestyle  Take daily care of your teeth and gums.  Stay active. Exercise for at least 30 minutes on 5 or more days each week.  Do not use any products that contain nicotine or tobacco, such as cigarettes, e-cigarettes, and chewing tobacco. If you need help quitting, ask your health care provider.  If you are sexually active, practice safe sex. Use a condom or other form of birth control (contraception) in order to prevent pregnancy and STIs (sexually transmitted infections). If you plan to become pregnant, see your health care provider for a preconception visit. What's next?  Visit your health care provider once a year for a well check visit.  Ask your health care provider how often you should have your eyes and teeth checked.  Stay up to date on all vaccines. This information is not intended to replace advice given to you by your health care provider. Make sure you discuss any questions you have with your health care provider. Document Revised: 04/13/2018 Document Reviewed: 04/13/2018 Elsevier Patient Education  2020 [employer].

## 2019-09-06 NOTE — Progress Notes (Signed)
HPI: Emma Boyer is a 24 y.o. female who  has a past medical history of Bulging lumbar disc (2014), HA (headache), Low back pain, Neck pain, Pneumonia, Pseudopapilledema, Shoulder pain, and Vision abnormalities.  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 H/O degenerative disc disease 02/07/2019  . 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 Hemoptysis 09/07/2016  . De Quervain's tenosynovitis, bilateral 11/21/2015  . History of migraine headaches 08/29/2015  . Abnormal weight gain 01/27/2015  . Migraine headache 01/27/2015  . Pseudopapilledema   . Shoulder pain   . Chiari malformation type I ([place]) 04/10/2013  . Difficulty concentrating 09/08/2012    Past Surgical History:  Procedure Laterality Date  . None    . SUBOCCIPITAL CRANIECTOMY CERVICAL LAMINECTOMY N/A 05/01/2013   Procedure: Chiari Decompression;  Surgeon: Erline Levine, MD;  Location: [place] [facility];  Service: Neurosurgery;  Laterality: N/A;  Chiari Decompression    Social History   Tobacco Use  . Smoking status: Former Smoker    Packs/day: 0.10    Years: 7.00    Pack years: 0.70    Types: Cigarettes    Quit date: 03/18/2017    Years since quitting: 2.4  . Smokeless tobacco: Never Used  . Tobacco comment: Smokes about 2 per day.  Substance Use Topics  . Alcohol use: Yes    Alcohol/week: 1.0 standard drinks    Types: 1 Glasses  of wine per week    Comment: Occasionally    Family History  Problem Relation Age of Onset  . High blood pressure Father   . Arthritis Father   . Anxiety disorder Mother   . Depression Mother   . Anxiety disorder Sister   . Depression Sister   . Physical abuse Sister   . Sexual abuse Sister   . ADD / ADHD Maternal Aunt   . Anxiety disorder Maternal Aunt   . Depression Maternal Aunt   . Anxiety disorder Maternal Grandfather   . Dementia Maternal Grandfather   . Depression Maternal Grandfather      Current medication list and allergy/intolerance information reviewed:    Current Outpatient Medications  Medication Sig Dispense Refill  . cyclobenzaprine (FLEXERIL) 10 MG tablet TAKE 1/2 TABLET BY MOUTH EVERY NIGHT AT BEDTIME. INCREASE GRADUALLY TO 1 TABLET THREE TIMES DAILY 30 tablet 0  . FLUoxetine (PROZAC) 40 MG capsule Take 1 capsule (40 mg total) by mouth daily. 90 capsule 0  . meloxicam (MOBIC) 15 MG tablet One tab PO qAM with breakfast for 2 weeks, then daily prn pain. 30 tablet 3  . methylphenidate (RITALIN) 10 MG tablet 20 mg in AM and 10 mg at noon 90 tablet 0  . Omega-3 Fatty Acids (FISH OIL) 1000 MG CAPS Take by mouth.    . predniSONE (DELTASONE) 50 MG tablet One tab PO daily for 5 days. 5 tablet 0   No current facility-administered medications for this visit.    Allergies  Allergen Reactions  . Peanut-Containing Drug Products Other (See  Comments)    Childhood reaction      Review of Systems:  Constitutional:  No  fever, no chills, No recent illness, No unintentional weight changes. No significant fatigue.   HEENT: No vision change, no hearing change, No sore throat, No  sinus pressure. Chronic intermittent nonintractable headaches approx 1x weekly.  Cardiac: No  chest pain, No  pressure, No palpitations, No  Orthopnea  Respiratory:  No  shortness of breath. No  Cough  Gastrointestinal: No  abdominal pain, No vomiting,  No  blood in stool, No  diarrhea, No   Constipation. Some AM nausea if eating food early.   Musculoskeletal: No new myalgia/arthralgia  Skin: No  Rash, No other wounds/concerning lesions  Genitourinary: No  incontinence, No  abnormal genital bleeding, No abnormal genital discharge  Hem/Onc: No  easy bruising/bleeding, No  abnormal lymph node  Endocrine: No cold intolerance,  No heat intolerance. No polyuria/polydipsia/polyphagia   Neurologic: No  weakness, No  dizziness, No  slurred speech/focal weakness/facial droop  Psychiatric: Depression and ADHD managed by Dr. Daron Offer. No anxiety.  No sleep problems, No mood problems  Exam:  BP 108/70   Pulse 80   Temp 98.3 F (36.8 C) (Oral)   Ht '5\' 7"'  (1.702 m)   Wt (!) 318 lb 6.4 oz (144.4 kg)   SpO2 98%   BMI 49.87 kg/m   Constitutional: VS see above. General Appearance: alert, well-developed, well-nourished, NAD  Eyes: Normal lids and conjunctive, non-icteric sclera  Ears, Nose, Mouth, Throat: MMM, Normal external inspection ears/nares/mouth/lips/gums. TM normal bilaterally. Pharynx/tonsils no erythema, no exudate. Nasal mucosa normal.   Neck: No masses, trachea midline. No thyroid enlargement. No tenderness/mass appreciated. No lymphadenopathy  Respiratory: Normal respiratory effort. no wheeze, no rhonchi, no rales  Cardiovascular: S1/S2 normal, no murmur, no rub/gallop auscultated. RRR. No lower extremity edema. Pedal pulse II/IV bilaterally DP and PT. No carotid bruit or JVD. No abdominal aortic bruit.  Gastrointestinal: Nontender, no masses. No hepatomegaly, no splenomegaly. No hernia appreciated. Bowel sounds normal. Rectal exam deferred.   Musculoskeletal: Gait normal. No clubbing/cyanosis of digits.   Neurological: Normal balance/coordination. No tremor. No cranial nerve deficit on limited exam. Motor and sensation intact and symmetric. Cerebellar reflexes intact.   Skin: warm, dry, intact. No rash/ulcer. No concerning nevi or subq nodules on limited exam.     Psychiatric: Normal judgment/insight. Normal mood and affect. Oriented x3.    No results found for this or any previous visit (from the past 72 hour(s)).  No results found.   ASSESSMENT/PLAN:    Annual physical exam  Checking CBC, TSH, lipids, CMP, and HgbA1c.  We will also check QuantiFERON gold for TB screening for her new job.  Employment physical form, TB forms completed.  Once results are back for QuantiFERON test, will complete last form and uploaded to MyChart for patient access.  Reviewed healthy living recommendations including diet, weight loss, and increased exercise.  Will make appointment with OB/GYN office for Pap smear.  Orders Placed This Encounter  Procedures  . CBC  . TSH  . Lipid panel  . COMPLETE METABOLIC PANEL WITH GFR  . HgB A1c  . QuantiFERON-TB Gold Plus    Patient Instructions  Preventive Care 35-69 Years Old, Female Preventive care refers to visits with your health care provider and lifestyle choices that can promote health and wellness. This includes:  A yearly physical exam. This may also be called an annual well check.  Regular dental visits and eye exams.  Immunizations.  Screening for certain conditions.  Healthy lifestyle choices, such as eating a healthy diet, getting regular exercise, not using drugs or products that contain nicotine and tobacco, and limiting alcohol use. What can I expect for my preventive care visit? Physical exam Your health care provider will check your:  Height and weight. This may be used to calculate body mass index (BMI), which tells if you are at a healthy weight.  Heart rate and blood pressure.  Skin for abnormal spots. Counseling Your health care provider may ask you questions about your:  Alcohol, tobacco, and drug use.  Emotional well-being.  Home and relationship well-being.  Sexual activity.  Eating habits.  Work and work Statistician.  Method of birth control.  Menstrual  cycle.  Pregnancy history. What immunizations do I need?  Influenza (flu) vaccine  This is recommended every year. Tetanus, diphtheria, and pertussis (Tdap) vaccine  You may need a Td booster every 10 years. Varicella (chickenpox) vaccine  You may need this if you have not been vaccinated. Human papillomavirus (HPV) vaccine  If recommended by your health care provider, you may need three doses over 6 months. Measles, mumps, and rubella (MMR) vaccine  You may need at least one dose of MMR. You may also need a second dose. Meningococcal conjugate (MenACWY) vaccine  One dose is recommended if you are age 22-21 years and a first-year college student living in a residence hall, or if you have one of several medical conditions. You may also need additional booster doses. Pneumococcal conjugate (PCV13) vaccine  You may need this if you have certain conditions and were not previously vaccinated. Pneumococcal polysaccharide (PPSV23) vaccine  You may need one or two doses if you smoke cigarettes or if you have certain conditions. Hepatitis A vaccine  You may need this if you have certain conditions or if you travel or work in places where you may be exposed to hepatitis A. Hepatitis B vaccine  You may need this if you have certain conditions or if you travel or work in places where you may be exposed to hepatitis B. Haemophilus influenzae type b (Hib) vaccine  You may need this if you have certain conditions. You may receive vaccines as individual doses or as more than one vaccine together in one shot (combination vaccines). Talk with your health care provider about the risks and benefits of combination vaccines. What tests do I need?  Blood tests  Lipid and cholesterol levels. These may be checked every 5 years starting at age 11.  Hepatitis C test.  Hepatitis B test. Screening  Diabetes screening. This is done by checking your blood sugar (glucose) after you have not eaten  for a while (fasting).  Sexually transmitted disease (STD) testing.  BRCA-related cancer screening. This may be done if you have a family history of breast, ovarian, tubal, or peritoneal cancers.  Pelvic exam and Pap test. This may be done every 3 years starting at age 31. Starting at age 60, this may be done every 5 years if you have a Pap test in combination with an HPV test. Talk with your health care provider about your test results, treatment options, and if necessary, the need for more tests. Follow these instructions at home: Eating and drinking   Eat a diet that includes fresh fruits and vegetables, whole grains, lean protein, and low-fat dairy.  Take vitamin and mineral supplements as recommended by your health care provider.  Do not drink alcohol if: ? Your health  care provider tells you not to drink. ? You are pregnant, may be pregnant, or are planning to become pregnant.  If you drink alcohol: ? Limit how much you have to 0-1 drink a day. ? Be aware of how much alcohol is in your drink. In the [place] one drink equals one 12 oz bottle of beer (355 mL), one 5 oz glass of wine (148 mL), or one 1 oz glass of hard liquor (44 mL). Lifestyle  Take daily care of your teeth and gums.  Stay active. Exercise for at least 30 minutes on 5 or more days each week.  Do not use any products that contain nicotine or tobacco, such as cigarettes, e-cigarettes, and chewing tobacco. If you need help quitting, ask your health care provider.  If you are sexually active, practice safe sex. Use a condom or other form of birth control (contraception) in order to prevent pregnancy and STIs (sexually transmitted infections). If you plan to become pregnant, see your health care provider for a preconception visit. What's next?  Visit your health care provider once a year for a well check visit.  Ask your health care provider how often you should have your eyes and teeth checked.  Stay up to date  on all vaccines. This information is not intended to replace advice given to you by your health care provider. Make sure you discuss any questions you have with your health care provider. Document Revised: 04/13/2018 Document Reviewed: 04/13/2018 Elsevier Patient Education  [place].   Follow-up plan: Return in about 1 year (around 09/05/2020) for next annual physical exam.

## 2019-09-08 LAB — CBC
HCT: 41.6 % (ref 35.0–45.0)
Hemoglobin: 13.9 g/dL (ref 11.7–15.5)
MCH: 27.5 pg (ref 27.0–33.0)
MCHC: 33.4 g/dL (ref 32.0–36.0)
MCV: 82.2 fL (ref 80.0–100.0)
MPV: 11.2 fL (ref 7.5–12.5)
Platelets: 252 10*3/uL (ref 140–400)
RBC: 5.06 10*6/uL (ref 3.80–5.10)
RDW: 13 % (ref 11.0–15.0)
WBC: 12 10*3/uL — ABNORMAL HIGH (ref 3.8–10.8)

## 2019-09-08 LAB — COMPLETE METABOLIC PANEL WITH GFR
AG Ratio: 1.8 (calc) (ref 1.0–2.5)
ALT: 12 U/L (ref 6–29)
AST: 12 U/L (ref 10–30)
Albumin: 4.2 g/dL (ref 3.6–5.1)
Alkaline phosphatase (APISO): 75 U/L (ref 31–125)
BUN: 9 mg/dL (ref 7–25)
CO2: 24 mmol/L (ref 20–32)
Calcium: 9.5 mg/dL (ref 8.6–10.2)
Chloride: 104 mmol/L (ref 98–110)
Creat: 0.71 mg/dL (ref 0.50–1.10)
GFR, Est African American: 138 mL/min/{1.73_m2} (ref >=60)
GFR, Est Non African American: 119 mL/min/{1.73_m2} (ref >=60)
Globulin: 2.4 g/dL (calc) (ref 1.9–3.7)
Glucose, Bld: 77 mg/dL (ref 65–99)
Potassium: 4.4 mmol/L (ref 3.5–5.3)
Sodium: 138 mmol/L (ref 135–146)
Total Bilirubin: 0.5 mg/dL (ref 0.2–1.2)
Total Protein: 6.6 g/dL (ref 6.1–8.1)

## 2019-09-08 LAB — LIPID PANEL
Cholesterol: 190 mg/dL (ref <200)
HDL: 47 mg/dL — ABNORMAL LOW (ref >=50)
LDL Cholesterol (Calc): 123 mg/dL (calc) — ABNORMAL HIGH
Non-HDL Cholesterol (Calc): 143 mg/dL (calc) — ABNORMAL HIGH (ref <130)
Total CHOL/HDL Ratio: 4 (calc) (ref <5.0)
Triglycerides: 98 mg/dL (ref <150)

## 2019-09-08 LAB — QUANTIFERON-TB GOLD PLUS
Mitogen-NIL: >10 IU/mL
NIL: 0.02 IU/mL
QuantiFERON-TB Gold Plus: NEGATIVE
TB1-NIL: 0 IU/mL
TB2-NIL: 0 IU/mL

## 2019-09-08 LAB — HEMOGLOBIN A1C
Hgb A1c MFr Bld: 5.3 % of total Hgb (ref <5.7)
Mean Plasma Glucose: 105 (calc)
eAG (mmol/L): 5.8 (calc)

## 2019-09-08 LAB — TSH: TSH: 2 mIU/L

## 2019-09-11 MED FILL — FLUoxetine HCL 40 MG CAPS: 40 | 30 days supply | Qty: 30 | Fill #0

## 2019-10-09 ENCOUNTER — Encounter: Payer: Self-pay | Admitting: Family Medicine

## 2019-10-09 ENCOUNTER — Telehealth (INDEPENDENT_AMBULATORY_CARE_PROVIDER_SITE_OTHER): Payer: 59 | Admitting: Family Medicine

## 2019-10-09 ENCOUNTER — Other Ambulatory Visit: Payer: Self-pay

## 2019-10-09 VITALS — BP 118/74 | HR 93 | Temp 99.0°F

## 2019-10-09 DIAGNOSIS — R519 Headache, unspecified: Secondary | ICD-10-CM | POA: Diagnosis not present

## 2019-10-09 DIAGNOSIS — R5383 Other fatigue: Secondary | ICD-10-CM | POA: Diagnosis not present

## 2019-10-09 DIAGNOSIS — Z03818 Encounter for observation for suspected exposure to other biological agents ruled out: Secondary | ICD-10-CM | POA: Diagnosis not present

## 2019-10-09 DIAGNOSIS — J069 Acute upper respiratory infection, unspecified: Secondary | ICD-10-CM

## 2019-10-09 DIAGNOSIS — Z20828 Contact with and (suspected) exposure to other viral communicable diseases: Secondary | ICD-10-CM | POA: Diagnosis not present

## 2019-10-09 DIAGNOSIS — Z20822 Contact with and (suspected) exposure to covid-19: Secondary | ICD-10-CM | POA: Diagnosis not present

## 2019-10-09 DIAGNOSIS — J029 Acute pharyngitis, unspecified: Secondary | ICD-10-CM | POA: Diagnosis not present

## 2019-10-09 NOTE — Progress Notes (Signed)
Virtual Visit via Video Note  I connected with Emma Boyer on 10/09/19 at  4:20 PM EST by a video enabled telemedicine application and verified that I am speaking with the correct person using two identifiers.   I discussed the limitations of evaluation and management by telemedicine and the availability of in person appointments. The patient expressed understanding and agreed to proceed.  Subjective:    CC: ST and Cough   HPI: Started feeling bad on Friday and has had a ST and cough.  She says it started on Friday with a sore throat.  She has now developed a cough.  But she says it is quite severe and she feels like it sore all the way into her chest.  She said that she did have a low-grade temperature of 99.  She is also had some loose stools and diarrhea.  She felt like her neck was tender today when she would touch it.  She does work in a daycare. She went to [facility] for a rapid COVID test and it was negative.  He also tested negative for strep throat as well as influenza.  She still wonders if she could have Covid.  She had 3 coworkers that were also positive.   Past medical history, Surgical history, Family history not pertinant except as noted below, Social history, Allergies, and medications have been entered into the medical record, reviewed, and corrections made.   Review of Systems: No fevers, chills, night sweats, weight loss, chest pain, or shortness of breath.   Objective:    General: Speaking clearly in complete sentences without any shortness of breath.  Alert and oriented x3.  Normal judgment. No apparent acute distress.    Impression and Recommendations:    No problem-specific Assessment & Plan notes found for this encounter.  Viral URI - likely viral. Recommend symptomatic care.  We'll get her scheduled for PCR Covid test tomorrow especially with positive exposures at work.  If at any point she develops any new symptoms such as shortness of breath or worsening symptoms  then please let us know.    Time spent in encounter 22 minutes  I discussed the assessment and treatment plan with the patient. The patient was provided an opportunity to ask questions and all were answered. The patient agreed with the plan and demonstrated an understanding of the instructions.   The patient was advised to call back or seek an in-person evaluation if the symptoms worsen or if the condition fails to improve as anticipated.   Nani Gasser, MD

## 2019-10-09 NOTE — Progress Notes (Signed)
She reports that Friday she felt like she had a cold. Went to work yesterday and her sxs got worse. Throat pain and body aches. She denies any SOB but stated that it hurts to breathe.   She had a rapid covid test done today @ [facility] and this was negative. She is mainly concerned about the cough and sore throat.

## 2019-10-10 ENCOUNTER — Ambulatory Visit: Payer: 59

## 2019-10-10 ENCOUNTER — Telehealth: Payer: Self-pay

## 2019-10-10 DIAGNOSIS — J069 Acute upper respiratory infection, unspecified: Secondary | ICD-10-CM | POA: Diagnosis not present

## 2019-10-10 DIAGNOSIS — Z20822 Contact with and (suspected) exposure to covid-19: Secondary | ICD-10-CM | POA: Diagnosis not present

## 2019-10-10 NOTE — Addendum Note (Signed)
Addended by: Chalmers Cater on: 10/10/2019 01:38 PM   Modules accepted: Orders

## 2019-10-10 NOTE — Telephone Encounter (Signed)
I called and left a message for patient to have a drive by [id number] swab.

## 2019-10-11 ENCOUNTER — Encounter: Payer: Self-pay | Admitting: Family Medicine

## 2019-10-11 LAB — NOVEL CORONAVIRUS, NAA: SARS-CoV-2, NAA: NOT DETECTED

## 2019-10-12 MED ORDER — AZITHROMYCIN 250 MG PO TABS: ORAL_TABLET | ORAL | 0 refills | Status: AC

## 2019-10-12 MED ORDER — ALBUTEROL SULFATE HFA 108 (90 BASE) MCG/ACT IN AERS: 2.0000 | INHALATION_SPRAY | Freq: Four times a day (QID) | RESPIRATORY_TRACT | 0 refills | Status: DC | PRN

## 2019-10-12 NOTE — Telephone Encounter (Signed)
I think [facility] saw this patient.  She will have a better idea of how to address this.

## 2019-10-12 NOTE — Telephone Encounter (Signed)
ABX and inhaler sent

## 2019-10-12 NOTE — Telephone Encounter (Signed)
Pt advised. She states that she is scheduled to do a CPR class at work on Wednesday. I advised her that she should speak w/her supervisor to see if she can be rescheduled for a later date. She said that she will ask them about doing this on Monday.Loralee Pacas [place]

## 2019-10-16 ENCOUNTER — Telehealth: Payer: 59 | Admitting: Family Medicine

## 2019-12-07 ENCOUNTER — Telehealth: Payer: 59 | Admitting: Nurse Practitioner

## 2019-12-07 DIAGNOSIS — J0110 Acute frontal sinusitis, unspecified: Secondary | ICD-10-CM

## 2019-12-07 MED ORDER — AMOXICILLIN-POT CLAVULANATE 875-125 MG PO TABS: 1.0000 | ORAL_TABLET | Freq: Two times a day (BID) | ORAL | 0 refills | Status: DC

## 2019-12-07 NOTE — Progress Notes (Signed)

## 2019-12-10 ENCOUNTER — Encounter: Payer: Self-pay | Admitting: Family Medicine

## 2019-12-10 ENCOUNTER — Ambulatory Visit (INDEPENDENT_AMBULATORY_CARE_PROVIDER_SITE_OTHER): Payer: 59 | Admitting: Family Medicine

## 2019-12-10 ENCOUNTER — Other Ambulatory Visit: Payer: Self-pay

## 2019-12-10 VITALS — BP 137/84 | HR 76 | Temp 98.1°F | Ht 67.0 in | Wt 320.0 lb

## 2019-12-10 DIAGNOSIS — R519 Headache, unspecified: Secondary | ICD-10-CM

## 2019-12-10 DIAGNOSIS — G43009 Migraine without aura, not intractable, without status migrainosus: Secondary | ICD-10-CM | POA: Diagnosis not present

## 2019-12-10 MED ORDER — TIZANIDINE HCL 4 MG PO TABS: 4.0000 mg | ORAL_TABLET | Freq: Four times a day (QID) | ORAL | 0 refills | Status: DC | PRN

## 2019-12-10 MED ORDER — ONDANSETRON HCL 4 MG PO TABS
4.0000 mg | ORAL_TABLET | Freq: Once | ORAL | Status: AC
  Administered 2019-12-10: 4 mg via ORAL

## 2019-12-10 MED ORDER — KETOROLAC TROMETHAMINE 60 MG/2ML IM SOLN
60.0000 mg | Freq: Once | INTRAMUSCULAR | Status: AC
  Administered 2019-12-10: 60 mg via INTRAMUSCULAR

## 2019-12-10 NOTE — Patient Instructions (Signed)

## 2019-12-10 NOTE — Progress Notes (Signed)
Emma Boyer - 24 y.o. female MRN [id number]  Date of birth: Sep 26, 1995  Subjective Chief Complaint  Patient presents with  . Headache    HPI Emma Boyer is a 24 y.o. female with history of chiari I malformation s/p suboccipital craniectomy and chronic migraines here today with complaint of migraine.  She reports that current migraine started a few days ago.   She has virtual visit and was prescribed augmentin to treat possible sinus infection.  She has taken this for a few days without improvement.  Current headache associated with nausea and light sensitivity.  She has taken tizanidine in the past to help with headaches and this worked well for her. She denies vision changes, weakness, or other neuro changes.   ROS:  A comprehensive ROS was completed and negative except as noted per HPI  Allergies  Allergen Reactions  . Peanut-Containing Drug Products Other (See Comments)    Childhood reaction    Past Medical History:  Diagnosis Date  . Bulging lumbar disc 2014  . HA (headache)   . Low back pain   . Neck pain   . Pneumonia   . Pseudopapilledema   . Shoulder pain   . Vision abnormalities    wears contacts, right eye worse    Past Surgical History:  Procedure Laterality Date  . None    . SUBOCCIPITAL CRANIECTOMY CERVICAL LAMINECTOMY N/A 05/01/2013   Procedure: Chiari Decompression;  Surgeon: Maeola Harman, MD;  Location: [facility];  Service: Neurosurgery;  Laterality: N/A;  Chiari Decompression    Social History   Socioeconomic History  . Marital status: Single    Spouse name: Not on file  . Number of children: 0  . Years of education: Not on file  . Highest education level: Not on file  Occupational History    Comment: [employer]  Tobacco Use  . Smoking status: Former Smoker    Packs/day: 0.10    Years: 7.00    Pack years: 0.70    Types: Cigarettes    Quit date: 03/18/2017    Years since quitting: 2.7  . Smokeless tobacco: Never Used  . Tobacco  comment: Smokes about 2 per day.  Substance and Sexual Activity  . Alcohol use: Yes    Alcohol/week: 1.0 standard drinks    Types: 1 Glasses of wine per week    Comment: Occasionally  . Drug use: No  . Sexual activity: Yes    Partners: Male    Birth control/protection: I.U.D.  Other Topics Concern  . Not on file  Social History Narrative   Patient lives at home with parents . 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    Right handed.   Caffeine- One cup of coffee daily and one soda.    Social Determinants of Health   Financial Resource Strain:   . Difficulty of Paying Living Expenses:   Food Insecurity:   . Worried About Programme researcher, broadcasting/film/video in the Last Year:   . Barista in the Last Year:   Transportation Needs:   . Freight forwarder (Medical):   Marland Kitchen Lack of Transportation (Non-Medical):   Physical Activity:   . Days of Exercise per Week:   . Minutes of Exercise per Session:   Stress:   . Feeling of Stress :   Social Connections:   . Frequency of Communication with Friends and Family:   . Frequency of Social Gatherings with Friends and  Family:   . Attends Religious Services:   . Active Member of Clubs or Organizations:   . Attends Archivist Meetings:   Marland Kitchen Marital Status:     Family History  Problem Relation Age of Onset  . High blood pressure Father   . Arthritis Father   . Anxiety disorder Mother   . Depression Mother   . Anxiety disorder Sister   . Depression Sister   . Physical abuse Sister   . Sexual abuse Sister   . ADD / ADHD Maternal Aunt   . Anxiety disorder Maternal Aunt   . Depression Maternal Aunt   . Anxiety disorder Maternal Grandfather   . Dementia Maternal Grandfather   . Depression Maternal Grandfather     Health Maintenance  Topic Date Due  . HIV Screening  Never done  . COVID-19 Vaccine (1) Never done  . PAP-Cervical Cytology Screening  Never done  . PAP SMEAR-Modifier  Never done  .  INFLUENZA VACCINE  03/16/2020  . TETANUS/TDAP  02/06/2029     ----------------------------------------------------------------------------------------------------------------------------------------------------------------------------------------------------------------- Physical Exam BP 137/84   Pulse 76   Temp 98.1 F (36.7 C) (Oral)   Ht 5\' 7"  (1.702 m)   Wt (!) 320 lb (145.2 kg)   BMI 50.12 kg/m   Physical Exam Constitutional:      Appearance: Normal appearance.  HENT:     Head: Normocephalic and atraumatic.  Eyes:     General: No scleral icterus.    Extraocular Movements: Extraocular movements intact.     Pupils: Pupils are equal, round, and reactive to light.  Cardiovascular:     Rate and Rhythm: Normal rate and regular rhythm.  Pulmonary:     Effort: Pulmonary effort is normal.     Breath sounds: Normal breath sounds.  Musculoskeletal:     Cervical back: Neck supple.  Skin:    General: Skin is warm and dry.     Findings: No rash.  Neurological:     General: No focal deficit present.     Mental Status: She is alert and oriented to person, place, and time.     Cranial Nerves: No cranial nerve deficit.     Motor: No weakness.     Coordination: Coordination normal.     Gait: Gait normal.  Psychiatric:        Mood and Affect: Mood normal.        Behavior: Behavior normal.     ------------------------------------------------------------------------------------------------------------------------------------------------------------------------------------------------------------------- Assessment and Plan  Migraine headache Given injection of toradol 60mg  and ODT zofran 4mg  today Recommend rest this evening Restart tizanidine.  Remain well hydrated. Call if headaches persist.       Meds ordered this encounter  Medications  . tiZANidine (ZANAFLEX) 4 MG tablet    Sig: Take 1 tablet (4 mg total) by mouth every 6 (six) hours as needed for muscle spasms (or  headaches).    Dispense:  30 tablet    Refill:  0  . ondansetron (ZOFRAN) tablet 4 mg  . ketorolac (TORADOL) injection 60 mg    No follow-ups on file.    This visit occurred during the SARS-CoV-2 public health emergency.  Safety protocols were in place, including screening questions prior to the visit, additional usage of staff PPE, and extensive cleaning of exam room while observing appropriate contact time as indicated for disinfecting solutions.

## 2019-12-10 NOTE — Assessment & Plan Note (Signed)
Given injection of toradol 60mg  and ODT zofran 4mg  today Recommend rest this evening Restart tizanidine.  Remain well hydrated. Call if headaches persist.

## 2019-12-26 DIAGNOSIS — H5213 Myopia, bilateral: Secondary | ICD-10-CM | POA: Diagnosis not present

## 2019-12-26 DIAGNOSIS — H04123 Dry eye syndrome of bilateral lacrimal glands: Secondary | ICD-10-CM | POA: Diagnosis not present

## 2019-12-30 DIAGNOSIS — F1210 Cannabis abuse, uncomplicated: Secondary | ICD-10-CM | POA: Diagnosis not present

## 2019-12-30 DIAGNOSIS — F900 Attention-deficit hyperactivity disorder, predominantly inattentive type: Secondary | ICD-10-CM | POA: Diagnosis not present

## 2019-12-30 DIAGNOSIS — F331 Major depressive disorder, recurrent, moderate: Secondary | ICD-10-CM | POA: Diagnosis not present

## 2019-12-30 DIAGNOSIS — F410 Panic disorder [episodic paroxysmal anxiety] without agoraphobia: Secondary | ICD-10-CM | POA: Diagnosis not present

## 2019-12-31 MED FILL — METHYLPHENIDATE HCL ER 20 M: 20 | 90 days supply | Qty: 90 | Fill #0

## 2019-12-31 MED FILL — PROPRANOLOL 10 MG TABLET: 10 | 84 days supply | Qty: 36 | Fill #0

## 2020-01-01 MED FILL — ADAPALENE 0.3% GEL: 0.3 | 30 days supply | Qty: 45 | Fill #0

## 2020-01-02 MED FILL — SERTRALINE HCL 100 MG TAB: 100 | 90 days supply | Qty: 90 | Fill #0

## 2020-03-18 DIAGNOSIS — Z20822 Contact with and (suspected) exposure to covid-19: Secondary | ICD-10-CM | POA: Diagnosis not present

## 2020-03-19 ENCOUNTER — Telehealth: Payer: Self-pay

## 2020-03-19 NOTE — Telephone Encounter (Signed)
Thank you and excellent advice.

## 2020-03-19 NOTE — Telephone Encounter (Signed)
Pt called stating she has tested positive for Covid. Per pt, she was exposed by two of her co-workers who tested positive. Pt did get her first Covid vaccine on 03/15/20. Pt was informed to follow protocols regarding self quarantine. She is aware to go to the nearest ER if her symptoms worsen or has respiratory issues.

## 2020-04-06 DIAGNOSIS — F1210 Cannabis abuse, uncomplicated: Secondary | ICD-10-CM | POA: Diagnosis not present

## 2020-04-06 DIAGNOSIS — F900 Attention-deficit hyperactivity disorder, predominantly inattentive type: Secondary | ICD-10-CM | POA: Diagnosis not present

## 2020-04-06 DIAGNOSIS — F331 Major depressive disorder, recurrent, moderate: Secondary | ICD-10-CM | POA: Diagnosis not present

## 2020-04-06 DIAGNOSIS — F410 Panic disorder [episodic paroxysmal anxiety] without agoraphobia: Secondary | ICD-10-CM | POA: Diagnosis not present

## 2020-04-07 MED FILL — METHYLPHENIDATE HCL ER 20 M: 20 | 90 days supply | Qty: 90 | Fill #0

## 2020-04-07 MED FILL — SERTRALINE HCL 100 MG TABS: 100 | 90 days supply | Qty: 90 | Fill #0

## 2020-05-09 ENCOUNTER — Other Ambulatory Visit: Payer: Self-pay

## 2020-05-09 MED ORDER — TIZANIDINE HCL 4 MG PO TABS: 4.0000 mg | ORAL_TABLET | Freq: Four times a day (QID) | ORAL | 0 refills | Status: DC | PRN

## 2020-05-09 MED FILL — tiZANidine HCL 4 MG TABS: 4 | 7 days supply | Qty: 30 | Fill #0

## 2020-05-09 NOTE — Telephone Encounter (Signed)
Emma Boyer called and left a message requesting a refill on Tizanidine. She would like it sent to [facility].

## 2020-05-19 MED FILL — METHYLPHENIDATE HCL ER 20 M: 20 | 90 days supply | Qty: 90 | Fill #0

## 2020-05-19 MED FILL — SERTRALINE HCL 100 MG TABS: 100 | 90 days supply | Qty: 90 | Fill #0

## 2020-07-13 ENCOUNTER — Other Ambulatory Visit (HOSPITAL_COMMUNITY): Payer: Self-pay | Admitting: Psychiatry

## 2020-07-13 DIAGNOSIS — F900 Attention-deficit hyperactivity disorder, predominantly inattentive type: Secondary | ICD-10-CM | POA: Diagnosis not present

## 2020-07-13 DIAGNOSIS — F410 Panic disorder [episodic paroxysmal anxiety] without agoraphobia: Secondary | ICD-10-CM | POA: Diagnosis not present

## 2020-07-13 DIAGNOSIS — F1210 Cannabis abuse, uncomplicated: Secondary | ICD-10-CM | POA: Diagnosis not present

## 2020-07-13 DIAGNOSIS — F331 Major depressive disorder, recurrent, moderate: Secondary | ICD-10-CM | POA: Diagnosis not present

## 2020-07-14 MED FILL — SERTRALINE HCL 100 MG TABS: 100 | 90 days supply | Qty: 180 | Fill #0

## 2020-07-14 MED FILL — PROPRANOLOL 10 MG TABLET: 10 | 84 days supply | Qty: 36 | Fill #0

## 2020-08-13 MED FILL — SERTRALINE HCL 100 MG TABS: 100 | 90 days supply | Qty: 180 | Fill #0

## 2020-08-25 ENCOUNTER — Telehealth: Payer: 59 | Admitting: Family

## 2020-08-25 DIAGNOSIS — J069 Acute upper respiratory infection, unspecified: Secondary | ICD-10-CM | POA: Diagnosis not present

## 2020-08-25 MED ORDER — BENZONATATE 100 MG PO CAPS: 100.0000 mg | ORAL_CAPSULE | Freq: Three times a day (TID) | ORAL | 0 refills | Status: DC | PRN

## 2020-08-25 MED ORDER — FLUTICASONE PROPIONATE 50 MCG/ACT NA SUSP: 2.0000 | Freq: Every day | NASAL | 6 refills | Status: DC

## 2020-08-25 NOTE — Progress Notes (Signed)
We are sorry you are not feeling well.  Here is how we plan to help!  Based on what you have shared with me, it looks like you may have a viral upper respiratory infection.  Upper respiratory infections are caused by a large number of viruses; however, rhinovirus is the most common cause.   Given your symptoms, you also need to be tested for COVID to rule out.   Symptoms vary from person to person, with common symptoms including sore throat, cough, fatigue or lack of energy and feeling of general discomfort.  A low-grade fever of up to 100.4 may present, but is often uncommon.  Symptoms vary however, and are closely related to a person's age or underlying illnesses.  The most common symptoms associated with an upper respiratory infection are nasal discharge or congestion, cough, sneezing, headache and pressure in the ears and face.  These symptoms usually persist for about 3 to 10 days, but can last up to 2 weeks.  It is important to know that upper respiratory infections do not cause serious illness or complications in most cases.    Upper respiratory infections can be transmitted from person to person, with the most common method of transmission being a person's hands.  The virus is able to live on the skin and can infect other persons for up to 2 hours after direct contact.  Also, these can be transmitted when someone coughs or sneezes; thus, it is important to cover the mouth to reduce this risk.  To keep the spread of the illness at bay, good hand hygiene is very important.  This is an infection that is most likely caused by a virus. There are no specific treatments other than to help you with the symptoms until the infection runs its course.  We are sorry you are not feeling well.  Here is how we plan to help!   For nasal congestion, you may use an oral decongestants such as Mucinex D or if you have glaucoma or high blood pressure use plain Mucinex.  Saline nasal spray or nasal drops can help and  can safely be used as often as needed for congestion.  For your congestion, I have prescribed Fluticasone nasal spray one spray in each nostril twice a day  If you do not have a history of heart disease, hypertension, diabetes or thyroid disease, prostate/bladder issues or glaucoma, you may also use Sudafed to treat nasal congestion.  It is highly recommended that you consult with a pharmacist or your primary care physician to ensure this medication is safe for you to take.     If you have a cough, you may use cough suppressants such as Delsym and Robitussin.  If you have glaucoma or high blood pressure, you can also use Coricidin HBP.   For cough I have prescribed for you A prescription cough medication called Tessalon Perles 100 mg. You may take 1-2 capsules every 8 hours as needed for cough  If you have a sore or scratchy throat, use a saltwater gargle-  to  teaspoon of salt dissolved in a 4-ounce to 8-ounce glass of warm water.  Gargle the solution for approximately 15-30 seconds and then spit.  It is important not to swallow the solution.  You can also use throat lozenges/cough drops and Chloraseptic spray to help with throat pain or discomfort.  Warm or cold liquids can also be helpful in relieving throat pain.  For headache, pain or general discomfort, you can use Ibuprofen  Ibuprofen or Tylenol as directed.   Some authorities believe that zinc sprays or the use of Echinacea may shorten the course of your symptoms.   HOME CARE . Only take medications as instructed by your medical team. . Be sure to drink plenty of fluids. Water is fine as well as fruit juices, sodas and electrolyte beverages. You may want to stay away from caffeine or alcohol. If you are nauseated, try taking small sips of liquids. How do you know if you are getting enough fluid? Your urine should be a pale yellow or almost colorless. . Get rest. . Taking a steamy shower or using a humidifier may help nasal congestion and ease  sore throat pain. You can place a towel over your head and breathe in the steam from hot water coming from a faucet. . Using a saline nasal spray works much the same way. . Cough drops, hard candies and sore throat lozenges may ease your cough. . Avoid close contacts especially the very young and the elderly . Cover your mouth if you cough or sneeze . Always remember to wash your hands.   GET HELP RIGHT AWAY IF: . You develop worsening fever. . If your symptoms do not improve within 10 days . You develop yellow or green discharge from your nose over 3 days. . You have coughing fits . You develop a severe head ache or visual changes. . You develop shortness of breath, difficulty breathing or start having chest pain . Your symptoms persist after you have completed your treatment plan  MAKE SURE YOU   Understand these instructions.  Will watch your condition.  Will get help right away if you are not doing well or get worse.  Your e-visit answers were reviewed by a board certified advanced clinical practitioner to complete your personal care plan. Depending upon the condition, your plan could have included both over the counter or prescription medications. Please review your pharmacy choice. If there is a problem, you may call our nursing hot line at and have the prescription routed to another pharmacy. Your safety is important to us. If you have drug allergies check your prescription carefully.   You can use MyChart to ask questions about today's visit, request a non-urgent call back, or ask for a work or school excuse for 24 hours related to this e-Visit. If it has been greater than 24 hours you will need to follow up with your provider, or enter a new e-Visit to address those concerns. You will get an e-mail in the next two days asking about your experience.  I hope that your e-visit has been valuable and will speed your recovery. Thank you for using e-visits.    Approximately 5  minutes was spent documenting and reviewing patient's chart.    

## 2020-09-08 DIAGNOSIS — Z30432 Encounter for removal of intrauterine contraceptive device: Secondary | ICD-10-CM | POA: Diagnosis not present

## 2020-09-08 DIAGNOSIS — Z6841 Body Mass Index (BMI) 40.0 and over, adult: Secondary | ICD-10-CM | POA: Diagnosis not present

## 2020-09-08 DIAGNOSIS — Z01419 Encounter for gynecological examination (general) (routine) without abnormal findings: Secondary | ICD-10-CM | POA: Diagnosis not present

## 2020-09-08 MED FILL — SERTRALINE HCL 100 MG TABS: 100 | 90 days supply | Qty: 180 | Fill #0

## 2020-09-08 MED FILL — METHYLPHENIDATE HCL ER 20 M: 20 | 90 days supply | Qty: 90 | Fill #0

## 2020-09-16 LAB — HM PAP SMEAR: HM Pap smear: NEGATIVE

## 2020-10-09 ENCOUNTER — Emergency Department (HOSPITAL_COMMUNITY): Payer: 59

## 2020-10-09 ENCOUNTER — Encounter (HOSPITAL_COMMUNITY): Payer: Self-pay | Admitting: Emergency Medicine

## 2020-10-09 ENCOUNTER — Emergency Department (HOSPITAL_COMMUNITY)
Admission: EM | Admit: 2020-10-09 | Discharge: 2020-10-09 | Disposition: A | Discharge status: Home / Self Care | Payer: 59 | Attending: Emergency Medicine | Admitting: Emergency Medicine

## 2020-10-09 DIAGNOSIS — R0602 Shortness of breath: Secondary | ICD-10-CM | POA: Diagnosis not present

## 2020-10-09 DIAGNOSIS — Z91010 Allergy to peanuts: Secondary | ICD-10-CM | POA: Diagnosis not present

## 2020-10-09 DIAGNOSIS — R42 Dizziness and giddiness: Secondary | ICD-10-CM | POA: Insufficient documentation

## 2020-10-09 DIAGNOSIS — M6281 Muscle weakness (generalized): Secondary | ICD-10-CM | POA: Insufficient documentation

## 2020-10-09 DIAGNOSIS — R079 Chest pain, unspecified: Secondary | ICD-10-CM | POA: Diagnosis not present

## 2020-10-09 DIAGNOSIS — R1111 Vomiting without nausea: Secondary | ICD-10-CM | POA: Diagnosis not present

## 2020-10-09 DIAGNOSIS — Z87891 Personal history of nicotine dependence: Secondary | ICD-10-CM | POA: Insufficient documentation

## 2020-10-09 DIAGNOSIS — R112 Nausea with vomiting, unspecified: Secondary | ICD-10-CM | POA: Insufficient documentation

## 2020-10-09 LAB — CBC
HCT: 43.5 % (ref 36.0–46.0)
Hemoglobin: 14.3 g/dL (ref 12.0–15.0)
MCH: 27.3 pg (ref 26.0–34.0)
MCHC: 32.9 g/dL (ref 30.0–36.0)
MCV: 83.2 fL (ref 80.0–100.0)
Platelets: 272 10*3/uL (ref 150–400)
RBC: 5.23 MIL/uL — ABNORMAL HIGH (ref 3.87–5.11)
RDW: 13.2 % (ref 11.5–15.5)
WBC: 15.3 10*3/uL — ABNORMAL HIGH (ref 4.0–10.5)
nRBC: 0 % (ref 0.0–0.2)

## 2020-10-09 LAB — COMPREHENSIVE METABOLIC PANEL
ALT: 17 U/L (ref 0–44)
AST: 18 U/L (ref 15–41)
Albumin: 3.8 g/dL (ref 3.5–5.0)
Alkaline Phosphatase: 74 U/L (ref 38–126)
Anion gap: 12 (ref 5–15)
BUN: 8 mg/dL (ref 6–20)
CO2: 19 mmol/L — ABNORMAL LOW (ref 22–32)
Calcium: 9.4 mg/dL (ref 8.9–10.3)
Chloride: 105 mmol/L (ref 98–111)
Creatinine, Ser: 0.68 mg/dL (ref 0.44–1.00)
GFR, Estimated: >60 mL/min (ref >60)
Glucose, Bld: 101 mg/dL — ABNORMAL HIGH (ref 70–99)
Potassium: 3.7 mmol/L (ref 3.5–5.1)
Sodium: 136 mmol/L (ref 135–145)
Total Bilirubin: 0.4 mg/dL (ref 0.3–1.2)
Total Protein: 7.3 g/dL (ref 6.5–8.1)

## 2020-10-09 LAB — I-STAT BETA HCG BLOOD, ED (MC, WL, AP ONLY): I-stat hCG, quantitative: <5 m[IU]/mL (ref <5)

## 2020-10-09 MED ORDER — AMOXICILLIN-POT CLAVULANATE 875-125 MG PO TABS: 1.0000 | ORAL_TABLET | Freq: Two times a day (BID) | ORAL | 0 refills | Status: AC

## 2020-10-09 MED ORDER — ONDANSETRON 4 MG PO TBDP
4.0000 mg | ORAL_TABLET | Freq: Once | ORAL | Status: AC
  Administered 2020-10-09: 4 mg via ORAL
  Filled 2020-10-09: qty 1

## 2020-10-09 MED ORDER — PROCHLORPERAZINE EDISYLATE 10 MG/2ML IJ SOLN
5.0000 mg | Freq: Once | INTRAMUSCULAR | Status: AC
  Administered 2020-10-09: 5 mg via INTRAVENOUS
  Filled 2020-10-09: qty 2

## 2020-10-09 MED ORDER — ONDANSETRON HCL 4 MG/2ML IJ SOLN
4.0000 mg | Freq: Once | INTRAMUSCULAR | Status: AC
  Administered 2020-10-09: 4 mg via INTRAVENOUS
  Filled 2020-10-09: qty 2

## 2020-10-09 MED ORDER — MECLIZINE HCL 25 MG PO TABS
25.0000 mg | ORAL_TABLET | Freq: Once | ORAL | Status: AC
  Administered 2020-10-09: 25 mg via ORAL
  Filled 2020-10-09: qty 1

## 2020-10-09 MED ORDER — DIPHENHYDRAMINE HCL 50 MG/ML IJ SOLN
12.5000 mg | Freq: Once | INTRAMUSCULAR | Status: AC
  Administered 2020-10-09: 12.5 mg via INTRAVENOUS
  Filled 2020-10-09: qty 1

## 2020-10-09 MED ORDER — MECLIZINE HCL 25 MG PO TABS: 25.0000 mg | ORAL_TABLET | Freq: Three times a day (TID) | ORAL | 0 refills | Status: DC | PRN

## 2020-10-09 MED ORDER — DIAZEPAM 5 MG PO TABS
5.0000 mg | ORAL_TABLET | Freq: Once | ORAL | Status: AC
  Administered 2020-10-09: 5 mg via ORAL
  Filled 2020-10-09: qty 1

## 2020-10-09 NOTE — ED Notes (Signed)
Pt transported to CT ?

## 2020-10-09 NOTE — Discharge Instructions (Signed)
Recommend following up with your primary care doctor, neurosurgeon and ear nose and throat doctor. Return to ER for new or worsening symptoms.

## 2020-10-09 NOTE — ED Notes (Signed)
Family at bedside. 

## 2020-10-09 NOTE — ED Provider Notes (Signed)
[facility] EMERGENCY DEPARTMENT Provider Note   CSN: [id number] Arrival date & time: 10/09/20  1033     History No chief complaint on file.   Emma Boyer is a 25 y.o. female.   Dizziness Quality:  Room spinning Severity:  Severe Onset quality:  Sudden Timing:  Constant Progression:  Partially resolved Chronicity:  New Relieved by:  Nothing Worsened by:  Nothing Ineffective treatments:  None tried Associated symptoms: nausea, vomiting and weakness (generalized)   Associated symptoms: no chest pain, no diarrhea, no headaches, no hearing loss, no palpitations, no shortness of breath, no syncope and no tinnitus        Past Medical History:  Diagnosis Date  . Bulging lumbar disc 2014  . HA (headache)   . Low back pain   . Neck pain   . Pneumonia   . Pseudopapilledema   . Shoulder pain   . Vision abnormalities    wears contacts, right eye worse    Patient Active Problem List   Diagnosis Date Noted  . H/O degenerative disc disease 02/07/2019  . 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 Hemoptysis 09/07/2016  . De Quervain's tenosynovitis, bilateral 11/21/2015  . History of migraine headaches 08/29/2015  . Abnormal weight gain 01/27/2015  . Migraine headache 01/27/2015  . Pseudopapilledema   . Shoulder pain   . 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  . Difficulty concentrating 09/08/2012    Past Surgical History:  Procedure Laterality Date  . None    . SUBOCCIPITAL CRANIECTOMY CERVICAL LAMINECTOMY N/A 05/01/2013   Procedure: Chiari Decompression;  Surgeon: Maeola Harman, MD;  Location: [facility];  Service: Neurosurgery;  Laterality: N/A;  Chiari Decompression     OB History   No obstetric history on file.     Family History  Problem Relation Age of Onset  . High blood pressure Father   . Arthritis Father   . Anxiety disorder Mother   . Depression Mother   . Anxiety disorder Sister   . Depression Sister    . Physical abuse Sister   . Sexual abuse Sister   . ADD / ADHD Maternal Aunt   . Anxiety disorder Maternal Aunt   . Depression Maternal Aunt   . Anxiety disorder Maternal Grandfather   . Dementia Maternal Grandfather   . Depression Maternal Grandfather     Social History   Tobacco Use  . Smoking status: Former Smoker    Packs/day: 0.10    Years: 7.00    Pack years: 0.70    Types: Cigarettes    Quit date: 03/18/2017    Years since quitting: 3.5  . Smokeless tobacco: Never Used  . Tobacco comment: Smokes about 2 per day.  Vaping Use  . Vaping Use: Former  Substance Use Topics  . Alcohol use: Yes    Alcohol/week: 1.0 standard drink    Types: 1 Glasses of wine per week    Comment: Occasionally  . Drug use: No    Home Medications Prior to Admission medications   Medication Sig Start Date End Date Taking? Authorizing Provider  albuterol (VENTOLIN HFA) 108 (90 Base) MCG/ACT inhaler Inhale 2 puffs into the lungs every 6 (six) hours as needed for wheezing or shortness of breath. 10/12/19   Agapito Games, MD  amoxicillin-clavulanate (AUGMENTIN) 875-125 MG tablet Take 1 tablet by mouth 2 (two) times daily. 12/07/19   Daphine Deutscher, Mary-Margaret, FNP  benzonatate (TESSALON PERLES) 100 MG capsule Take 1 capsule (100 mg total)  by mouth 3 (three) times daily as needed. 08/25/20   Junie Spencer, FNP  fluticasone (FLONASE) 50 MCG/ACT nasal spray Place 2 sprays into both nostrils daily. 08/25/20   Junie Spencer, FNP  tiZANidine (ZANAFLEX) 4 MG tablet Take 1 tablet (4 mg total) by mouth every 6 (six) hours as needed for muscle spasms (or headaches). 05/09/20   Everrett Coombe, DO    Allergies    Peanut-containing drug products  Review of Systems   Review of Systems  Constitutional: Negative for chills and fever.  HENT: Negative for congestion, hearing loss, rhinorrhea and tinnitus.   Respiratory: Negative for cough and shortness of breath.   Cardiovascular: Negative for chest pain,  palpitations and syncope.  Gastrointestinal: Positive for nausea and vomiting. Negative for diarrhea.  Genitourinary: Negative for difficulty urinating and dysuria.  Musculoskeletal: Negative for arthralgias and back pain.  Skin: Negative for rash and wound.  Neurological: Positive for dizziness and weakness (generalized). Negative for light-headedness and headaches.    Physical Exam Updated Vital Signs BP 136/72   Pulse 78   Temp 98.1 F (36.7 C) (Oral)   Resp (!) 22   SpO2 97%   Physical Exam Vitals and nursing note reviewed. Exam conducted with a chaperone present.  Constitutional:      General: She is not in acute distress.    Appearance: Normal appearance.  HENT:     Head: Normocephalic and atraumatic.     Nose: No rhinorrhea.  Eyes:     General:        Right eye: No discharge.        Left eye: No discharge.     Conjunctiva/sclera: Conjunctivae normal.  Cardiovascular:     Rate and Rhythm: Normal rate and regular rhythm.  Pulmonary:     Effort: Pulmonary effort is normal. No respiratory distress.     Breath sounds: No stridor.  Abdominal:     General: Abdomen is flat. There is no distension.     Palpations: Abdomen is soft.  Musculoskeletal:        General: No tenderness or signs of injury.  Skin:    General: Skin is warm and dry.  Neurological:     General: No focal deficit present.     Mental Status: She is alert. Mental status is at baseline.     Motor: No weakness.     Comments: 5 out of 5 motor strength in all extremities, sensation intact throughout, no dysmetria, no dysdiadochokinesia, no ataxia with ambulation, cranial nerves II through XII intact, alert and oriented to person place and time, with rapid head movement, Patient's rapid head impulse test is normal.  Test of skew is normal  Psychiatric:        Mood and Affect: Mood normal.        Behavior: Behavior normal.     ED Results / Procedures / Treatments   Labs (all labs ordered are listed, but  only abnormal results are displayed) Labs Reviewed  CBC - Abnormal; Notable for the following components:      Result Value   WBC 15.3 (*)    RBC 5.23 (*)    All other components within normal limits  COMPREHENSIVE METABOLIC PANEL - Abnormal; Notable for the following components:   CO2 19 (*)    Glucose, Bld 101 (*)    All other components within normal limits  I-STAT BETA HCG BLOOD, ED ([facility], [facility], [facility] ONLY)    EKG EKG Interpretation  Date/Time:  Thursday October 09 2020 12:14:54 EST Ventricular Rate:  79 PR Interval:    QRS Duration: 93 QT Interval:  392 QTC Calculation: 450 R Axis:   65 Text Interpretation: Sinus rhythm Confirmed by Cherlynn Perches ([id number]) on 10/09/2020 12:19:21 PM   Radiology DG Chest 2 View  Result Date: 10/09/2020 CLINICAL DATA:  Chest pain and shortness of breath.  Vertigo. EXAM: CHEST - 2 VIEW COMPARISON:  05/23/2017 FINDINGS: The heart size and mediastinal contours are within normal limits. Both lungs are clear. The visualized skeletal structures are unremarkable. IMPRESSION: No active cardiopulmonary disease. Electronically Signed   By: Danae Orleans M.D.   On: 10/09/2020 13:18   CT Head Wo Contrast  Result Date: 10/09/2020 CLINICAL DATA:  Dizziness, nonspecific; history of Chiari malformation and new onset vertigo. Additional history provided: Patient reports dizziness upon waking this morning. EXAM: CT HEAD WITHOUT CONTRAST TECHNIQUE: Contiguous axial images were obtained from the base of the skull through the vertex without intravenous contrast. COMPARISON:  Brain MRI 04/07/2013.  Head CT 08/03/2002. FINDINGS: Brain: Sequela of prior posterior fossa decompression, presumably for the previously demonstrated Chiari I malformation. Cerebral volume is normal. There is no acute intracranial hemorrhage. No demarcated cortical infarct. No extra-axial fluid collection. No evidence of intracranial mass. No midline shift. Partially empty sella turcica. Vascular: No  hyperdense vessel. Skull: Normal. Negative for fracture or focal lesion. Sinuses/Orbits: Visualized orbits show no acute finding. Frothy secretions within the right sphenoid sinus. IMPRESSION: No evidence of acute intracranial abnormality. Sequela of prior posterior fossa decompression, presumably for the previously demonstrated Chiari I malformation. Partially empty sella turcica. This finding is very commonly incidental, but can be associated with idiopathic intracranial hypertension. Right sphenoid sinusitis. Electronically Signed   By: Jackey Loge DO   On: 10/09/2020 12:56    Procedures Procedures   Medications Ordered in ED Medications  meclizine (ANTIVERT) tablet 25 mg (25 mg Oral Given 10/09/20 1303)  ondansetron (ZOFRAN-ODT) disintegrating tablet 4 mg (4 mg Oral Given 10/09/20 1303)  diazepam (VALIUM) tablet 5 mg (5 mg Oral Given 10/09/20 1349)    ED Course  I have reviewed the triage vital signs and the nursing notes.  Pertinent labs & imaging results that were available during my care of the patient were reviewed by me and considered in my medical decision making (see chart for details).    MDM Rules/Calculators/A&P                          Sudden onset vertigo, resolving, will give symptom control.  Will get basic labs we will get EKG chest x-ray she commented during the vomiting episodes she had some chest pressure.  The cardiac and pulmonary exams are unremarkable.  Normal neurologic exam now.  Likely benign positional peripheral vertigo.  Family is concerned because she has a history of Chiari malformation and had neurosurgery several years ago that she needs imaging.  Feels low likelihood of finding anything abnormal.  However will get a CT scan without contrast to make sure there are no significant derangements.  CT imaging after review of my findings shows may be signs of IH however patient has no visual changes and no significant headache.  Patient still feels dizzy.   Attempted Dix-Hallpike and Epley maneuver with no success.  Patient is given Valium with mild relief of symptoms.  Laboratory studies fairly unremarkable after my review.  Will get MRI to evaluate central versus peripheral.  If negative will be  discharged home with outpatient neurology follow-up.  If positive will seek further specialty consultation.  Pt care was handed off to on coming provider at 2300.  Complete history and physical and current plan have been communicated.  Please refer to their note for the remainder of ED care and ultimate disposition.  Pt seen in conjunction with Dr. Stevie Kernykstra   Final Clinical Impression(s) / ED Diagnoses Final diagnoses:  Dizziness    Rx / DC Orders ED Discharge Orders    None       Sabino DonovanKatz, Charlynn Salih C, MD 10/09/20 1444

## 2020-10-09 NOTE — ED Triage Notes (Signed)
Pt here with vertigo type symptoms , never been dx but thinks that is what she has

## 2020-10-11 ENCOUNTER — Telehealth: Payer: 59 | Admitting: Nurse Practitioner

## 2020-10-11 DIAGNOSIS — R112 Nausea with vomiting, unspecified: Secondary | ICD-10-CM

## 2020-10-11 MED ORDER — ONDANSETRON HCL 4 MG PO TABS: 4.0000 mg | ORAL_TABLET | Freq: Three times a day (TID) | ORAL | 0 refills | Status: DC | PRN

## 2020-10-11 NOTE — Progress Notes (Signed)
We are sorry that you are not feeling well. Here is how we plan to help!  * I will say stopping zoloft cold [place] like that can make you feel terrible. You should always wean off those kind of meds.  Based on what you have shared with me it looks like you have a Virus that is irritating your GI tract.  Vomiting is the forceful emptying of a portion of the stomach's content through the mouth.  Although nausea and vomiting can make you feel miserable, it's important to remember that these are not diseases, but rather symptoms of an underlying illness.  When we treat short term symptoms, we always caution that any symptoms that persist should be fully evaluated in a medical office.  I have prescribed a medication that will help alleviate your symptoms and allow you to stay hydrated:  Zofran 4 mg 1 tablet every 8 hours as needed for nausea and vomiting  HOME CARE:  Drink clear liquids.  This is very important! Dehydration (the lack of fluid) can lead to a serious complication.  Start off with 1 tablespoon every 5 minutes for 8 hours.  You may begin eating bland foods after 8 hours without vomiting.  Start with saltine crackers, white bread, rice, mashed potatoes, applesauce.  After 48 hours on a bland diet, you may resume a normal diet.  Try to go to sleep.  Sleep often empties the stomach and relieves the need to vomit.  GET HELP RIGHT AWAY IF:   Your symptoms do not improve or worsen within 2 days after treatment.  You have a fever for over 3 days.  You cannot keep down fluids after trying the medication.  MAKE SURE YOU:   Understand these instructions.  Will watch your condition.  Will get help right away if you are not doing well or get worse.   Thank you for choosing an e-visit. Your e-visit answers were reviewed by a board certified advanced clinical practitioner to complete your personal care plan. Depending upon the condition, your plan could have included both over the  counter or prescription medications. Please review your pharmacy choice. Be sure that the pharmacy you have chosen is open so that you can pick up your prescription now.  If there is a problem you may message your provider in MyChart to have the prescription routed to another pharmacy. Your safety is important to [place]. If you have drug allergies check your prescription carefully.  For the next 24 hours, you can use MyChart to ask questions about today's visit, request a non-urgent call back, or ask for a work or school excuse from your e-visit provider. You will get an e-mail in the next two days asking about your experience. I hope that your e-visit has been valuable and will speed your recovery.  5-10 minutes spent reviewing and documenting in chart.

## 2020-10-13 ENCOUNTER — Other Ambulatory Visit (HOSPITAL_COMMUNITY): Payer: Self-pay | Admitting: Physician Assistant

## 2020-10-13 ENCOUNTER — Telehealth: Payer: Self-pay | Admitting: Sports Medicine

## 2020-10-13 ENCOUNTER — Encounter: Payer: Self-pay | Admitting: Physician Assistant

## 2020-10-13 ENCOUNTER — Ambulatory Visit: Payer: 59 | Admitting: Physician Assistant

## 2020-10-13 ENCOUNTER — Other Ambulatory Visit: Payer: Self-pay

## 2020-10-13 VITALS — Ht 67.0 in | Wt 335.0 lb

## 2020-10-13 DIAGNOSIS — F419 Anxiety disorder, unspecified: Secondary | ICD-10-CM | POA: Diagnosis not present

## 2020-10-13 DIAGNOSIS — J0130 Acute sphenoidal sinusitis, unspecified: Secondary | ICD-10-CM

## 2020-10-13 DIAGNOSIS — H8111 Benign paroxysmal vertigo, right ear: Secondary | ICD-10-CM | POA: Diagnosis not present

## 2020-10-13 DIAGNOSIS — G935 Compression of brain: Secondary | ICD-10-CM | POA: Diagnosis not present

## 2020-10-13 DIAGNOSIS — G4700 Insomnia, unspecified: Secondary | ICD-10-CM | POA: Insufficient documentation

## 2020-10-13 DIAGNOSIS — Z8669 Personal history of other diseases of the nervous system and sense organs: Secondary | ICD-10-CM | POA: Diagnosis not present

## 2020-10-13 DIAGNOSIS — H6591 Unspecified nonsuppurative otitis media, right ear: Secondary | ICD-10-CM

## 2020-10-13 DIAGNOSIS — G479 Sleep disorder, unspecified: Secondary | ICD-10-CM | POA: Diagnosis not present

## 2020-10-13 DIAGNOSIS — E236 Other disorders of pituitary gland: Secondary | ICD-10-CM | POA: Diagnosis not present

## 2020-10-13 MED ORDER — METHYLPREDNISOLONE 4 MG PO TBPK: ORAL_TABLET | ORAL | 0 refills | Status: DC

## 2020-10-13 MED ORDER — DIAZEPAM 2 MG PO TABS: 2.0000 mg | ORAL_TABLET | Freq: Two times a day (BID) | ORAL | 0 refills | Status: DC | PRN

## 2020-10-13 MED FILL — diazePAM 2 MG TABS: 2 | 15 days supply | Qty: 30 | Fill #0

## 2020-10-13 MED FILL — methylPREDNISolone 4 MG dos: 4 | 6 days supply | Qty: 21 | Fill #0

## 2020-10-13 NOTE — Progress Notes (Signed)
Subjective:    Patient ID: Emma Boyer, female    DOB: 08-Mar-1996, 25 y.o.   MRN: [id number]  HPI  Patient is a 25 year old obese female with history of migraines, chiari malformation type I who presents to the clinic to follow-up after ED visit for vertigo on 10/09/2020.  Physical exam as below:   Comments: 5 out of 5 motor strength in all extremities, sensation intact throughout, no dysmetria, no dysdiadochokinesia, no ataxia with ambulation, cranial nerves II through XII intact, alert and oriented to person place and time, with rapid head movement, Patient's rapid head impulse test is normal.  Test of skew is normal   Patient did have a normal EKG and chest x-ray.    CT of brain showed:  IMPRESSION: No evidence of acute intracranial abnormality.  Sequela of prior posterior fossa decompression, presumably for the previously demonstrated Chiari I malformation.  Partially empty sella turcica. This finding is very commonly incidental, but can be associated with idiopathic intracranial hypertension.  Right sphenoid sinusitis.  MRI of brain showed:  IMPRESSION: No acute intracranial process.  Active right sphenoid sinus inflammation.  She was given valium, antivert and augmentin for sphenoid sinusitis in ED.   She continued to have nausea and vomiting and had an E-visit and given zofran.   All of her dizziness is her right side and when she turns her head to the right. Dizziness is "room spinning" no more vomiting. antivert does help. Started flonase yesterday and helped as well. She continues to have problems sleeping and very anxious. Request valium. No fever, chills, body aches, recent history of URI in Jan 2022 but negative for covid. No vision changes or headaches. No known trauma.   Pt and mother concerned due to chiari malformation and surgery back in 2014. Seen by Maeola Harman, neurosurgeon.  .       .. Active Ambulatory Problems    Diagnosis Date Noted    Difficulty concentrating 09/08/2012   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   Pseudopapilledema    Shoulder pain    Abnormal weight gain 01/27/2015   Migraine headache 01/27/2015   History of migraine headaches 08/29/2015   De Quervain's tenosynovitis, bilateral 11/21/2015   Hemoptysis 09/07/2016   Temporomandibular joint arthralgia, right 04/25/2017   H/O degenerative disc disease 02/07/2019   Acute suppurative inflammation of sphenoidal sinus 10/13/2020   Empty sella turcica (HCC) 10/13/2020   Benign paroxysmal positional vertigo, right 10/13/2020   Trouble in sleeping 10/13/2020   Anxiety 10/13/2020   Resolved Ambulatory Problems    Diagnosis Date Noted   OTITIS MEDIA, SEROUS, ACUTE, LEFT 05/18/2009   Acute pharyngitis 04/28/2010   URI 05/18/2009   Preventive measure 09/08/2012   Low back pain 04/20/2013   Cerumen impaction 01/14/2014   Influenza A 09/23/2015   Viral syndrome 10/06/2018   Past Medical History:  Diagnosis Date   Bulging lumbar disc 2014   HA (headache)    Neck pain    Pneumonia    Vision abnormalities       Review of Systems See HPI.     Objective:   Physical Exam Vitals reviewed.  Constitutional:      Appearance: Normal appearance. She is obese.  HENT:     Head: Normocephalic.     Left Ear: Tympanic membrane normal.     Ears:     Comments: Serous fluid from 6oclock to 12 oclock, right TM.    Mouth/Throat:     Mouth: Mucous membranes are  moist.  Eyes:     Extraocular Movements: Extraocular movements intact.     Pupils: Pupils are equal, round, and reactive to light.  Cardiovascular:     Rate and Rhythm: Normal rate and regular rhythm.     Pulses: Normal pulses.     Heart sounds: Normal heart sounds.  Pulmonary:     Effort: Pulmonary effort is normal.     Breath sounds: Normal breath sounds.  Lymphadenopathy:     Cervical: No cervical adenopathy.  Neurological:     General: No focal deficit  present.     Mental Status: She is alert and oriented to person, place, and time.     Cranial Nerves: No cranial nerve deficit.     Sensory: No sensory deficit.     Motor: No weakness.     Coordination: Coordination normal.     Gait: Gait normal.     Deep Tendon Reflexes: Reflexes normal.     Comments: Pt reports dizziness with dix hallpike to the right and when sitting up but no nystagmus noted.   Psychiatric:        Mood and Affect: Mood normal.           Assessment & Plan:  .Carmyn was seen today for dizziness.  Diagnoses and all orders for this visit:  Benign paroxysmal positional vertigo, right -     methylPREDNISolone (MEDROL DOSEPAK) 4 MG TBPK tablet; Take as directed by package insert. -     Ambulatory referral to Neurosurgery -     Ambulatory referral to Physical Therapy  Acute suppurative inflammation of sphenoidal sinus -     methylPREDNISolone (MEDROL DOSEPAK) 4 MG TBPK tablet; Take as directed by package insert.  Chiari malformation type I (HCC)  Empty sella turcica (HCC) -     Ambulatory referral to Neurosurgery  Anxiety -     diazepam (VALIUM) 2 MG tablet; Take 1 tablet (2 mg total) by mouth every 12 (twelve) hours as needed for anxiety.  Trouble in sleeping -     diazepam (VALIUM) 2 MG tablet; Take 1 tablet (2 mg total) by mouth every 12 (twelve) hours as needed for anxiety.  Fluid level behind tympanic membrane of right ear -     methylPREDNISolone (MEDROL DOSEPAK) 4 MG TBPK tablet; Take as directed by package insert.  History of Chiari malformation -     Ambulatory referral to Neurosurgery   Unclear etiology for vertigo.  I did not get a classic positive Dix-Hallpike on exam today.  However she does describe room spinning sensation which is more classic of vertigo.  Patient is very concerned and this is been going on since 2/24.  I did make a referral for vestibular rehab.  I did see in chart that she had a URI on 08/25/2020.  Looks like she was Covid  negative however this could be some correlation to her new symptoms.  She has been treated for sinus infection with Augmentin.  I did add a Medrol Dosepak since I did see some fluid behind her right ear and history of sphenoid sinusitis.  Continue with Flonase.  Continue with Antivert as needed.  Patient does request some Valium to help her sleep at night since that helped in the ED.  We will give a small quantity lower still figuring out what is going on.  I gave her handout for vertigo as well as Epley maneuvers.  Showed her how to do these maneuvers.  Reassured that orthostatic blood pressures were  not indicative of any hypotension. Keep doing epley manuevers, antivert, flonase, finish augmentin, start medrol dose pack. Will make referral due to partial empty sella turcica. This reading was not on 2014 MRI. Pt requested to skip neurology due to bad experience. Reassurance given. Follow up as needed or If symptoms worsening. Written out of work for next week to rest and treat vertigo. Not able to drive.  Discussed she is going to need to be able to turn her head to drive.   4 week follow up.   Spent 40 minutes with patient reviewing chart, discussing causes and treatment plan and coordinating care.  Some orthostatic changes but does not meet criteria.

## 2020-10-13 NOTE — Telephone Encounter (Signed)
Ok for me

## 2020-10-13 NOTE — Patient Instructions (Signed)

## 2020-10-13 NOTE — Telephone Encounter (Signed)
Totally understand.

## 2020-10-13 NOTE — Telephone Encounter (Signed)
Pt states she likes her PCP Dr.Thekkekandam but with her being a young female, she feels more comfortable seeing Emma Boyer as her PCP. Can she switch her pcp from DrT to [name]?

## 2020-10-14 ENCOUNTER — Other Ambulatory Visit: Payer: Self-pay | Admitting: Physician Assistant

## 2020-10-14 DIAGNOSIS — H8111 Benign paroxysmal vertigo, right ear: Secondary | ICD-10-CM

## 2020-10-14 NOTE — Progress Notes (Signed)
Referral

## 2020-10-15 DIAGNOSIS — H8110 Benign paroxysmal vertigo, unspecified ear: Secondary | ICD-10-CM | POA: Diagnosis not present

## 2020-11-16 ENCOUNTER — Telehealth: Payer: 59 | Admitting: Nurse Practitioner

## 2020-11-16 DIAGNOSIS — R059 Cough, unspecified: Secondary | ICD-10-CM | POA: Diagnosis not present

## 2020-11-16 MED ORDER — PSEUDOEPH-BROMPHEN-DM 30-2-10 MG/5ML PO SYRP: 5.0000 mL | ORAL_SOLUTION | Freq: Four times a day (QID) | ORAL | 0 refills | Status: AC | PRN

## 2020-11-16 NOTE — Progress Notes (Signed)
We are sorry that you are not feeling well.  Here is how we plan to help!  Based on your presentation I believe you most likely have A cough due to a virus.  This is called viral bronchitis and is best treated by rest, plenty of fluids and control of the cough.  You may use Ibuprofen or Tylenol as directed to help your symptoms.     In addition you may use Bromfed DM. Take 19mL every 6 hours as needed for cough.  From your responses in the eVisit questionnaire you describe inflammation in the upper respiratory tract which is causing a significant cough.  This is commonly called Bronchitis and has four common causes:    Allergies  Viral Infections  Acid Reflux  Bacterial Infection Allergies, viruses and acid reflux are treated by controlling symptoms or eliminating the cause. An example might be a cough caused by taking certain blood pressure medications. You stop the cough by changing the medication. Another example might be a cough caused by acid reflux. Controlling the reflux helps control the cough.  USE OF BRONCHODILATOR ("RESCUE") INHALERS: There is a risk from using your bronchodilator too frequently.  The risk is that over-reliance on a medication which only relaxes the muscles surrounding the breathing tubes can reduce the effectiveness of medications prescribed to reduce swelling and congestion of the tubes themselves.  Although you feel brief relief from the bronchodilator inhaler, your asthma may actually be worsening with the tubes becoming more swollen and filled with mucus.  This can delay other crucial treatments, such as oral steroid medications. If you need to use a bronchodilator inhaler daily, several times per day, you should discuss this with your provider.  There are probably better treatments that could be used to keep your asthma under control.     HOME CARE . Only take medications as instructed by your medical team. . Complete the entire course of an antibiotic. . Drink  plenty of fluids and get plenty of rest. . Avoid close contacts especially the very young and the elderly . Cover your mouth if you cough or cough into your sleeve. . Always remember to wash your hands . A steam or ultrasonic humidifier can help congestion.   GET HELP RIGHT AWAY IF: . You develop worsening fever. . You become short of breath . You cough up blood. . Your symptoms persist after you have completed your treatment plan MAKE SURE YOU   Understand these instructions.  Will watch your condition.  Will get help right away if you are not doing well or get worse.  Your e-visit answers were reviewed by a board certified advanced clinical practitioner to complete your personal care plan.  Depending on the condition, your plan could have included both over the counter or prescription medications. If there is a problem please reply  once you have received a response from your provider. Your safety is important to [place].  If you have drug allergies check your prescription carefully.    You can use MyChart to ask questions about today's visit, request a non-urgent call back, or ask for a work or school excuse for 24 hours related to this e-Visit. If it has been greater than 24 hours you will need to follow up with your provider, or enter a new e-Visit to address those concerns. You will get an e-mail in the next two days asking about your experience.  I hope that your e-visit has been valuable and will speed your  recovery. Thank you for using e-visits.  I have spent at least 5 minutes reviewing and documenting in the patient's chart.

## 2020-12-03 ENCOUNTER — Other Ambulatory Visit (HOSPITAL_BASED_OUTPATIENT_CLINIC_OR_DEPARTMENT_OTHER): Payer: Self-pay

## 2021-01-07 ENCOUNTER — Encounter: Payer: Self-pay | Admitting: Physician Assistant

## 2021-01-08 ENCOUNTER — Other Ambulatory Visit (HOSPITAL_COMMUNITY): Payer: Self-pay

## 2021-01-08 ENCOUNTER — Other Ambulatory Visit (HOSPITAL_BASED_OUTPATIENT_CLINIC_OR_DEPARTMENT_OTHER): Payer: Self-pay

## 2021-01-08 MED ORDER — METHYLPHENIDATE HCL ER 20 MG PO TBCR: 20.0000 mg | EXTENDED_RELEASE_TABLET | Freq: Every morning | ORAL | 0 refills | Status: DC

## 2021-01-08 MED ORDER — SERTRALINE HCL 100 MG PO TABS
200.0000 mg | ORAL_TABLET | Freq: Every morning | ORAL | 1 refills | Status: DC
  Filled 2021-01-08: qty 60, 30d supply, fill #0
  Filled 2021-03-12: qty 60, 30d supply, fill #1

## 2021-01-08 MED ORDER — SERTRALINE HCL 100 MG PO TABS: 200.0000 mg | ORAL_TABLET | Freq: Every morning | ORAL | 1 refills | Status: DC

## 2021-01-08 NOTE — Addendum Note (Signed)
Addended bySilvio Pate on: 01/08/2021 02:12 PM   Modules accepted: Orders

## 2021-01-08 NOTE — Telephone Encounter (Signed)
Refills pended.

## 2021-01-08 NOTE — Telephone Encounter (Signed)
Patient called back and left vm asking for medication to be sent to [facility], not [facility]. Sent Zoloft. Please sign RX. LMOM cancelling RX at [facility].

## 2021-01-08 NOTE — Telephone Encounter (Signed)
She will need to have regular follow ups for refills. I will send the first month.

## 2021-01-09 ENCOUNTER — Other Ambulatory Visit (HOSPITAL_BASED_OUTPATIENT_CLINIC_OR_DEPARTMENT_OTHER): Payer: Self-pay

## 2021-01-09 MED ORDER — METHYLPHENIDATE HCL ER 20 MG PO TBCR
EXTENDED_RELEASE_TABLET | Freq: Every day | ORAL | 0 refills | Status: DC
  Filled 2021-01-09: qty 90, 90d supply, fill #0

## 2021-01-13 ENCOUNTER — Other Ambulatory Visit (HOSPITAL_BASED_OUTPATIENT_CLINIC_OR_DEPARTMENT_OTHER): Payer: Self-pay

## 2021-03-13 ENCOUNTER — Other Ambulatory Visit (HOSPITAL_BASED_OUTPATIENT_CLINIC_OR_DEPARTMENT_OTHER): Payer: Self-pay

## 2021-03-26 ENCOUNTER — Encounter: Payer: Self-pay | Admitting: Physician Assistant

## 2021-03-26 ENCOUNTER — Telehealth: Payer: 59 | Admitting: Emergency Medicine

## 2021-03-26 DIAGNOSIS — R112 Nausea with vomiting, unspecified: Secondary | ICD-10-CM

## 2021-03-26 NOTE — Progress Notes (Signed)
Based on what you shared with me, I feel your condition warrants further evaluation and I recommend that you be seen in a face to face visit.  It's unusual that you'd still having nausea and vomiting for 3 days if this were a simple GI bug.  You may need to have some blood work.  I recommend that you be seen in person.   NOTE: There will be NO CHARGE for this eVisit   If you are having a true medical emergency please call 911.      For an urgent face to face visit, [facility] has six urgent care centers for your convenience:     [facility] at [facility] Directions [phone] [address] [place], [place] [address]    [facility]) Get Driving Directions [phone] [address] [place], [place] [address]  [facility]) Get Driving Directions [phone] [address] [place],  [place]  [address]  [facility] at [facility] Get Driving Directions [phone] [address] [place], [place] [address]   [facility] at [facility] Get Driving Directions  [phone] [address] [place], [place] [address]   [facility] at [facility] Directions [phone] [address] [place], [place] [address]  Your MyChart E-visit questionnaire answers were reviewed by a board certified advanced clinical practitioner to complete your personal care plan based on your specific symptoms.  Thank you for using e-Visits.   Approximately 5 minutes was used in reviewing the patient's chart, questionnaire, prescribing medications, and documentation.

## 2021-04-15 ENCOUNTER — Other Ambulatory Visit (HOSPITAL_BASED_OUTPATIENT_CLINIC_OR_DEPARTMENT_OTHER): Payer: Self-pay

## 2021-04-15 ENCOUNTER — Ambulatory Visit: Payer: 59 | Admitting: Physician Assistant

## 2021-04-15 ENCOUNTER — Other Ambulatory Visit: Payer: Self-pay

## 2021-04-15 VITALS — BP 138/86 | HR 83 | Ht 67.0 in | Wt 350.0 lb

## 2021-04-15 DIAGNOSIS — G4700 Insomnia, unspecified: Secondary | ICD-10-CM | POA: Diagnosis not present

## 2021-04-15 DIAGNOSIS — Z6841 Body Mass Index (BMI) 40.0 and over, adult: Secondary | ICD-10-CM

## 2021-04-15 DIAGNOSIS — K219 Gastro-esophageal reflux disease without esophagitis: Secondary | ICD-10-CM | POA: Insufficient documentation

## 2021-04-15 DIAGNOSIS — F419 Anxiety disorder, unspecified: Secondary | ICD-10-CM

## 2021-04-15 DIAGNOSIS — R1110 Vomiting, unspecified: Secondary | ICD-10-CM | POA: Insufficient documentation

## 2021-04-15 DIAGNOSIS — R112 Nausea with vomiting, unspecified: Secondary | ICD-10-CM | POA: Diagnosis not present

## 2021-04-15 LAB — POCT URINE PREGNANCY: Preg Test, Ur: NEGATIVE

## 2021-04-15 MED ORDER — SERTRALINE HCL 100 MG PO TABS
200.0000 mg | ORAL_TABLET | Freq: Every morning | ORAL | 1 refills | Status: DC
  Filled 2021-04-15: qty 180, 90d supply, fill #0
  Filled 2021-07-31: qty 180, 90d supply, fill #1

## 2021-04-15 MED ORDER — ONDANSETRON HCL 4 MG PO TABS
4.0000 mg | ORAL_TABLET | Freq: Three times a day (TID) | ORAL | 1 refills | Status: DC | PRN
  Filled 2021-04-15: qty 20, 7d supply, fill #0

## 2021-04-15 MED ORDER — OMEPRAZOLE 40 MG PO CPDR
40.0000 mg | DELAYED_RELEASE_CAPSULE | Freq: Every day | ORAL | 3 refills | Status: DC
Start: 2021-04-15 — End: 2021-09-16
  Filled 2021-04-15: qty 30, 30d supply, fill #0
  Filled 2021-05-13: qty 30, 30d supply, fill #1
  Filled 2021-06-15: qty 30, 30d supply, fill #2
  Filled 2021-08-11: qty 30, 30d supply, fill #3

## 2021-04-15 MED ORDER — TRAZODONE HCL 50 MG PO TABS
25.0000 mg | ORAL_TABLET | Freq: Every evening | ORAL | 2 refills | Status: DC | PRN
  Filled 2021-04-15: qty 60, 30d supply, fill #0
  Filled 2021-06-09: qty 60, 30d supply, fill #1
  Filled 2021-08-30: qty 60, 30d supply, fill #2

## 2021-04-15 NOTE — Progress Notes (Signed)
Subjective:    Patient ID: Emma Boyer, female    DOB: 11/05/1995, 25 y.o.   MRN: [id number]  HPI Patient is a 25 year old morbidly obese female with anxiety who presents to the clinic with nausea and vomiting for the last month.  Nausea is all throughout the day.  At times it is accompanied by headache.  Zofran does help.  She denies any constipation or diarrhea.  She does have 3 good bowel movements a day.  She denies any new medication.  She denies any abdominal pain.  She does report some increase in reflux.  She denies any fever, chills, body aches.  She does not think she is pregnant because she is struggled getting pregnant in the past.  She does not have her IUD or taking birth control. She is having periods but irregular.    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   Pseudopapilledema    Shoulder pain    Abnormal weight gain 01/27/2015   Migraine headache 01/27/2015   History of migraine headaches 08/29/2015   De Quervain's tenosynovitis, bilateral 11/21/2015   Hemoptysis 09/07/2016   Temporomandibular joint arthralgia, right 04/25/2017   H/O degenerative disc disease 02/07/2019   Acute suppurative inflammation of sphenoidal sinus 10/13/2020   Empty sella turcica (HCC) 10/13/2020   Benign paroxysmal positional vertigo, right 10/13/2020   Insomnia 10/13/2020   Anxiety 10/13/2020   Non-intractable vomiting 04/15/2021   Class 3 severe obesity due to excess calories without serious comorbidity with body mass index (BMI) of 50.0 to 59.9 in adult [facility]) 04/15/2021   Resolved Ambulatory Problems    Diagnosis Date Noted   OTITIS MEDIA, SEROUS, ACUTE, LEFT 05/18/2009   Acute pharyngitis 04/28/2010   URI 05/18/2009   Preventive measure 09/08/2012   Low back pain 04/20/2013   Cerumen impaction 01/14/2014   Influenza A 09/23/2015   Viral syndrome 10/06/2018   Past Medical History:   Diagnosis Date   Bulging lumbar disc 2014   HA (headache)    Neck pain    Pneumonia    Vision abnormalities        Review of Systems    See HPI.  Objective:   Physical Exam Vitals reviewed.  Constitutional:      Appearance: Normal appearance. She is obese.  HENT:     Head: Normocephalic.  Neck:     Vascular: No carotid bruit.  Cardiovascular:     Rate and Rhythm: Regular rhythm.     Pulses: Normal pulses.     Heart sounds: Normal heart sounds.  Pulmonary:     Effort: Pulmonary effort is normal.     Breath sounds: Normal breath sounds.  Abdominal:     General: Bowel sounds are normal. There is no distension.     Palpations: Abdomen is soft. There is no mass.     Tenderness: There is no abdominal tenderness. There is no right CVA tenderness, left CVA tenderness, guarding or rebound.  Musculoskeletal:     Cervical back: Normal range of motion and neck supple. No rigidity or tenderness.     Right lower leg: No edema.     Left lower leg: No edema.  Lymphadenopathy:     Cervical: No cervical adenopathy.  Neurological:     General: No focal deficit present.     Mental Status: She is alert and oriented to person, place, and time.  Psychiatric:  Mood and Affect: Mood normal.      .. Depression screen [facility] 2/9 04/15/2021 09/06/2019 04/25/2017  Decreased Interest 1 0 0  Down, Depressed, Hopeless 0 0 0  PHQ - 2 Score 1 0 0  Altered sleeping 3 1 -  Tired, decreased energy 3 1 -  Change in appetite 1 0 -  Feeling bad or failure about yourself  0 0 -  Trouble concentrating 1 0 -  Moving slowly or fidgety/restless 0 0 -  Suicidal thoughts 0 0 -  PHQ-9 Score 9 2 -  Difficult doing work/chores Somewhat difficult Not difficult at all -   .. GAD 7 : Generalized Anxiety Score 04/15/2021 09/06/2019  Nervous, Anxious, on Edge 1 0  Control/stop worrying 0 0  Worry too much - different things 1 0  Trouble relaxing 1 0  Restless 0 0  Easily annoyed or irritable 0 0   Afraid - awful might happen 0 0  Total GAD 7 Score 3 0  Anxiety Difficulty Somewhat difficult Not difficult at all        Assessment & Plan:  Marland KitchenMarland KitchenInes was seen today for nausea.  Diagnoses and all orders for this visit:  Non-intractable vomiting with nausea, unspecified vomiting type -     TSH -     ondansetron (ZOFRAN) 4 MG tablet; Take 1 tablet (4 mg total) by mouth every 8 (eight) hours as needed for nausea or vomiting. -     CBC w/Diff/Platelet -     COMPLETE METABOLIC PANEL WITH GFR -     Hemoglobin A1c -     B-HCG Quant -     omeprazole (PRILOSEC) 40 MG capsule; Take 1 capsule (40 mg total) by mouth daily. -     POCT urine pregnancy  Class 3 severe obesity due to excess calories without serious comorbidity with body mass index (BMI) of 50.0 to 59.9 in adult (HCC) -     TSH -     CBC w/Diff/Platelet -     COMPLETE METABOLIC PANEL WITH GFR -     Hemoglobin A1c  Insomnia, unspecified type -     traZODone (DESYREL) 50 MG tablet; Take 1/2-2 tablets (25-100 mg total) by mouth at bedtime as needed for sleep.  Anxiety -     sertraline (ZOLOFT) 100 MG tablet; Take 2 tablets (200 mg total) by mouth in the morning.  Gastroesophageal reflux disease, unspecified whether esophagitis present -     omeprazole (PRILOSEC) 40 MG capsule; Take 1 capsule (40 mg total) by mouth daily.  IUD removed. UPT negative. Will confirm with serum HCG.  Will get labs to look for any causes of nausea and vomiting.  Suspect some reflex going on. Start omeprazole daily.  Zofran as needed.   Mood controlled-refilled zoloft.   Trouble sleeping- discussed sleep hygiene. Started trazodone.   Follow up in 1 month.

## 2021-04-16 ENCOUNTER — Encounter: Payer: Self-pay | Admitting: Physician Assistant

## 2021-04-16 LAB — COMPLETE METABOLIC PANEL WITH GFR
AG Ratio: 1.5 (calc) (ref 1.0–2.5)
ALT: 15 U/L (ref 6–29)
AST: 14 U/L (ref 10–30)
Albumin: 4.1 g/dL (ref 3.6–5.1)
Alkaline phosphatase (APISO): 78 U/L (ref 31–125)
BUN: 9 mg/dL (ref 7–25)
CO2: 26 mmol/L (ref 20–32)
Calcium: 9.3 mg/dL (ref 8.6–10.2)
Chloride: 104 mmol/L (ref 98–110)
Creat: 0.58 mg/dL (ref 0.50–0.96)
Globulin: 2.7 g/dL (calc) (ref 1.9–3.7)
Glucose, Bld: 86 mg/dL (ref 65–99)
Potassium: 4.4 mmol/L (ref 3.5–5.3)
Sodium: 139 mmol/L (ref 135–146)
Total Bilirubin: 0.3 mg/dL (ref 0.2–1.2)
Total Protein: 6.8 g/dL (ref 6.1–8.1)
eGFR: 129 mL/min/{1.73_m2} (ref >=60)

## 2021-04-16 LAB — CBC WITH DIFFERENTIAL/PLATELET
Absolute Monocytes: 627 cells/uL (ref 200–950)
Basophils Absolute: 37 cells/uL (ref 0–200)
Basophils Relative: 0.3 %
Eosinophils Absolute: 148 cells/uL (ref 15–500)
Eosinophils Relative: 1.2 %
HCT: 43.1 % (ref 35.0–45.0)
Hemoglobin: 13.9 g/dL (ref 11.7–15.5)
Lymphs Abs: 2903 cells/uL (ref 850–3900)
MCH: 26.6 pg — ABNORMAL LOW (ref 27.0–33.0)
MCHC: 32.3 g/dL (ref 32.0–36.0)
MCV: 82.4 fL (ref 80.0–100.0)
MPV: 10.6 fL (ref 7.5–12.5)
Monocytes Relative: 5.1 %
Neutro Abs: 8585 cells/uL — ABNORMAL HIGH (ref 1500–7800)
Neutrophils Relative %: 69.8 %
Platelets: 247 10*3/uL (ref 140–400)
RBC: 5.23 10*6/uL — ABNORMAL HIGH (ref 3.80–5.10)
RDW: 13.6 % (ref 11.0–15.0)
Total Lymphocyte: 23.6 %
WBC: 12.3 10*3/uL — ABNORMAL HIGH (ref 3.8–10.8)

## 2021-04-16 LAB — HEMOGLOBIN A1C
Hgb A1c MFr Bld: 5.3 % of total Hgb (ref <5.7)
Mean Plasma Glucose: 105 mg/dL
eAG (mmol/L): 5.8 mmol/L

## 2021-04-16 LAB — TSH: TSH: 2.23 mIU/L

## 2021-04-16 LAB — HCG, QUANTITATIVE, PREGNANCY: HCG, Total, QN: <3 m[IU]/mL

## 2021-04-16 NOTE — Progress Notes (Signed)
[place],   Thyroid looks good.  Kidney, liver, glucose look good.  A1C is normal range. Your CBC does elude that you are fighting some infection. Your WBC and neutrophils are elevated. However looking back it looks like for last 2 years WBC been a little elevated as well as neutrophils.  JJ please add peripheral smear to take better took at WBC.   Any recent prednisone?

## 2021-04-21 IMAGING — MR MR HEAD W/O CM
10 of 11 series · 43 of 48 positions shown · non-contrast
Comparison: 10/09/2020 and prior.

CLINICAL DATA: Dizziness, non-specific dizziness

EXAM:
MRI HEAD WITHOUT CONTRAST
TECHNIQUE: Multiplanar, multiecho pulse sequences of the brain and surrounding
structures were obtained without intravenous contrast.

[Series 5: DWI · axial · 3.0mm · 0.88mm/px · z∈[-74,+73]mm · 10 of 100 slices shown (1 of 4)]
[im 1/100]
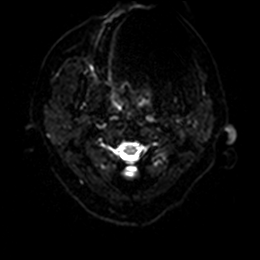
[im 12/100]
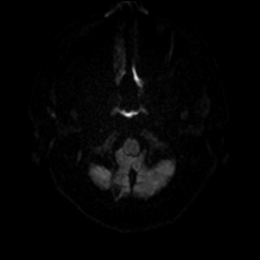
[im 23/100]
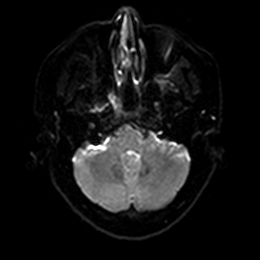
[im 34/100]
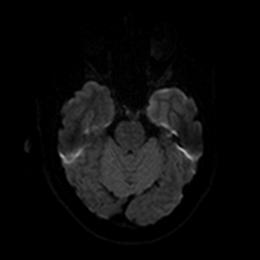
[im 45/100]
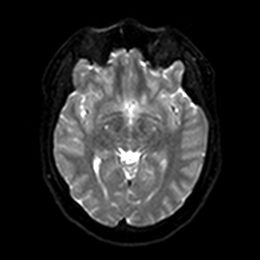
[im 56/100]
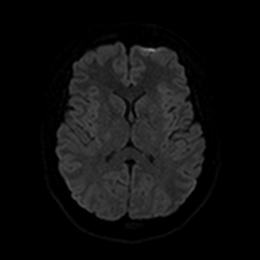
[im 67/100]
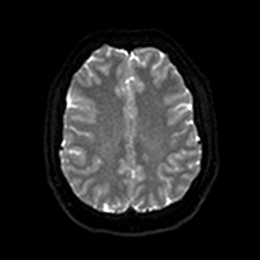
[im 78/100]
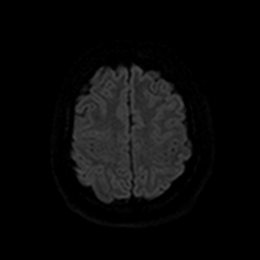
[im 89/100]
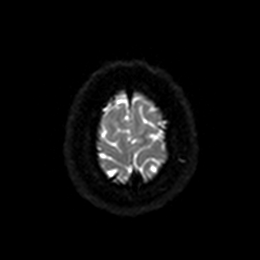
[im 100/100]
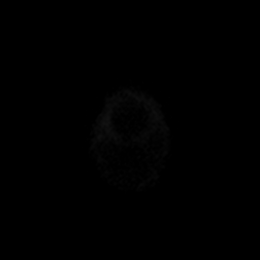

[Series 6: DWI · axial · 3.0mm · 0.88mm/px · z∈[-74,+73]mm · 5 of 50 slices shown (2 of 4)]
[im 1/50]
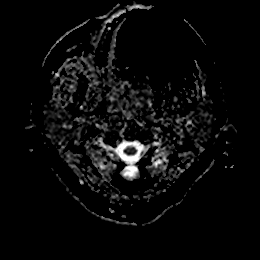
[im 13/50]
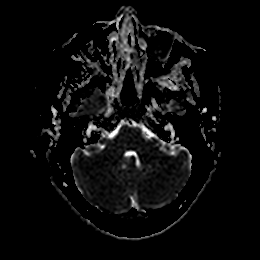
[im 25/50]
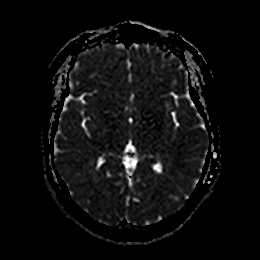
[im 37/50]
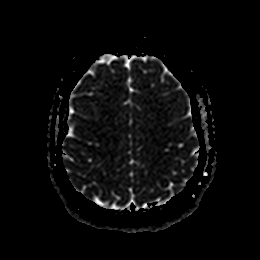
[im 50/50]
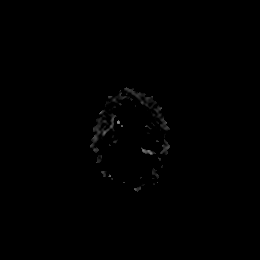

[Series 7: DWI · coronal · 4.0mm · 0.88mm/px · 6 of 66 slices shown (3 of 4)]
[im 1/66]
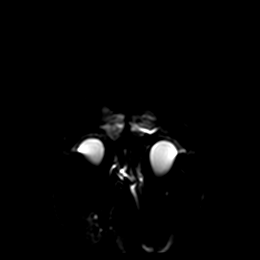
[im 14/66]
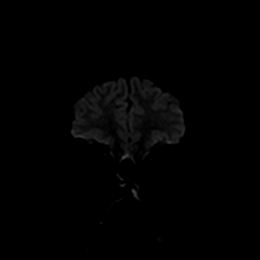
[im 27/66]
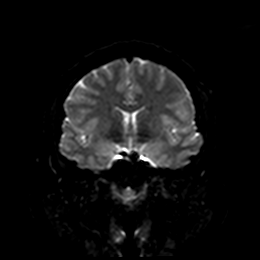
[im 40/66]
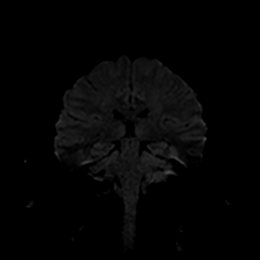
[im 53/66]
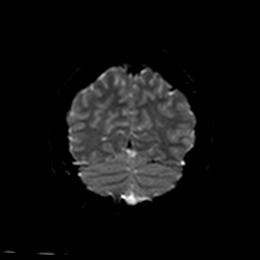
[im 66/66]
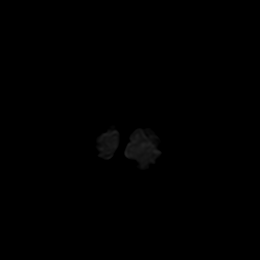

[Series 8: DWI · coronal · 4.0mm · 0.88mm/px · 3 of 33 slices shown (4 of 4)]
[im 1/33]
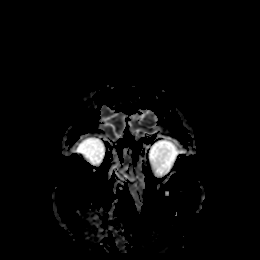
[im 17/33]
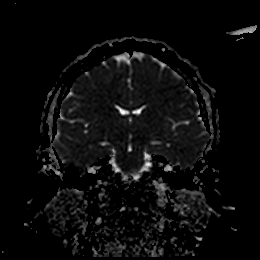
[im 33/33]
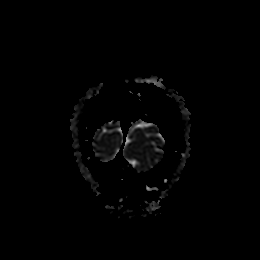

[Series 9: T1 · sagittal · 5.0mm · 0.75mm/px · 2 of 25 slices shown]
[im 1/25]
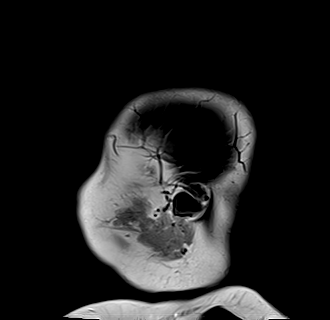
[im 25/25]
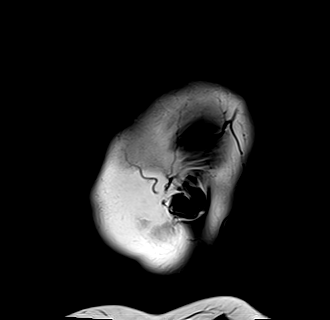

[Series 10: T2 · axial · 5.0mm · 0.72mm/px · z∈[-71,+73]mm · 2 of 25 slices shown (1 of 2)]
[im 1/25]
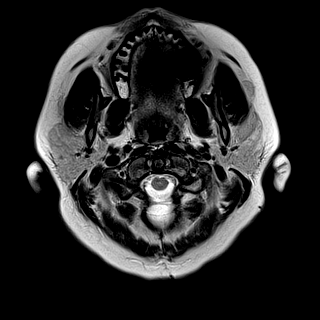
[im 25/25]
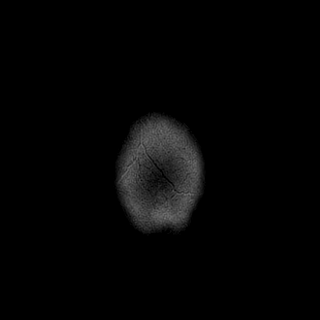

[Series 11: FLAIR · axial · 5.0mm · 0.45mm/px · z∈[-70,+73]mm · 2 of 25 slices shown]
[im 1/25]
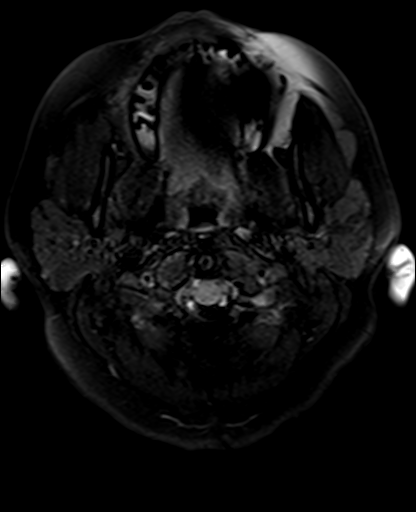
[im 25/25]
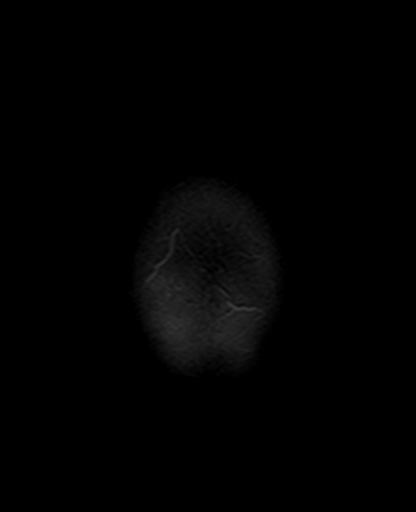

[Series 13: pha_images · axial · 3.0mm · 0.90mm/px · z∈[-87,+84]mm · 5 of 58 slices shown]
[im 1/58]
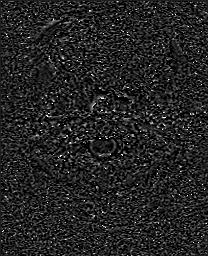
[im 15/58]
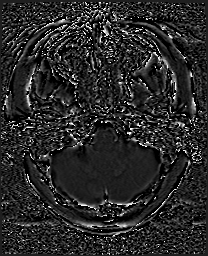
[im 29/58]
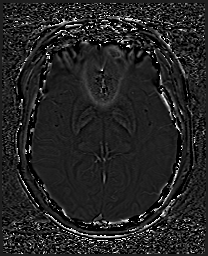
[im 43/58]
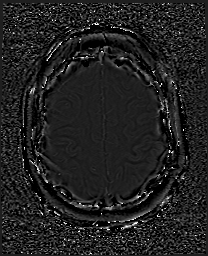
[im 58/58]
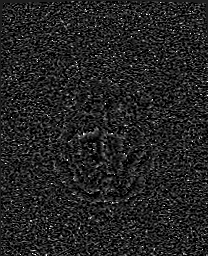

[Series 14: swi_images · axial · 3.0mm · 0.90mm/px · z∈[-87,+90]mm · 5 of 60 slices shown]
[im 1/60]
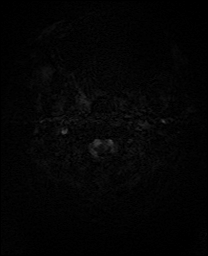
[im 15/60]
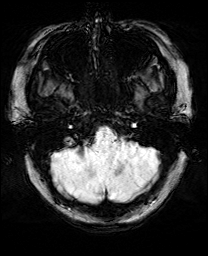
[im 30/60]
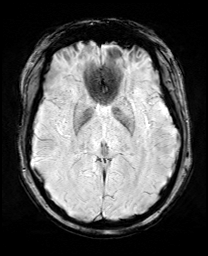
[im 45/60]
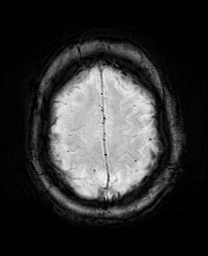
[im 60/60]
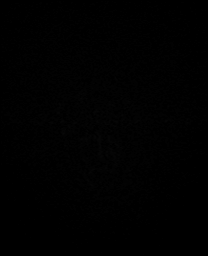

[Series 17: T2 · coronal · 5.0mm · 0.34mm/px · 3 of 29 slices shown (2 of 2)]
[im 1/29]
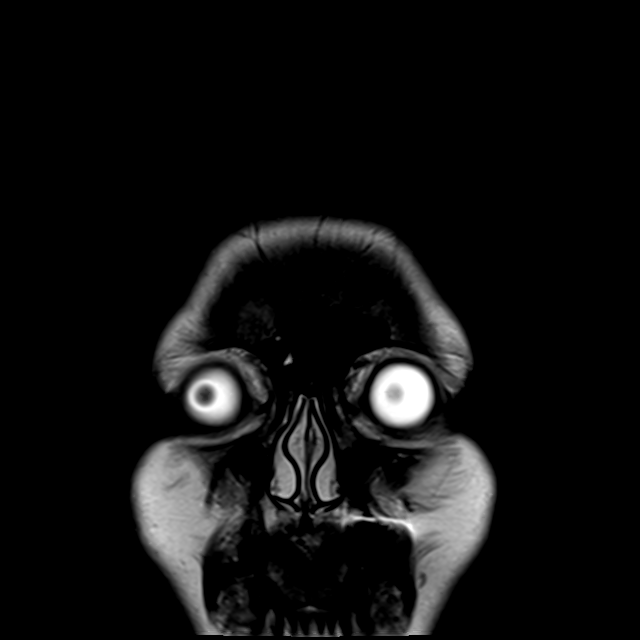
[im 15/29]
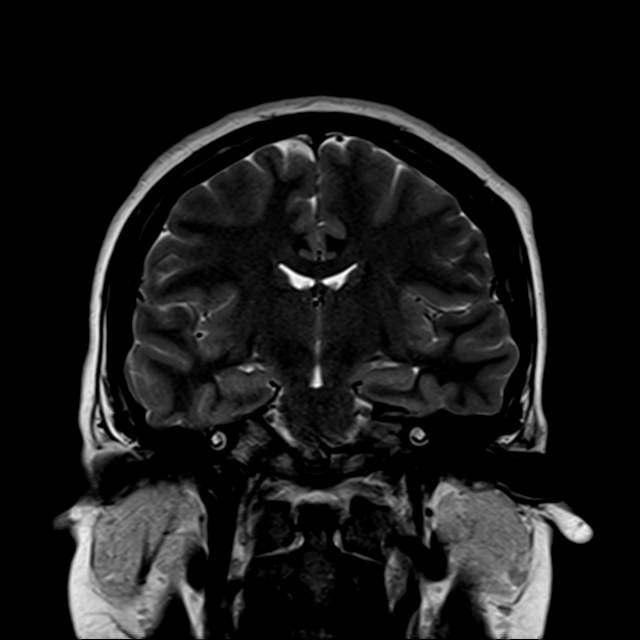
[im 29/29]
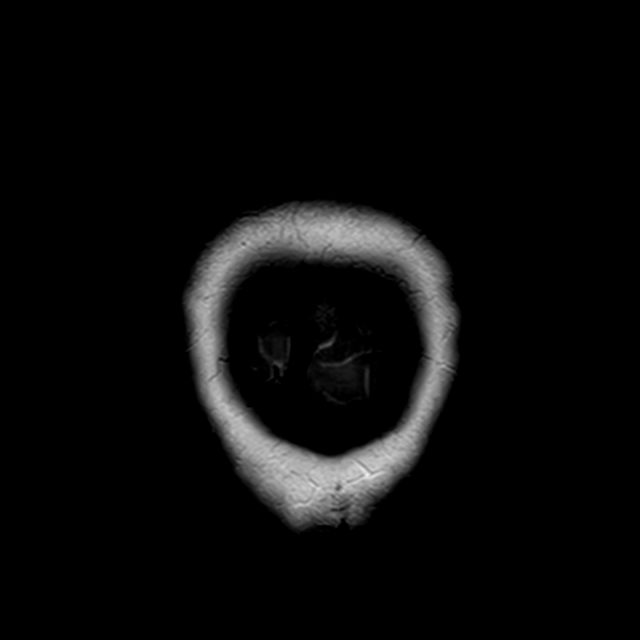

[43 of 48 positions shown; findings below may reference images not displayed]

FINDINGS: Brain: No diffusion-weighted signal abnormality. No intracranial
hemorrhage. No midline shift, ventriculomegaly or extra-axial fluid
collection. No mass lesion. Partially CSF filled sella turcica,
nonspecific.

Vascular: Normal flow voids.

Skull and upper cervical spine: Normal marrow signal. Sequela of
suboccipital craniectomy.

Sinuses/Orbits: Normal orbits. Right sphenoid sinus secretions. No
mastoid effusion.

Other: None.
IMPRESSION: No acute intracranial process.

Active right sphenoid sinus inflammation.

## 2021-04-26 ENCOUNTER — Other Ambulatory Visit: Payer: Self-pay | Admitting: Family Medicine

## 2021-04-27 ENCOUNTER — Telehealth: Payer: 59 | Admitting: Physician Assistant

## 2021-04-27 ENCOUNTER — Other Ambulatory Visit (HOSPITAL_BASED_OUTPATIENT_CLINIC_OR_DEPARTMENT_OTHER): Payer: Self-pay

## 2021-04-27 DIAGNOSIS — T7840XA Allergy, unspecified, initial encounter: Secondary | ICD-10-CM

## 2021-04-27 MED ORDER — TIZANIDINE HCL 4 MG PO TABS
4.0000 mg | ORAL_TABLET | Freq: Four times a day (QID) | ORAL | 0 refills | Status: DC | PRN
  Filled 2021-04-27: qty 30, 8d supply, fill #0

## 2021-04-27 NOTE — Progress Notes (Signed)
Based on what you shared with me, I feel your condition warrants further evaluation and I recommend that you be seen in a face to face visit.  I am very concerned you are having an allergic reaction to something. I feel you should be seen in person urgently as you are having swelling of the face and mouth and mentioned some shortness of breath. You may require steroids and need to be monitored to make sure you do not develop anaphylaxis.   NOTE: There will be NO CHARGE for this eVisit   If you are having a true medical emergency please call 911.      For an urgent face to face visit, [place] has six urgent care centers for your convenience:     [facility] at [facility] Directions [phone] [address] [place], [place] [address]    [facility]) Get Driving Directions [phone] [address] [place], [place] [address]  [facility] Health Urgent Care Center [facility]) Get Driving Directions [phone] [address] [place],  [place]  [address]  [facility] Urgent Care at [facility] Get Driving Directions [phone] [address] [place], [place] [address]   [facility] Health Urgent Care at [facility] Get Driving Directions  [phone] [address] [place], [place] [address]   [facility] Health Urgent Care at [facility] Directions [phone] [address], [address] [place], [place] [address]  Your MyChart E-visit questionnaire answers were reviewed by a board certified advanced clinical practitioner to complete your personal care plan based on your specific symptoms.  Thank you for using e-Visits.   I provided 6 minutes of non face-to-face time during this encounter for chart review and documentation.

## 2021-04-29 ENCOUNTER — Other Ambulatory Visit (HOSPITAL_BASED_OUTPATIENT_CLINIC_OR_DEPARTMENT_OTHER): Payer: Self-pay

## 2021-04-29 ENCOUNTER — Other Ambulatory Visit: Payer: Self-pay | Admitting: Physician Assistant

## 2021-04-29 ENCOUNTER — Encounter: Payer: Self-pay | Admitting: Physician Assistant

## 2021-04-29 DIAGNOSIS — R112 Nausea with vomiting, unspecified: Secondary | ICD-10-CM

## 2021-04-29 MED ORDER — METOCLOPRAMIDE HCL 10 MG PO TABS
ORAL_TABLET | ORAL | 3 refills | Status: DC
  Filled 2021-04-29: qty 30, 10d supply, fill #0
  Filled 2021-06-15: qty 30, 10d supply, fill #1
  Filled 2022-02-27: qty 30, 10d supply, fill #2

## 2021-05-14 ENCOUNTER — Other Ambulatory Visit (HOSPITAL_BASED_OUTPATIENT_CLINIC_OR_DEPARTMENT_OTHER): Payer: Self-pay

## 2021-05-23 ENCOUNTER — Telehealth: Payer: 59 | Admitting: Nurse Practitioner

## 2021-05-23 DIAGNOSIS — J40 Bronchitis, not specified as acute or chronic: Secondary | ICD-10-CM

## 2021-05-23 MED ORDER — ALBUTEROL SULFATE HFA 108 (90 BASE) MCG/ACT IN AERS: 2.0000 | INHALATION_SPRAY | Freq: Four times a day (QID) | RESPIRATORY_TRACT | 0 refills | Status: DC | PRN

## 2021-05-23 MED ORDER — PREDNISONE 5 MG PO TABS: 5.0000 mg | ORAL_TABLET | Freq: Every day | ORAL | 0 refills | Status: AC

## 2021-05-23 MED ORDER — BENZONATATE 100 MG PO CAPS: 100.0000 mg | ORAL_CAPSULE | Freq: Three times a day (TID) | ORAL | 0 refills | Status: DC | PRN

## 2021-05-23 NOTE — Progress Notes (Signed)
We are sorry that you are not feeling well.  Here is how we plan to help!  Based on your presentation I believe you most likely have A cough due to a virus.  This is called viral bronchitis and is best treated by rest, plenty of fluids and control of the cough.  You may use Ibuprofen or Tylenol as directed to help your symptoms.     In addition you may use A prescription cough medication called Tessalon Perles 100mg . You may take 1-2 capsules every 8 hours as needed for your cough.  Prednisone 5 mg daily for 6 days and your albuterol inhaler has been refilled for you  From your responses in the eVisit questionnaire you describe inflammation in the upper respiratory tract which is causing a significant cough.  This is commonly called Bronchitis and has four common causes:   Allergies Viral Infections Acid Reflux Bacterial Infection Allergies, viruses and acid reflux are treated by controlling symptoms or eliminating the cause. An example might be a cough caused by taking certain blood pressure medications. You stop the cough by changing the medication. Another example might be a cough caused by acid reflux. Controlling the reflux helps control the cough.  USE OF BRONCHODILATOR ("RESCUE") INHALERS: There is a risk from using your bronchodilator too frequently.  The risk is that over-reliance on a medication which only relaxes the muscles surrounding the breathing tubes can reduce the effectiveness of medications prescribed to reduce swelling and congestion of the tubes themselves.  Although you feel brief relief from the bronchodilator inhaler, your asthma may actually be worsening with the tubes becoming more swollen and filled with mucus.  This can delay other crucial treatments, such as oral steroid medications. If you need to use a bronchodilator inhaler daily, several times per day, you should discuss this with your provider.  There are probably better treatments that could be used to keep your  asthma under control.     HOME CARE Only take medications as instructed by your medical team. Complete the entire course of an antibiotic. Drink plenty of fluids and get plenty of rest. Avoid close contacts especially the very young and the elderly Cover your mouth if you cough or cough into your sleeve. Always remember to wash your hands A steam or ultrasonic humidifier can help congestion.   GET HELP RIGHT AWAY IF: You develop worsening fever. You become short of breath You cough up blood. Your symptoms persist after you have completed your treatment plan MAKE SURE YOU  Understand these instructions. Will watch your condition. Will get help right away if you are not doing well or get worse.    Thank you for choosing an e-visit.  Your e-visit answers were reviewed by a board certified advanced clinical practitioner to complete your personal care plan. Depending upon the condition, your plan could have included both over the counter or prescription medications.  Please review your pharmacy choice. Make sure the pharmacy is open so you can pick up prescription now. If there is a problem, you may contact your provider through and have the prescription routed to another pharmacy.  Your safety is important to [employer]. If you have drug allergies check your prescription carefully.   For the next 24 hours you can use MyChart to ask questions about today's visit, request a non-urgent call back, or ask for a work or school excuse. You will get an email in the next two days asking about your experience. I hope  that your e-visit has been valuable and will speed your recovery.

## 2021-05-23 NOTE — Progress Notes (Signed)
I have spent 5 minutes in review of e-visit questionnaire, review and updating patient chart, medical decision making and response to patient.  ° °Emma Boyer W Gerica Koble, NP ° °  °

## 2021-06-10 ENCOUNTER — Other Ambulatory Visit (HOSPITAL_BASED_OUTPATIENT_CLINIC_OR_DEPARTMENT_OTHER): Payer: Self-pay

## 2021-06-15 ENCOUNTER — Other Ambulatory Visit (HOSPITAL_BASED_OUTPATIENT_CLINIC_OR_DEPARTMENT_OTHER): Payer: Self-pay

## 2021-07-06 ENCOUNTER — Other Ambulatory Visit (HOSPITAL_BASED_OUTPATIENT_CLINIC_OR_DEPARTMENT_OTHER): Payer: Self-pay

## 2021-07-06 ENCOUNTER — Other Ambulatory Visit: Payer: Self-pay | Admitting: Physician Assistant

## 2021-07-07 ENCOUNTER — Other Ambulatory Visit (HOSPITAL_BASED_OUTPATIENT_CLINIC_OR_DEPARTMENT_OTHER): Payer: Self-pay

## 2021-07-07 MED ORDER — METHYLPHENIDATE HCL ER 20 MG PO TBCR
EXTENDED_RELEASE_TABLET | Freq: Every day | ORAL | 0 refills | Status: DC
  Filled 2021-07-07: qty 90, 90d supply, fill #0

## 2021-07-07 MED ORDER — TIZANIDINE HCL 4 MG PO TABS
4.0000 mg | ORAL_TABLET | Freq: Four times a day (QID) | ORAL | 0 refills | Status: DC | PRN
  Filled 2021-07-07: qty 30, 8d supply, fill #0

## 2021-07-07 NOTE — Telephone Encounter (Signed)
Patient aware prescription sent to pharmacy. 

## 2021-07-31 ENCOUNTER — Other Ambulatory Visit (HOSPITAL_BASED_OUTPATIENT_CLINIC_OR_DEPARTMENT_OTHER): Payer: Self-pay

## 2021-08-11 ENCOUNTER — Other Ambulatory Visit (HOSPITAL_BASED_OUTPATIENT_CLINIC_OR_DEPARTMENT_OTHER): Payer: Self-pay

## 2021-08-27 ENCOUNTER — Encounter: Payer: Self-pay | Admitting: Family Medicine

## 2021-08-27 ENCOUNTER — Telehealth (INDEPENDENT_AMBULATORY_CARE_PROVIDER_SITE_OTHER): Payer: BC Managed Care – PPO | Admitting: Family Medicine

## 2021-08-27 ENCOUNTER — Other Ambulatory Visit (HOSPITAL_BASED_OUTPATIENT_CLINIC_OR_DEPARTMENT_OTHER): Payer: Self-pay

## 2021-08-27 VITALS — Ht 68.0 in | Wt 300.0 lb

## 2021-08-27 DIAGNOSIS — R197 Diarrhea, unspecified: Secondary | ICD-10-CM | POA: Diagnosis not present

## 2021-08-27 DIAGNOSIS — R112 Nausea with vomiting, unspecified: Secondary | ICD-10-CM

## 2021-08-27 MED ORDER — PROMETHAZINE HCL 25 MG PO TABS
25.0000 mg | ORAL_TABLET | Freq: Four times a day (QID) | ORAL | 2 refills | Status: DC | PRN
  Filled 2021-08-27: qty 30, 8d supply, fill #0

## 2021-08-27 NOTE — Progress Notes (Signed)
Virtual Video Visit via MyChart Note converted to telephone   I connected with  Desma Mcgregor on 08/27/21 at  9:30 AM EST by the video enabled telemedicine application for MyChart, and verified that I am speaking with the correct person using two identifiers.   I introduced myself as a Designer, jewellery with the practice. We discussed the limitations of evaluation and management by telemedicine and the availability of in person appointments. The patient expressed understanding and agreed to proceed.  Participating parties in this visit include: The patient and the nurse practitioner listed.  The patient is: At home I am: In the office - [place]  Subjective:    CC:  Chief Complaint  Patient presents with   Diarrhea   Emesis    HPI: Emma Boyer is a 26 y.o. year old female presenting today via [place] today for nausea, vomiting, diarrhea.  Patient reports that yesterday around 5pm she started to have nausea, chills, diarrhea, vomiting, generalized body aches, low grade fever (which she thinks broke overnight), and mild headache. She has not had any relief with her Reglan or Imodium. States she just feels like crap, no appetite. She is unsure of any sick contacts. She had a negative COVID test last night. Denies eating anything out of her normal routine, no recent restaurant meals.  She denies any lethargy, confusion, decreased urination or signs of severe dehydration, chest pain, dyspnea.      Past medical history, Surgical history, Family history not pertinant except as noted below, Social history, Allergies, and medications have been entered into the medical record, reviewed, and corrections made.   Review of Systems:  All review of systems negative except what is listed in the HPI   Objective:    General:  Speaking clearly in complete sentences. Absent shortness of breath noted.   Alert and oriented x3.   Normal judgment.  Absent acute  distress.   Impression and Recommendations:    1. Nausea vomiting and diarrhea - promethazine (PHENERGAN) 25 MG tablet; Take 1 tablet (25 mg total) by mouth every 6 (six) hours as needed for nausea or vomiting.  Dispense: 30 tablet; Refill: 2  Discussed options with patient. She prefers not to drive up here to be tested for flu. Recommended she take another COVID test tonight or tomorrow in case she may have tested too early. Starting with conservative management for viral gastroenteritis - education handout attached to AVS: rest, hydration, clear liquids advancing to [employer] as tolerated, OTC remedies. Adding phenergan as she has not gotten relief with her reglan or zofran. Patient aware of signs/symptoms requiring further/urgent evaluation.   Follow-up if symptoms worsen or fail to improve.    I discussed the assessment and treatment plan with the patient. The patient was provided an opportunity to ask questions and all were answered. The patient agreed with the plan and demonstrated an understanding of the instructions.   The patient was advised to call back or seek an in-person evaluation if the symptoms worsen or if the condition fails to improve as anticipated.  I spent 20 minutes dedicated to the care of this patient on the date of this encounter to include pre-visit chart review of prior notes and results, face-to-face time with the patient, and post-visit ordering of testing as indicated.   Terrilyn Saver, NP

## 2021-08-31 ENCOUNTER — Other Ambulatory Visit (HOSPITAL_COMMUNITY): Payer: Self-pay

## 2021-09-16 ENCOUNTER — Other Ambulatory Visit: Payer: Self-pay | Admitting: Physician Assistant

## 2021-09-16 DIAGNOSIS — R112 Nausea with vomiting, unspecified: Secondary | ICD-10-CM

## 2021-09-16 DIAGNOSIS — K219 Gastro-esophageal reflux disease without esophagitis: Secondary | ICD-10-CM

## 2021-09-17 ENCOUNTER — Other Ambulatory Visit (HOSPITAL_COMMUNITY): Payer: Self-pay

## 2021-09-17 MED ORDER — OMEPRAZOLE 40 MG PO CPDR
40.0000 mg | DELAYED_RELEASE_CAPSULE | Freq: Every day | ORAL | 3 refills | Status: DC
  Filled 2021-09-17: qty 30, 30d supply, fill #0
  Filled 2021-10-22: qty 30, 30d supply, fill #1
  Filled 2021-12-08: qty 30, 30d supply, fill #2
  Filled 2022-01-15: qty 30, 30d supply, fill #3

## 2021-09-21 ENCOUNTER — Other Ambulatory Visit (HOSPITAL_COMMUNITY): Payer: Self-pay

## 2021-09-29 ENCOUNTER — Other Ambulatory Visit: Payer: Self-pay | Admitting: Physician Assistant

## 2021-09-29 DIAGNOSIS — G4700 Insomnia, unspecified: Secondary | ICD-10-CM

## 2021-10-01 ENCOUNTER — Other Ambulatory Visit (HOSPITAL_COMMUNITY): Payer: Self-pay

## 2021-10-01 MED ORDER — TRAZODONE HCL 50 MG PO TABS
25.0000 mg | ORAL_TABLET | Freq: Every evening | ORAL | 2 refills | Status: DC | PRN
  Filled 2021-10-01: qty 60, 30d supply, fill #0

## 2021-10-14 ENCOUNTER — Ambulatory Visit: Payer: BC Managed Care – PPO | Admitting: Physician Assistant

## 2021-10-22 ENCOUNTER — Other Ambulatory Visit: Payer: Self-pay | Admitting: Physician Assistant

## 2021-10-23 ENCOUNTER — Other Ambulatory Visit (HOSPITAL_COMMUNITY): Payer: Self-pay

## 2021-10-23 MED ORDER — TIZANIDINE HCL 4 MG PO TABS
4.0000 mg | ORAL_TABLET | Freq: Four times a day (QID) | ORAL | 0 refills | Status: DC | PRN
  Filled 2021-10-23: qty 30, 8d supply, fill #0

## 2021-10-26 ENCOUNTER — Telehealth: Payer: BC Managed Care – PPO | Admitting: Physician Assistant

## 2021-10-26 DIAGNOSIS — B354 Tinea corporis: Secondary | ICD-10-CM | POA: Diagnosis not present

## 2021-10-26 MED ORDER — NYSTATIN 100000 UNIT/GM EX CREA: 1.0000 "application " | TOPICAL_CREAM | Freq: Two times a day (BID) | CUTANEOUS | 0 refills | Status: DC

## 2021-10-26 NOTE — Progress Notes (Signed)
E Visit for Rash  We are sorry that you are not feeling well. Here is how we plan to help!  Based upon your presentation it appears you have a fungal infection.  I have prescribed: and Nystatin cream apply to the affected area twice daily   HOME CARE:  Take cool showers and avoid direct sunlight. Apply cool compress or wet dressings. Take a bath in an oatmeal bath.  Sprinkle content of one Aveeno packet under running faucet with comfortably warm water.  Bathe for 15-20 minutes, 1-2 times daily.  Pat dry with a towel. Do not rub the rash. Use hydrocortisone cream. Take an antihistamine like Benadryl for widespread rashes that itch.  The adult dose of Benadryl is 25-50 mg by mouth 4 times daily. Caution:  This type of medication may cause sleepiness.  Do not drink alcohol, drive, or operate dangerous machinery while taking antihistamines.  Do not take these medications if you have prostate enlargement.  Read package instructions thoroughly on all medications that you take.  GET HELP RIGHT AWAY IF:  Symptoms don't go away after treatment. Severe itching that persists. If you rash spreads or swells. If you rash begins to smell. If it blisters and opens or develops a yellow-brown crust. You develop a fever. You have a sore throat. You become short of breath.  MAKE SURE YOU:  Understand these instructions. Will watch your condition. Will get help right away if you are not doing well or get worse.  Thank you for choosing an e-visit.  Your e-visit answers were reviewed by a board certified advanced clinical practitioner to complete your personal care plan. Depending upon the condition, your plan could have included both over the counter or prescription medications.  Please review your pharmacy choice. Make sure the pharmacy is open so you can pick up prescription now. If there is a problem, you may contact your provider through MyChart messaging and have the prescription routed to  another pharmacy.  Your safety is important to us. If you have drug allergies check your prescription carefully.   For the next 24 hours you can use MyChart to ask questions about today's visit, request a non-urgent call back, or ask for a work or school excuse. You will get an email in the next two days asking about your experience. I hope that your e-visit has been valuable and will speed your recovery.   I provided 5 minutes of non face-to-face time during this encounter for chart review and documentation.   

## 2021-10-31 ENCOUNTER — Telehealth: Payer: BC Managed Care – PPO | Admitting: Physician Assistant

## 2021-10-31 DIAGNOSIS — M545 Low back pain, unspecified: Secondary | ICD-10-CM

## 2021-10-31 MED ORDER — NAPROXEN 500 MG PO TABS: 500.0000 mg | ORAL_TABLET | Freq: Two times a day (BID) | ORAL | 0 refills | Status: DC | PRN

## 2021-10-31 MED ORDER — CYCLOBENZAPRINE HCL 10 MG PO TABS: 10.0000 mg | ORAL_TABLET | Freq: Three times a day (TID) | ORAL | 0 refills | Status: DC | PRN

## 2021-10-31 NOTE — Progress Notes (Signed)

## 2021-11-02 ENCOUNTER — Other Ambulatory Visit: Payer: Self-pay

## 2021-11-02 ENCOUNTER — Emergency Department (INDEPENDENT_AMBULATORY_CARE_PROVIDER_SITE_OTHER)
Admission: EM | Admit: 2021-11-02 | Discharge: 2021-11-02 | Disposition: A | Discharge status: Home / Self Care | Payer: BC Managed Care – PPO | Source: Home / Self Care

## 2021-11-02 ENCOUNTER — Emergency Department (INDEPENDENT_AMBULATORY_CARE_PROVIDER_SITE_OTHER): Payer: BC Managed Care – PPO

## 2021-11-02 ENCOUNTER — Encounter: Payer: Self-pay | Admitting: Emergency Medicine

## 2021-11-02 ENCOUNTER — Other Ambulatory Visit (HOSPITAL_BASED_OUTPATIENT_CLINIC_OR_DEPARTMENT_OTHER): Payer: Self-pay

## 2021-11-02 DIAGNOSIS — S39012A Strain of muscle, fascia and tendon of lower back, initial encounter: Secondary | ICD-10-CM | POA: Diagnosis not present

## 2021-11-02 DIAGNOSIS — M62830 Muscle spasm of back: Secondary | ICD-10-CM

## 2021-11-02 DIAGNOSIS — M545 Low back pain, unspecified: Secondary | ICD-10-CM | POA: Diagnosis not present

## 2021-11-02 DIAGNOSIS — M5431 Sciatica, right side: Secondary | ICD-10-CM

## 2021-11-02 MED ORDER — KETOROLAC TROMETHAMINE 60 MG/2ML IM SOLN
60.0000 mg | Freq: Once | INTRAMUSCULAR | Status: AC
  Administered 2021-11-02: 60 mg via INTRAMUSCULAR

## 2021-11-02 MED ORDER — METHYLPREDNISOLONE 4 MG PO TBPK
ORAL_TABLET | ORAL | 0 refills | Status: DC
  Filled 2021-11-02: qty 21, 6d supply, fill #0

## 2021-11-02 MED ORDER — BACLOFEN 10 MG PO TABS
10.0000 mg | ORAL_TABLET | Freq: Three times a day (TID) | ORAL | 0 refills | Status: DC
  Filled 2021-11-02: qty 30, 10d supply, fill #0

## 2021-11-02 MED ORDER — METHYLPREDNISOLONE SODIUM SUCC 125 MG IJ SOLR
125.0000 mg | Freq: Once | INTRAMUSCULAR | Status: AC
  Administered 2021-11-02: 125 mg via INTRAMUSCULAR

## 2021-11-02 NOTE — ED Triage Notes (Signed)
Pt c/o low back pain sine Wednesday. States she had evisit on Saturday and was given muscle relaxer with no relief. States pain is worse this am.  ?

## 2021-11-02 NOTE — Discharge Instructions (Addendum)
Advised patient of LS spine x-ray results today with hard copy provided to patient.  Instructed patient not to take any more ibuprofen or NSAIDs today.  Advised patient may use Baclofen daily or as needed for muscle spasms.  Instructed patient to start Medrol Dosepak tomorrow morning, Tuesday, 11/03/2021.  Encouraged patient to increase daily water intake while taking these medications.  Advised patient if lower back pain worsens and/or unresolved please follow-up with PCP for further evaluation and possibly advanced imaging. ?

## 2021-11-02 NOTE — ED Provider Notes (Signed)
?[facility] ? ? ? ?CSN: [id number] ?Arrival date & time: 11/02/21  5361 ? ? ?  ? ?History   ?Chief Complaint ?Chief Complaint  ?Patient presents with  ? Back Pain  ? ? ?HPI ?Emma Boyer is a 26 y.o. female.  ? ?HPI 26 year old female presents with lower back pain since Wednesday (10/28/2021) of last week.  Patient reports had an ED visit on Saturday (10/31/2021 given her muscle relaxer with little to no relief.  Patient was initially given Tizanidine and and Naproxen, muscle relaxer switched to Cyclobenzaprine. PMH significant for morbid obesity, bulging lumbar disc, and low back pain.  LS-spine x-ray of 02/07/2019 revealed thoracolumbar levoscoliosis and no fracture or spondylolisthesis.  MRI of the lumbar spine without contrast of 01/19/2013 revealed mild left subarticular lateral recess stenosis at L4 4 through L5 and L5-S1 due to disc bulges. ? ?Past Medical History:  ?Diagnosis Date  ? Bulging lumbar disc 2014  ? HA (headache)   ? Low back pain   ? Neck pain   ? Pneumonia   ? Pseudopapilledema   ? Shoulder pain   ? Vision abnormalities   ? wears contacts, right eye worse  ? ? ?Patient Active Problem List  ? Diagnosis Date Noted  ? Non-intractable vomiting 04/15/2021  ? Class 3 severe obesity due to excess calories without serious comorbidity with body mass index (BMI) of 50.0 to 59.9 in adult [facility]) 04/15/2021  ? Gastroesophageal reflux disease 04/15/2021  ? Acute suppurative inflammation of sphenoidal sinus 10/13/2020  ? Empty sella turcica (HCC) 10/13/2020  ? Benign paroxysmal positional vertigo, right 10/13/2020  ? Insomnia 10/13/2020  ? Anxiety 10/13/2020  ? H/O degenerative disc disease 02/07/2019  ? 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 Hemoptysis 09/07/2016  ? De Quervain's tenosynovitis, bilateral 11/21/2015  ? History of migraine headaches 08/29/2015  ? Abnormal weight gain 01/27/2015  ? Migraine headache 01/27/2015  ? Pseudopapilledema   ? Shoulder pain   ? Chiari  malformation type I (HCC) 04/10/2013  ? Difficulty concentrating 09/08/2012  ? ? ?Past Surgical History:  ?Procedure Laterality Date  ? None    ? SUBOCCIPITAL CRANIECTOMY CERVICAL LAMINECTOMY N/A 05/01/2013  ? Procedure: Chiari Decompression;  Surgeon: Maeola Harman, MD;  Location: [facility];  Service: Neurosurgery;  Laterality: N/A;  Chiari Decompression  ? ? ?OB History   ?No obstetric history on file. ?  ? ? ? ?Home Medications   ? ?Prior to Admission medications   ?Medication Sig Start Date End Date Taking? Authorizing Provider  ?baclofen (LIORESAL) 10 MG tablet Take 1 tablet (10 mg total) by mouth 3 (three) times daily. 11/02/21  Yes Trevor Iha, FNP  ?methylPREDNISolone (MEDROL DOSEPAK) 4 MG TBPK tablet Take by mouth as directed on package 11/02/21  Yes Trevor Iha, FNP  ?albuterol (VENTOLIN HFA) 108 (90 Base) MCG/ACT inhaler Inhale 2 puffs into the lungs every 6 (six) hours as needed for wheezing or shortness of breath. 05/23/21   Claiborne Rigg, NP  ?cyclobenzaprine (FLEXERIL) 10 MG tablet Take 1 tablet (10 mg total) by mouth 3 (three) times daily as needed for muscle spasms. 10/31/21   Jarold Motto, PA  ?diazepam (VALIUM) 2 MG tablet Take 1 tablet (2 mg total) by mouth every 12 (twelve) hours as needed for anxiety. 10/13/20   Breeback, Lonna Cobb, PA-C  ?fluticasone (FLONASE) 50 MCG/ACT nasal spray Place 2 sprays into both nostrils daily. ?Patient taking differently: Place 2 sprays into both nostrils daily as needed for allergies or  rhinitis. 08/25/20   Junie Spencer, FNP  ?meclizine (ANTIVERT) 25 MG tablet Take 1 tablet (25 mg total) by mouth 3 (three) times daily as needed for dizziness. 10/09/20   Milagros Loll, MD  ?methylphenidate (METADATE ER) 20 MG ER tablet TAKE 1 TABLET BY MOUTH DAILY 07/07/21 01/03/22  Tandy Gaw L, PA-C  ?metoCLOPramide (REGLAN) 10 MG tablet Take 1 tablet by mouth 3 times a day as needed for nausea 04/29/21   Tandy Gaw L, PA-C  ?naproxen (NAPROSYN) 500 MG tablet  Take 1 tablet (500 mg total) by mouth 2 (two) times daily as needed for mild pain or moderate pain. 10/31/21   Jarold Motto, PA  ?nystatin cream (MYCOSTATIN) Apply 1 application. topically 2 (two) times daily. 10/26/21   Margaretann Loveless, PA-C  ?Omega-3 Fatty Acids (FISH OIL PO) Take 1 capsule by mouth daily with breakfast.    [provider]  ?omeprazole (PRILOSEC) 40 MG capsule Take 1 capsule (40 mg total) by mouth daily. 09/17/21   Jomarie Longs, PA-C  ?ondansetron (ZOFRAN) 4 MG tablet Take 1 tablet (4 mg total) by mouth every 8 (eight) hours as needed for nausea or vomiting. 04/15/21   Tandy Gaw L, PA-C  ?promethazine (PHENERGAN) 25 MG tablet Take 1 tablet (25 mg total) by mouth every 6 (six) hours as needed for nausea or vomiting. 08/27/21   Clayborne Dana, NP  ?propranolol (INDERAL) 10 MG tablet Take 10 mg by mouth 3 (three) times daily as needed (for anxiety). 07/14/20   [provider]  ?sertraline (ZOLOFT) 100 MG tablet Take 2 tablets (200 mg total) by mouth in the morning. 04/15/21   Breeback, Lonna Cobb, PA-C  ?traZODone (DESYREL) 50 MG tablet Take 1/2 - 2 tablets by mouth at bedtime as needed for sleep. 10/01/21   Jomarie Longs, PA-C  ? ? ?Family History ?Family History  ?Problem Relation Age of Onset  ? High blood pressure Father   ? Arthritis Father   ? Anxiety disorder Mother   ? Depression Mother   ? Anxiety disorder Sister   ? Depression Sister   ? Physical abuse Sister   ? Sexual abuse Sister   ? ADD / ADHD Maternal Aunt   ? Anxiety disorder Maternal Aunt   ? Depression Maternal Aunt   ? Anxiety disorder Maternal Grandfather   ? Dementia Maternal Grandfather   ? Depression Maternal Grandfather   ? ? ?Social History ?Social History  ? ?Tobacco Use  ? Smoking status: Former  ?  Packs/day: 0.10  ?  Years: 7.00  ?  Pack years: 0.70  ?  Types: Cigarettes  ?  Quit date: 03/18/2017  ?  Years since quitting: 4.6  ? Smokeless tobacco: Never  ? Tobacco comments:  ?  Smokes about 2 per  day.  ?Vaping Use  ? Vaping Use: Former  ?Substance Use Topics  ? Alcohol use: Yes  ?  Alcohol/week: 1.0 standard drink  ?  Types: 1 Glasses of wine per week  ?  Comment: Occasionally  ? Drug use: No  ? ? ? ?Allergies   ?Peanut-containing drug products ? ? ?Review of Systems ?Review of Systems  ?Musculoskeletal:  Positive for back pain.  ?All other systems reviewed and are negative. ? ? ?Physical Exam ?Triage Vital Signs ?ED Triage Vitals  ?Enc Vitals Group  ?   BP 11/02/21 0941 121/80  ?   Pulse Rate 11/02/21 0941 94  ?   Resp 11/02/21 0941 18  ?  Temp 11/02/21 0941 97.8 ?F (36.6 ?C)  ?   Temp Source 11/02/21 0941 Oral  ?   SpO2 11/02/21 0941 97 %  ?   Weight 11/02/21 0943 300 lb (136.1 kg)  ?   Height --   ?   Head Circumference --   ?   Peak Flow --   ?   Pain Score 11/02/21 0943 8  ?   Pain Loc --   ?   Pain Edu? --   ?   Excl. in GC? --   ? ?No data found. ? ?Updated Vital Signs ?BP 121/80 (BP Location: Right Arm)   Pulse 94   Temp 97.8 ?F (36.6 ?C) (Oral)   Resp 18   Wt 300 lb (136.1 kg)   LMP 10/21/2021 (Approximate)   SpO2 97%   BMI 45.61 kg/m?  ? ? ?Physical Exam ?Vitals and nursing note reviewed.  ?Constitutional:   ?   General: She is not in acute distress. ?   Appearance: Normal appearance. She is obese. She is ill-appearing.  ?HENT:  ?   Head: Normocephalic and atraumatic.  ?   Mouth/Throat:  ?   Mouth: Mucous membranes are moist.  ?   Pharynx: Oropharynx is clear.  ?Eyes:  ?   Extraocular Movements: Extraocular movements intact.  ?   Conjunctiva/sclera: Conjunctivae normal.  ?   Pupils: Pupils are equal, round, and reactive to light.  ?Cardiovascular:  ?   Rate and Rhythm: Normal rate and regular rhythm.  ?   Pulses: Normal pulses.  ?   Heart sounds: Normal heart sounds. No murmur heard. ?Pulmonary:  ?   Effort: Pulmonary effort is normal.  ?   Breath sounds: Normal breath sounds. No wheezing, rhonchi or rales.  ?Musculoskeletal:  ?   Cervical back: Normal range of motion and neck supple.  ?    Comments: Lower back/lumbar sacral spine (central inferior aspect): TTP over spinous processes, bilateral paraspinous muscles, bilateral inferior spinal erectors, with palpable muscle adhesions noted.  ?Skin: ?

## 2021-11-10 ENCOUNTER — Other Ambulatory Visit: Payer: Self-pay | Admitting: Physician Assistant

## 2021-11-10 NOTE — Telephone Encounter (Signed)
Last written in November.  ?Last seen in August. Needs appt scheduled for further refills. Please call to schedule follow up ADHD.  ?

## 2021-11-10 NOTE — Telephone Encounter (Signed)
We have reached out to patient to get an appt. Please sign refusal.  ?

## 2021-11-10 NOTE — Telephone Encounter (Signed)
LVM for patient to call back to get an appt with [facility] scheduled. AM ?

## 2021-11-11 ENCOUNTER — Other Ambulatory Visit (HOSPITAL_COMMUNITY): Payer: Self-pay

## 2021-11-19 ENCOUNTER — Ambulatory Visit (INDEPENDENT_AMBULATORY_CARE_PROVIDER_SITE_OTHER): Payer: BC Managed Care – PPO | Admitting: Physician Assistant

## 2021-11-19 ENCOUNTER — Other Ambulatory Visit (HOSPITAL_BASED_OUTPATIENT_CLINIC_OR_DEPARTMENT_OTHER): Payer: Self-pay

## 2021-11-19 ENCOUNTER — Encounter: Payer: Self-pay | Admitting: Physician Assistant

## 2021-11-19 VITALS — BP 125/86 | HR 102 | Ht 68.0 in | Wt 362.0 lb

## 2021-11-19 DIAGNOSIS — F902 Attention-deficit hyperactivity disorder, combined type: Secondary | ICD-10-CM | POA: Diagnosis not present

## 2021-11-19 DIAGNOSIS — S39012D Strain of muscle, fascia and tendon of lower back, subsequent encounter: Secondary | ICD-10-CM | POA: Diagnosis not present

## 2021-11-19 DIAGNOSIS — S39012A Strain of muscle, fascia and tendon of lower back, initial encounter: Secondary | ICD-10-CM | POA: Insufficient documentation

## 2021-11-19 DIAGNOSIS — R41840 Attention and concentration deficit: Secondary | ICD-10-CM

## 2021-11-19 DIAGNOSIS — Z6841 Body Mass Index (BMI) 40.0 and over, adult: Secondary | ICD-10-CM

## 2021-11-19 DIAGNOSIS — G4700 Insomnia, unspecified: Secondary | ICD-10-CM

## 2021-11-19 MED ORDER — METHYLPHENIDATE HCL ER 20 MG PO TBCR
EXTENDED_RELEASE_TABLET | Freq: Every day | ORAL | 0 refills | Status: DC
  Filled 2021-11-19: qty 90, 90d supply, fill #0

## 2021-11-19 MED ORDER — WEGOVY 0.25 MG/0.5ML ~~LOC~~ SOAJ
0.2500 mg | SUBCUTANEOUS | 0 refills | Status: DC
  Filled 2021-11-19: qty 2, 28d supply, fill #0

## 2021-11-19 MED ORDER — TRAZODONE HCL 50 MG PO TABS
25.0000 mg | ORAL_TABLET | Freq: Every evening | ORAL | 0 refills | Status: DC | PRN
  Filled 2021-11-19: qty 60, 30d supply, fill #0
  Filled 2021-12-29: qty 60, 30d supply, fill #1
  Filled 2022-01-30: qty 60, 30d supply, fill #2

## 2021-11-19 MED ORDER — METHYLPREDNISOLONE 4 MG PO TBPK
ORAL_TABLET | ORAL | 0 refills | Status: DC
  Filled 2021-11-19: qty 21, 6d supply, fill #0

## 2021-11-19 NOTE — Progress Notes (Signed)
? ?Subjective:  ? ? Patient ID: Emma Boyer, female    DOB: [birth year], 26 y.o.   MRN: [id number] ? ?HPI ?Pt is a 26 yo female who presents to the clinic for medication refills.  ? ?Pt is doing well with concentration on methadate. No concerns.  ? ?She is doing well with sleeping on trazadone.  ? ?She has had some low back pain that is new. No known trigger. She did have some sciatica pain down her right leg. No bowel or bladder dysfunction or saddle anesthesia. She was seen by UC and given NSAID and muscle relaxer. It hsa helped some but still feels pain in low back and "grabbing sensation" with some movements.  ? ? ?.. ?Active Ambulatory Problems  ?  Diagnosis Date Noted  ? Difficulty concentrating 09/08/2012  ? 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  ? Pseudopapilledema   ? Shoulder pain   ? Abnormal weight gain 01/27/2015  ? Migraine headache 01/27/2015  ? History of migraine headaches 08/29/2015  ? De Quervain's tenosynovitis, bilateral 11/21/2015  ? Hemoptysis 09/07/2016  ? 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 H/O degenerative disc disease 02/07/2019  ? Acute suppurative inflammation of sphenoidal sinus 10/13/2020  ? Empty sella turcica (HCC) 10/13/2020  ? Benign paroxysmal positional vertigo, right 10/13/2020  ? Insomnia 10/13/2020  ? Anxiety 10/13/2020  ? Non-intractable vomiting 04/15/2021  ? Class 3 severe obesity due to excess calories without serious comorbidity with body mass index (BMI) of 50.0 to 59.9 in adult [facility]) 04/15/2021  ? Gastroesophageal reflux disease 04/15/2021  ? Strain of lumbar region 11/19/2021  ? Attention deficit hyperactivity disorder (ADHD), combined type 11/22/2021  ? ?Resolved Ambulatory Problems  ?  Diagnosis Date Noted  ? OTITIS MEDIA, SEROUS, ACUTE, LEFT 05/18/2009  ? Acute pharyngitis 04/28/2010  ? URI 05/18/2009  ? Preventive measure 09/08/2012  ? Low back pain 04/20/2013  ? Cerumen impaction 01/14/2014  ? Influenza A 09/23/2015  ? Viral syndrome  10/06/2018  ? ?Past Medical History:  ?Diagnosis Date  ? Bulging lumbar disc 2014  ? HA (headache)   ? Neck pain   ? Pneumonia   ? Vision abnormalities   ? ? ? ?Review of Systems ?See HPI.  ?   ?Objective:  ? Physical Exam ?Vitals reviewed.  ?Constitutional:   ?   Appearance: Normal appearance. She is obese.  ?HENT:  ?   Head: Normocephalic.  ?   Nose: Nose normal.  ?   Mouth/Throat:  ?   Mouth: Mucous membranes are moist.  ?   Pharynx: No oropharyngeal exudate or posterior oropharyngeal erythema.  ?Eyes:  ?   Extraocular Movements: Extraocular movements intact.  ?   Conjunctiva/sclera: Conjunctivae normal.  ?   Pupils: Pupils are equal, round, and reactive to light.  ?Cardiovascular:  ?   Rate and Rhythm: Normal rate and regular rhythm.  ?   Pulses: Normal pulses.  ?   Heart sounds: Normal heart sounds.  ?Pulmonary:  ?   Effort: Pulmonary effort is normal.  ?   Breath sounds: Normal breath sounds.  ?Abdominal:  ?   General: Bowel sounds are normal.  ?   Palpations: Abdomen is soft.  ?Musculoskeletal:  ?   Right lower leg: No edema.  ?   Left lower leg: No edema.  ?   Comments: NROM at waist ?Tenderness over lumbar paraspinal muscles ?Lower ext strength 5/5.   ?Neurological:  ?   General: No focal deficit present.  ?  Mental Status: She is alert.  ?Psychiatric:     ?   Mood and Affect: Mood normal.  ? ? ? ? ? ?   ?Assessment & Plan:  ?..Emma Boyer was seen today for adhd. ? ?Diagnoses and all orders for this visit: ? ?Attention deficit hyperactivity disorder (ADHD), combined type ? ?Insomnia, unspecified type ?-     traZODone (DESYREL) 50 MG tablet; Take 1/2 - 2 tablets by mouth at bedtime as needed for sleep. ? ?Strain of lumbar region, subsequent encounter ?-     methylPREDNISolone (MEDROL DOSEPAK) 4 MG TBPK tablet; Take as directed by package insert. ? ?Class 3 severe obesity due to excess calories without serious comorbidity with body mass index (BMI) of 50.0 to 59.9 in adult [facility]) ?-     WEGOVY 0.25 MG/0.5ML SOAJ;  Inject 0.25 mg into the skin once a week. Use this dose for 1 month (4 shots) and then increase to next higher dose. ? ?Difficulty concentrating ?-     methylphenidate (METADATE ER) 20 MG ER tablet; TAKE 1 TABLET BY MOUTH DAILY ? ? ?Refilled methadate and trazodone.  ? ?Marland Kitchen.Discussed low carb diet with 1500 calories and 80g of protein.  ?Exercising at least 150 minutes a week.  ?[employer] could be a Chief Technology Officer.  ?Started wegovy. Discussed SE and titration up.  ?Follow up in 3 months.  ? ?Discussed low back pain.  ?Added medrol dose pack. ?Discussed tens unit, icy hot patches, muscle relaxer, NSAId, and exercises.  ?Follow up as needed.  ?

## 2021-11-20 ENCOUNTER — Other Ambulatory Visit (HOSPITAL_BASED_OUTPATIENT_CLINIC_OR_DEPARTMENT_OTHER): Payer: Self-pay

## 2021-11-22 DIAGNOSIS — F902 Attention-deficit hyperactivity disorder, combined type: Secondary | ICD-10-CM | POA: Insufficient documentation

## 2021-11-23 ENCOUNTER — Other Ambulatory Visit (HOSPITAL_BASED_OUTPATIENT_CLINIC_OR_DEPARTMENT_OTHER): Payer: Self-pay

## 2021-11-24 ENCOUNTER — Other Ambulatory Visit (HOSPITAL_BASED_OUTPATIENT_CLINIC_OR_DEPARTMENT_OTHER): Payer: Self-pay

## 2021-11-25 ENCOUNTER — Other Ambulatory Visit (HOSPITAL_BASED_OUTPATIENT_CLINIC_OR_DEPARTMENT_OTHER): Payer: Self-pay

## 2021-11-25 ENCOUNTER — Encounter: Payer: Self-pay | Admitting: Physician Assistant

## 2021-11-25 ENCOUNTER — Encounter (INDEPENDENT_AMBULATORY_CARE_PROVIDER_SITE_OTHER): Payer: Self-pay

## 2021-11-25 ENCOUNTER — Telehealth: Payer: Self-pay

## 2021-11-25 NOTE — Telephone Encounter (Signed)
We cannot you use phentermine because she is already on a stimulant.  We could consider Contrave, but when I type it in it looks like it is actually not covered on her plan.  If she is sure that this 1 is currently covered if it is I will still send it anyway and we can always try. ?

## 2021-11-25 NOTE — Telephone Encounter (Addendum)
Initiated Prior authorization ZOX:WRUEAV 0.25MG /0.5ML auto-injectors ?Via: Covermymeds ?Case/Key: [id number] ?Status: denied as of 11/25/21 ?Reason:You or your doctor recently requested prescription coverage for Wegovy 0.25MG /0.5ML [place] SOAJ. ?After careful consideration and review of your prescription plan's drug list and the information sent to ?[place], this request was not approved. This is the initial adverse coverage determination for this request. ?We understand that this decision may not be what you and your doctor expected. This letter and the ?enclosed information will explain your options and help you decide what to do next. ?Why your request was denied: ?*Drug Not Covered/Plan Exclusion - Your request for coverage was denied because your ?prescription benefit plan does not cover the requested medication. Marland Kitchen ?This decision relates specifically to coverage provided under your prescription benefit plan and does ?not involve any determination of medical judgment. ?We reviewed all the supporting information sent to [place] and used your plan?s guidelines to make our ?decision. If you?d like [place] to send you a free copy of the guidelines (requirements, criteria, or ?protocols) we used, call the number on your prescription ID card. For more information about your ?prescription benefit, please refer to the prescription benefit section in your benefit plan materials ?Notified Pt via: Mychart ?

## 2021-11-26 ENCOUNTER — Other Ambulatory Visit (HOSPITAL_BASED_OUTPATIENT_CLINIC_OR_DEPARTMENT_OTHER): Payer: Self-pay

## 2021-11-26 MED ORDER — CONTRAVE 8-90 MG PO TB12
ORAL_TABLET | ORAL | 0 refills | Status: DC
  Filled 2021-11-26: qty 120, 30d supply, fill #0

## 2021-11-26 NOTE — Telephone Encounter (Signed)
Rx sent to pharmacy, Loyal Gambler can you initiate a PA for Contrave? ? ?Meds ordered this encounter  ?Medications  ? Naltrexone-buPROPion HCl ER (CONTRAVE) 8-90 MG TB12  ?  Sig: Start 1 tablet every morning for 7 days, then 1 tablet twice daily for 7 days, then 2 tablets every morning and one every evening  ?  Dispense:  120 tablet  ?  Refill:  0  ? ? ?

## 2021-11-27 ENCOUNTER — Telehealth: Payer: Self-pay

## 2021-11-27 NOTE — Telephone Encounter (Addendum)
Initiated Prior authorization LEX:[id number] 8-90MG  er tablets ?Via: Covermymeds ?Case/Key: [id number] ?Status: denied as of 11/27/21 ?Reason:*Drug Not Covered/Plan Exclusion - Your request for coverage was denied because your ?prescription benefit plan does not cover the requested medication. ?This decision relates specifically to coverage provided under your prescription benefit plan and does ?not involve any determination of medical judgment. ?We reviewed all the supporting information sent to [place] and used your plan?s guidelines to make our ?decision. If you?d like [place] to send you a free copy of the guidelines (requirements, criteria, or ?protocols) we used, call the number on your prescription ID card. For more information about your ?prescription benefit, please refer to the prescription benefit section in your benefit plan materials ?Notified Pt via: Mychart ?

## 2021-11-30 ENCOUNTER — Other Ambulatory Visit (HOSPITAL_BASED_OUTPATIENT_CLINIC_OR_DEPARTMENT_OTHER): Payer: Self-pay

## 2021-12-01 ENCOUNTER — Encounter: Payer: Self-pay | Admitting: Physician Assistant

## 2021-12-02 MED ORDER — CONTRAVE 8-90 MG PO TB12: ORAL_TABLET | ORAL | 0 refills | Status: DC

## 2021-12-02 MED ORDER — NALTREXONE-BUPROPION HCL ER 8-90 MG PO TB12: 2.0000 | ORAL_TABLET | Freq: Two times a day (BID) | ORAL | 1 refills | Status: DC

## 2021-12-03 ENCOUNTER — Other Ambulatory Visit (HOSPITAL_BASED_OUTPATIENT_CLINIC_OR_DEPARTMENT_OTHER): Payer: Self-pay

## 2021-12-08 ENCOUNTER — Other Ambulatory Visit (HOSPITAL_COMMUNITY): Payer: Self-pay

## 2021-12-08 ENCOUNTER — Other Ambulatory Visit: Payer: Self-pay | Admitting: Physician Assistant

## 2021-12-08 DIAGNOSIS — F419 Anxiety disorder, unspecified: Secondary | ICD-10-CM

## 2021-12-08 MED ORDER — SERTRALINE HCL 100 MG PO TABS
200.0000 mg | ORAL_TABLET | Freq: Every morning | ORAL | 1 refills | Status: DC
  Filled 2021-12-08: qty 60, 30d supply, fill #0
  Filled 2022-01-15: qty 60, 30d supply, fill #1
  Filled 2022-02-27: qty 60, 30d supply, fill #2
  Filled 2022-03-25: qty 60, 30d supply, fill #3
  Filled 2022-05-18: qty 60, 30d supply, fill #4

## 2021-12-21 ENCOUNTER — Other Ambulatory Visit (HOSPITAL_BASED_OUTPATIENT_CLINIC_OR_DEPARTMENT_OTHER): Payer: Self-pay

## 2021-12-21 ENCOUNTER — Telehealth: Payer: BC Managed Care – PPO | Admitting: Emergency Medicine

## 2021-12-21 DIAGNOSIS — J069 Acute upper respiratory infection, unspecified: Secondary | ICD-10-CM | POA: Diagnosis not present

## 2021-12-21 DIAGNOSIS — J40 Bronchitis, not specified as acute or chronic: Secondary | ICD-10-CM | POA: Diagnosis not present

## 2021-12-21 MED ORDER — FLUTICASONE PROPIONATE 50 MCG/ACT NA SUSP
2.0000 | Freq: Every day | NASAL | 0 refills | Status: DC
  Filled 2021-12-21: qty 16, 30d supply, fill #0

## 2021-12-21 MED ORDER — BENZONATATE 100 MG PO CAPS
100.0000 mg | ORAL_CAPSULE | Freq: Two times a day (BID) | ORAL | 0 refills | Status: DC | PRN
  Filled 2021-12-21: qty 20, 10d supply, fill #0

## 2021-12-21 NOTE — Progress Notes (Signed)
We are sorry that you are not feeling well.  Here is how we plan to help! ? ?Based on your presentation I believe you most likely have A cough due to a virus.  This is called viral bronchitis and is best treated by rest, plenty of fluids and control of the cough.  You may use Ibuprofen or Tylenol as directed to help your symptoms.   ?  ?In addition you may use A prescription cough medication called Tessalon Perles 100mg . You may take 1-2 capsules every 8 hours as needed for your cough. ? ?Because you also have a runny nose and postnasal drip, I have prescribed flonase nasal spray.  ? ?For nasal congestion, continue taking plain Mucinex.  Saline nasal spray or nasal drops can help and can safely be used as often as needed for congestion.   ? ?From your responses in the eVisit questionnaire you describe inflammation in the upper respiratory tract which is causing a significant cough.  This is commonly called Bronchitis and has four common causes:   ?Allergies ?Viral Infections ?Acid Reflux ?Bacterial Infection ?Allergies, viruses and acid reflux are treated by controlling symptoms or eliminating the cause. An example might be a cough caused by taking certain blood pressure medications. You stop the cough by changing the medication. Another example might be a cough caused by acid reflux. Controlling the reflux helps control the cough. ? ?USE OF BRONCHODILATOR ("RESCUE") INHALERS: ?There is a risk from using your bronchodilator too frequently.  The risk is that over-reliance on a medication which only relaxes the muscles surrounding the breathing tubes can reduce the effectiveness of medications prescribed to reduce swelling and congestion of the tubes themselves.  Although you feel brief relief from the bronchodilator inhaler, your asthma may actually be worsening with the tubes becoming more swollen and filled with mucus.  This can delay other crucial treatments, such as oral steroid medications. If you need to use a  bronchodilator inhaler daily, several times per day, you should discuss this with your provider.  There are probably better treatments that could be used to keep your asthma under control.  ?   ?HOME CARE ?Only take medications as instructed by your medical team. ?Complete the entire course of an antibiotic. ?Drink plenty of fluids and get plenty of rest. ?Avoid close contacts especially the very young and the elderly ?Cover your mouth if you cough or cough into your sleeve. ?Always remember to wash your hands ?A steam or ultrasonic humidifier can help congestion.  ? ?GET HELP RIGHT AWAY IF: ?You develop worsening fever. ?You become short of breath ?You cough up blood. ?Your symptoms persist after you have completed your treatment plan ?MAKE SURE YOU  ?Understand these instructions. ?Will watch your condition. ?Will get help right away if you are not doing well or get worse. ?  ? ?Thank you for choosing an e-visit. ? ?Your e-visit answers were reviewed by a board certified advanced clinical practitioner to complete your personal care plan. Depending upon the condition, your plan could have included both over the counter or prescription medications. ? ?Please review your pharmacy choice. Make sure the pharmacy is open so you can pick up prescription now. If there is a problem, you may contact your provider through and have the prescription routed to another pharmacy.  Your safety is important to [employer]. If you have drug allergies check your prescription carefully.  ? ?For the next 24 hours you can use MyChart to ask questions about  today's visit, request a non-urgent call back, or ask for a work or school excuse. ?You will get an email in the next two days asking about your experience. I hope that your e-visit has been valuable and will speed your recovery. ? ?I have spent 5 minutes in review of e-visit questionnaire, review and updating patient chart, medical decision making and response to patient.   ? ?Rica Mast, PhD, FNP-BC ? ? ?

## 2021-12-23 ENCOUNTER — Emergency Department (INDEPENDENT_AMBULATORY_CARE_PROVIDER_SITE_OTHER): Payer: BC Managed Care – PPO

## 2021-12-23 ENCOUNTER — Other Ambulatory Visit: Payer: Self-pay

## 2021-12-23 ENCOUNTER — Emergency Department (INDEPENDENT_AMBULATORY_CARE_PROVIDER_SITE_OTHER)
Admission: RE | Admit: 2021-12-23 | Discharge: 2021-12-23 | Disposition: A | Discharge status: Home / Self Care | Payer: BC Managed Care – PPO | Source: Ambulatory Visit

## 2021-12-23 ENCOUNTER — Other Ambulatory Visit (HOSPITAL_BASED_OUTPATIENT_CLINIC_OR_DEPARTMENT_OTHER): Payer: Self-pay

## 2021-12-23 VITALS — BP 148/103 | HR 110 | Temp 99.1°F | Resp 20 | Ht 67.0 in | Wt 330.0 lb

## 2021-12-23 DIAGNOSIS — R059 Cough, unspecified: Secondary | ICD-10-CM

## 2021-12-23 DIAGNOSIS — J309 Allergic rhinitis, unspecified: Secondary | ICD-10-CM

## 2021-12-23 DIAGNOSIS — J0100 Acute maxillary sinusitis, unspecified: Secondary | ICD-10-CM

## 2021-12-23 MED ORDER — PROMETHAZINE-DM 6.25-15 MG/5ML PO SYRP
5.0000 mL | ORAL_SOLUTION | Freq: Two times a day (BID) | ORAL | 0 refills | Status: DC | PRN
  Filled 2021-12-23: qty 118, 12d supply, fill #0

## 2021-12-23 MED ORDER — FEXOFENADINE HCL 180 MG PO TABS
180.0000 mg | ORAL_TABLET | Freq: Every day | ORAL | 0 refills | Status: DC
  Filled 2021-12-23: qty 30, 30d supply, fill #0

## 2021-12-23 MED ORDER — AMOXICILLIN-POT CLAVULANATE 875-125 MG PO TABS
1.0000 | ORAL_TABLET | Freq: Two times a day (BID) | ORAL | 0 refills | Status: AC
  Filled 2021-12-23: qty 14, 7d supply, fill #0

## 2021-12-23 MED ORDER — PREDNISONE 20 MG PO TABS
ORAL_TABLET | ORAL | 0 refills | Status: DC
  Filled 2021-12-23: qty 15, 5d supply, fill #0

## 2021-12-23 NOTE — ED Provider Notes (Signed)
?[facility] ? ? ? ?CSN: [id number] ?Arrival date & time: 12/23/21  1529 ? ? ?  ? ?History   ?Chief Complaint ?Chief Complaint  ?Patient presents with  ? Wheezing  ?  Entered by patient  ? Cough  ? ? ?HPI ?Emma Boyer is a 26 y.o. female.  ? ?HPI 26 year old female presents with cough and wheezing for 1 week.  Reports using albuterol as needed.  Reports E-visit with PCP on Monday, 12/21/2021 was told she has URI/bronchitis and was prescribed Tessalon Perles and Flonase.  PMH significant for morbid obesity, ADHD, and migraine headache. ? ?Past Medical History:  ?Diagnosis Date  ? Bulging lumbar disc 2014  ? HA (headache)   ? Low back pain   ? Neck pain   ? Pneumonia   ? Pseudopapilledema   ? Shoulder pain   ? Vision abnormalities   ? wears contacts, right eye worse  ? ? ?Patient Active Problem List  ? Diagnosis Date Noted  ? Attention deficit hyperactivity disorder (ADHD), combined type 11/22/2021  ? Strain of lumbar region 11/19/2021  ? Non-intractable vomiting 04/15/2021  ? Class 3 severe obesity due to excess calories without serious comorbidity with body mass index (BMI) of 50.0 to 59.9 in adult [facility](HCC) 04/15/2021  ? Gastroesophageal reflux disease 04/15/2021  ? Acute suppurative inflammation of sphenoidal sinus 10/13/2020  ? Empty sella turcica (HCC) 10/13/2020  ? Benign paroxysmal positional vertigo, right 10/13/2020  ? Insomnia 10/13/2020  ? Anxiety 10/13/2020  ? H/O degenerative disc disease 02/07/2019  ? 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 Hemoptysis 09/07/2016  ? De Quervain's tenosynovitis, bilateral 11/21/2015  ? History of migraine headaches 08/29/2015  ? Abnormal weight gain 01/27/2015  ? Migraine headache 01/27/2015  ? Pseudopapilledema   ? Shoulder pain   ? 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  ? Difficulty concentrating 09/08/2012  ? ? ?Past Surgical History:  ?Procedure Laterality Date  ? None    ? SUBOCCIPITAL CRANIECTOMY CERVICAL LAMINECTOMY N/A 05/01/2013  ?  Procedure: Chiari Decompression;  Surgeon: Maeola HarmanJoseph Stern, MD;  Location: [facility];  Service: Neurosurgery;  Laterality: N/A;  Chiari Decompression  ? ? ?OB History   ?No obstetric history on file. ?  ? ? ? ?Home Medications   ? ?Prior to Admission medications   ?Medication Sig Start Date End Date Taking? Authorizing Provider  ?albuterol (VENTOLIN HFA) 108 (90 Base) MCG/ACT inhaler Inhale 2 puffs into the lungs every 6 (six) hours as needed for wheezing or shortness of breath. 05/23/21  Yes Claiborne RiggFleming, Zelda W, NP  ?amoxicillin-clavulanate (AUGMENTIN) 875-125 MG tablet Take 1 tablet by mouth 2 (two) times daily for 7 days. 12/23/21 12/30/21 Yes Trevor Ihaagan, Selso Mannor, FNP  ?baclofen (LIORESAL) 10 MG tablet Take 1 tablet (10 mg total) by mouth 3 (three) times daily. 11/02/21  Yes Trevor Ihaagan, Ericca Labra, FNP  ?benzonatate (TESSALON) 100 MG capsule Take 1 capsule (100 mg total) by mouth 2 (two) times daily as needed for cough. 12/21/21  Yes Cathlyn ParsonsKabbe, Angela M, NP  ?diazepam (VALIUM) 2 MG tablet Take 1 tablet (2 mg total) by mouth every 12 (twelve) hours as needed for anxiety. 10/13/20  Yes Breeback, Jade L, PA-C  ?fexofenadine (ALLEGRA ALLERGY) 180 MG tablet Take 1 tablet (180 mg total) by mouth daily for 15 days. 12/23/21 01/07/22 Yes Trevor Ihaagan, Keanan Melander, FNP  ?fluticasone (FLONASE) 50 MCG/ACT nasal spray Place 2 sprays into both nostrils daily. 12/21/21  Yes Cathlyn ParsonsKabbe, Angela M, NP  ?methylphenidate (METADATE ER) 20 MG ER tablet  TAKE 1 TABLET BY MOUTH DAILY 11/19/21 05/18/22 Yes Breeback, Jade L, PA-C  ?metoCLOPramide (REGLAN) 10 MG tablet Take 1 tablet by mouth 3 times a day as needed for nausea 04/29/21  Yes Breeback, Jade L, PA-C  ?Omega-3 Fatty Acids (FISH OIL PO) Take 1 capsule by mouth daily with breakfast.   Yes [provider]  ?omeprazole (PRILOSEC) 40 MG capsule Take 1 capsule (40 mg total) by mouth daily. 09/17/21  Yes Breeback, Jade L, PA-C  ?predniSONE (DELTASONE) 20 MG tablet Take 3 tabs PO daily x 5 days. 12/23/21  Yes Trevor Iha, FNP   ?promethazine-dextromethorphan (PROMETHAZINE-DM) 6.25-15 MG/5ML syrup Take 5 mLs by mouth 2 (two) times daily as needed for cough. 12/23/21  Yes Trevor Iha, FNP  ?propranolol (INDERAL) 10 MG tablet Take 10 mg by mouth 3 (three) times daily as needed (for anxiety). 07/14/20  Yes [provider]  ?sertraline (ZOLOFT) 100 MG tablet Take 2 tablets (200 mg total) by mouth in the morning. 12/08/21  Yes Breeback, Jade L, PA-C  ?traZODone (DESYREL) 50 MG tablet Take 1/2 - 2 tablets by mouth at bedtime as needed for sleep. 11/19/21  Yes Breeback, Jade L, PA-C  ? ? ?Family History ?Family History  ?Problem Relation Age of Onset  ? High blood pressure Father   ? Arthritis Father   ? Anxiety disorder Mother   ? Depression Mother   ? Anxiety disorder Sister   ? Depression Sister   ? Physical abuse Sister   ? Sexual abuse Sister   ? ADD / ADHD Maternal Aunt   ? Anxiety disorder Maternal Aunt   ? Depression Maternal Aunt   ? Anxiety disorder Maternal Grandfather   ? Dementia Maternal Grandfather   ? Depression Maternal Grandfather   ? ? ?Social History ?Social History  ? ?Tobacco Use  ? Smoking status: Former  ?  Packs/day: 0.10  ?  Years: 7.00  ?  Pack years: 0.70  ?  Types: Cigarettes  ?  Quit date: 03/18/2017  ?  Years since quitting: 4.7  ? Smokeless tobacco: Never  ? Tobacco comments:  ?  Smokes about 2 per day.  ?Vaping Use  ? Vaping Use: Former  ?Substance Use Topics  ? Alcohol use: Yes  ?  Alcohol/week: 1.0 standard drink  ?  Types: 1 Glasses of wine per week  ?  Comment: Occasionally  ? Drug use: No  ? ? ? ?Allergies   ?Peanut-containing drug products ? ? ?Review of Systems ?Review of Systems  ?HENT:  Positive for congestion and postnasal drip.   ?Respiratory:  Positive for choking.   ?All other systems reviewed and are negative. ? ? ?Physical Exam ?Triage Vital Signs ?ED Triage Vitals  ?Enc Vitals Group  ?   BP --   ?   Pulse --   ?   Resp --   ?   Temp --   ?   Temp src --   ?   SpO2 --   ?   Weight 12/23/21  1542 (!) 330 lb (149.7 kg)  ?   Height 12/23/21 1542 5\' 7"  (1.702 m)  ?   Head Circumference --   ?   Peak Flow --   ?   Pain Score 12/23/21 1541 0  ?   Pain Loc --   ?   Pain Edu? --   ?   Excl. in GC? --   ? ?No data found. ? ?Updated Vital Signs ?BP (!) 148/103 (BP  Location: Left Arm)   Pulse (!) 110   Temp 99.1 ?F (37.3 ?C) (Oral)   Resp 20   Ht 5\' 7"  (1.702 m)   Wt (!) 330 lb (149.7 kg)   LMP 11/18/2021   SpO2 98%   BMI 51.69 kg/m?  ? ?   ? ?Physical Exam ?Vitals and nursing note reviewed.  ?Constitutional:   ?   General: She is not in acute distress. ?   Appearance: She is obese. She is not ill-appearing.  ?HENT:  ?   Head: Normocephalic and atraumatic.  ?   Right Ear: Tympanic membrane and external ear normal.  ?   Left Ear: Tympanic membrane and external ear normal.  ?   Ears:  ?   Comments: Moderate eustachian tube dysfunction noted bilaterally ?   Nose:  ?   Comments: Turbinates are erythematous/edematous ?   Mouth/Throat:  ?   Mouth: Mucous membranes are moist.  ?   Pharynx: Oropharynx is clear.  ?   Comments: Moderate amount of clear drainage of posterior oropharynx noted ?Cardiovascular:  ?   Rate and Rhythm: Normal rate and regular rhythm.  ?   Pulses: Normal pulses.  ?   Heart sounds: Normal heart sounds. No murmur heard. ?Pulmonary:  ?   Effort: Pulmonary effort is normal.  ?   Breath sounds: Normal breath sounds. No wheezing, rhonchi or rales.  ?   Comments: Infrequent nonproductive cough noted on exam ?Musculoskeletal:  ?   Cervical back: Normal range of motion and neck supple.  ?Skin: ?   General: Skin is warm and dry.  ?Neurological:  ?   General: No focal deficit present.  ?   Mental Status: She is oriented to person, place, and time. Mental status is at baseline.  ? ? ? ?UC Treatments / Results  ?Labs ?(all labs ordered are listed, but only abnormal results are displayed) ?Labs Reviewed - No data to display ? ?EKG ? ? ?Radiology ?DG Chest 2 View ? ?Result Date: 12/23/2021 ?CLINICAL DATA:   Cough. EXAM: CHEST - 2 VIEW COMPARISON:  Chest x-ray February 24, 22. FINDINGS: No consolidation. No visible pleural effusions or pneumothorax. Cardiomediastinal silhouette is within normal limits. No displaced

## 2021-12-23 NOTE — Discharge Instructions (Addendum)
Instructed patient to take medication as directed with food to completion.  Advised patient to take prednisone and Allegra with first dose of Augmentin for the next 5 of 7 days.  Advised may use Allegra as needed afterwards for concurrent postnasal drainage/drip.  Advised may use Promethazine DM daily or as needed for cough.  Patient cautioned about sedative effects of this cough medication.  Encouraged patient to increase daily water intake while taking these medications.  Advised if symptoms worsen and/or unresolved please follow-up with PCP or here for further evaluation. ?

## 2021-12-23 NOTE — ED Triage Notes (Addendum)
Pt c/o cough and wheezing x 1wk. Denies Fever. Using Albuterol inhaler. She did an E-visit 2 days ago. Taking Sudafed, Mucinex and Tessalon Perles without relief.Reports negative home Covid test. ?

## 2021-12-24 ENCOUNTER — Other Ambulatory Visit (HOSPITAL_BASED_OUTPATIENT_CLINIC_OR_DEPARTMENT_OTHER): Payer: Self-pay

## 2021-12-25 ENCOUNTER — Other Ambulatory Visit (HOSPITAL_BASED_OUTPATIENT_CLINIC_OR_DEPARTMENT_OTHER): Payer: Self-pay

## 2021-12-29 ENCOUNTER — Other Ambulatory Visit (HOSPITAL_BASED_OUTPATIENT_CLINIC_OR_DEPARTMENT_OTHER): Payer: Self-pay

## 2022-01-01 ENCOUNTER — Other Ambulatory Visit: Payer: Self-pay

## 2022-01-15 ENCOUNTER — Other Ambulatory Visit: Payer: Self-pay | Admitting: Physician Assistant

## 2022-01-15 ENCOUNTER — Other Ambulatory Visit (HOSPITAL_COMMUNITY): Payer: Self-pay

## 2022-01-15 DIAGNOSIS — G479 Sleep disorder, unspecified: Secondary | ICD-10-CM

## 2022-01-15 DIAGNOSIS — F419 Anxiety disorder, unspecified: Secondary | ICD-10-CM

## 2022-01-15 MED ORDER — DIAZEPAM 2 MG PO TABS
2.0000 mg | ORAL_TABLET | Freq: Two times a day (BID) | ORAL | 0 refills | Status: DC | PRN
  Filled 2022-01-15: qty 20, 10d supply, fill #0

## 2022-02-01 ENCOUNTER — Other Ambulatory Visit (HOSPITAL_COMMUNITY): Payer: Self-pay

## 2022-02-27 ENCOUNTER — Other Ambulatory Visit: Payer: Self-pay | Admitting: Physician Assistant

## 2022-02-27 DIAGNOSIS — K219 Gastro-esophageal reflux disease without esophagitis: Secondary | ICD-10-CM

## 2022-02-27 DIAGNOSIS — R112 Nausea with vomiting, unspecified: Secondary | ICD-10-CM

## 2022-03-01 ENCOUNTER — Other Ambulatory Visit (HOSPITAL_COMMUNITY): Payer: Self-pay

## 2022-03-01 MED ORDER — OMEPRAZOLE 40 MG PO CPDR
40.0000 mg | DELAYED_RELEASE_CAPSULE | Freq: Every day | ORAL | 3 refills | Status: DC
  Filled 2022-03-01: qty 30, 30d supply, fill #0
  Filled 2022-03-25: qty 30, 30d supply, fill #1
  Filled 2022-05-18: qty 30, 30d supply, fill #2
  Filled 2022-07-15: qty 30, 30d supply, fill #3

## 2022-03-06 ENCOUNTER — Other Ambulatory Visit: Payer: Self-pay | Admitting: Physician Assistant

## 2022-03-06 DIAGNOSIS — G4700 Insomnia, unspecified: Secondary | ICD-10-CM

## 2022-03-08 ENCOUNTER — Other Ambulatory Visit (HOSPITAL_COMMUNITY): Payer: Self-pay

## 2022-03-08 MED ORDER — TRAZODONE HCL 50 MG PO TABS
25.0000 mg | ORAL_TABLET | Freq: Every evening | ORAL | 0 refills | Status: DC | PRN
  Filled 2022-03-08: qty 60, 30d supply, fill #0
  Filled 2022-04-08: qty 60, 30d supply, fill #1
  Filled 2022-05-09: qty 60, 30d supply, fill #2

## 2022-03-17 ENCOUNTER — Encounter: Payer: Self-pay | Admitting: Neurology

## 2022-03-25 ENCOUNTER — Other Ambulatory Visit (HOSPITAL_BASED_OUTPATIENT_CLINIC_OR_DEPARTMENT_OTHER): Payer: Self-pay

## 2022-03-25 ENCOUNTER — Other Ambulatory Visit (HOSPITAL_COMMUNITY): Payer: Self-pay

## 2022-03-25 ENCOUNTER — Telehealth: Payer: BC Managed Care – PPO | Admitting: Physician Assistant

## 2022-03-25 DIAGNOSIS — Z8719 Personal history of other diseases of the digestive system: Secondary | ICD-10-CM | POA: Diagnosis not present

## 2022-03-25 MED ORDER — DILTIAZEM GEL 2 %: 1.0000 | Freq: Three times a day (TID) | CUTANEOUS | 0 refills | Status: DC

## 2022-03-25 MED ORDER — DILTIAZEM GEL 2 %
1.0000 | Freq: Three times a day (TID) | CUTANEOUS | 0 refills | Status: DC
  Filled 2022-03-25: qty 15, 5d supply, fill #0

## 2022-03-25 NOTE — Progress Notes (Signed)
E-Visit for Hemorrhoid  We are sorry that you are not feeling well. We are here to help!  Based on your history and current symptoms, I would agree there is concern for a fissure. I will start a cream to help with this. Follow recommendations below. If bleeding does not calm down or any other new/worsening symptoms occur, you need to seek an in-person evaluation ASAP   What can I do to keep from getting more hemorrhoids? -- The most important thing you can do is to keep from getting constipated. You should have a bowel movement at least a few times a week. When you have a bowel movement, you also should not have to push too much. Plus, your bowel movements should not be too hard. Being constipated and having hard bowel movements can make hemorrhoids worse.   I have prescribed a topical diltiazem gel to use three times daily for up to 4 weeks. If no substantial improvement in the next week or continued bleeding, you need an in-person evaluation.   HOME CARE: Sitz Baths twice daily. Soak buttocks in 2 or 3 inches of warm water for 10 to 15 minutes. Do not add soap, bubble bath, or anything to the water. Stool softener such as Colace 100 mg twice daily AND Miralax 1 scoop daily until you have regular soft stools Over the counter Preparation H Tucks Pads Witch Hazel  Here are some steps you can take to avoid getting constipated or having hard stools:  ?Eat lots of fruits, vegetables, and other foods with fiber. Fiber helps to increase bowel movements. If you do not get enough fiber from your diet, you can take fiber supplements. These come in the form of powders, wafers, or pills. Some examples are Metamucil, Citrucel, Benefiber and FiberCon. If you take a fiber supplement, be sure to read the label so you know how much to take. If you're not sure, ask your provider or nurse. ?Take medicines called "stool softeners" such as docusate sodium (sample brand names: Colace, Dulcolax). These medicines  increase the number of bowel movements you have. They are safe to take and they can prevent problems later.  You should request a referral for a follow up evaluation with a Gastroenterologist (GI doctor) to evaluate this chronic and relapsing condition - even if it improves to see what further steps need to be taken. This is highly linked to chronic constipation and straining to have a bowel movement. It may require further treatment or surgical intervention.   GET HELP RIGHT AWAY IF: You develop severe pain You have heavy bleeding   FOLLOW UP WITH YOUR PRIMARY PROVIDER IF: If your symptoms do not improve within 10 days  MAKE SURE YOU  Understand these instructions. Will watch your condition. Will get help right away if you are not doing well or get worse.   Thank you for choosing an e-visit.  Your e-visit answers were reviewed by a board certified advanced clinical practitioner to complete your personal care plan. Depending upon the condition, your plan could have included both over the counter or prescription medications.  Please review your pharmacy choice. Make sure the pharmacy is open so you can pick up prescription now. If there is a problem, you may contact your provider through [employer] and have the prescription routed to another pharmacy.  Your safety is important to [place]. If you have drug allergies check your prescription carefully.   For the next 24 hours you can use MyChart to ask questions  about today's visit, request a non-urgent call back, or ask for a work or school excuse. You will get an email in the next two days asking about your experience. I hope that your e-visit has been valuable and will speed your recovery.

## 2022-03-25 NOTE — Addendum Note (Signed)
Addended by: Waldon Merl on: 03/25/2022 04:38 PM   Modules accepted: Orders

## 2022-03-25 NOTE — Progress Notes (Signed)
I have spent 5 minutes in review of e-visit questionnaire, review and updating patient chart, medical decision making and response to patient.   Brennley Curtice Cody Natayla Cadenhead, PA-C    

## 2022-03-26 ENCOUNTER — Other Ambulatory Visit (HOSPITAL_COMMUNITY): Payer: Self-pay

## 2022-04-01 ENCOUNTER — Other Ambulatory Visit: Payer: Self-pay | Admitting: Physician Assistant

## 2022-04-01 DIAGNOSIS — R41840 Attention and concentration deficit: Secondary | ICD-10-CM

## 2022-04-01 NOTE — Telephone Encounter (Signed)
Last appt 11/19/2021 Last written 11/19/2021 #90 no refills

## 2022-04-02 ENCOUNTER — Other Ambulatory Visit (HOSPITAL_COMMUNITY): Payer: Self-pay

## 2022-04-02 MED ORDER — METHYLPHENIDATE HCL ER 20 MG PO TBCR
20.0000 mg | EXTENDED_RELEASE_TABLET | Freq: Every day | ORAL | 0 refills | Status: DC
  Filled 2022-04-02: qty 30, 30d supply, fill #0

## 2022-04-08 ENCOUNTER — Other Ambulatory Visit (HOSPITAL_COMMUNITY): Payer: Self-pay

## 2022-04-09 ENCOUNTER — Other Ambulatory Visit (HOSPITAL_COMMUNITY): Payer: Self-pay

## 2022-04-20 ENCOUNTER — Other Ambulatory Visit (HOSPITAL_COMMUNITY): Payer: Self-pay

## 2022-05-10 ENCOUNTER — Other Ambulatory Visit (HOSPITAL_COMMUNITY): Payer: Self-pay

## 2022-05-10 ENCOUNTER — Encounter: Payer: Self-pay | Admitting: Physician Assistant

## 2022-05-17 ENCOUNTER — Telehealth: Payer: BC Managed Care – PPO | Admitting: Physician Assistant

## 2022-05-17 ENCOUNTER — Other Ambulatory Visit (HOSPITAL_BASED_OUTPATIENT_CLINIC_OR_DEPARTMENT_OTHER): Payer: Self-pay

## 2022-05-17 DIAGNOSIS — R053 Chronic cough: Secondary | ICD-10-CM

## 2022-05-17 MED ORDER — AZITHROMYCIN 250 MG PO TABS
ORAL_TABLET | ORAL | 0 refills | Status: AC
  Filled 2022-05-17: qty 6, 5d supply, fill #0

## 2022-05-17 MED ORDER — PREDNISONE 10 MG PO TABS
ORAL_TABLET | ORAL | 0 refills | Status: AC
  Filled 2022-05-17: qty 21, 6d supply, fill #0

## 2022-05-17 NOTE — Progress Notes (Signed)
We are sorry that you are not feeling well.  Here is how we plan to help!  Based on your presentation I believe you most likely have A cough due to bacteria.  When patients have a fever and a productive cough with a change in color or increased sputum production, we are concerned about bacterial bronchitis.  If left untreated it can progress to pneumonia.  If your symptoms do not improve with your treatment plan it is important that you contact your provider.   I have prescribed Azithromyin 250 mg: two tablets now and then one tablet daily for 4 additonal days    In addition you may use A non-prescription cough medication called Mucinex DM: take 2 tablets every 12 hours.  Prednisone 10 mg daily for 6 days (see taper instructions below)  Directions for 6 day taper: Day 1: 2 tablets before breakfast, 1 after both lunch & dinner and 2 at bedtime Day 2: 1 tab before breakfast, 1 after both lunch & dinner and 2 at bedtime Day 3: 1 tab at each meal & 1 at bedtime Day 4: 1 tab at breakfast, 1 at lunch, 1 at bedtime Day 5: 1 tab at breakfast & 1 tab at bedtime Day 6: 1 tab at breakfast  From your responses in the eVisit questionnaire you describe inflammation in the upper respiratory tract which is causing a significant cough.  This is commonly called Bronchitis and has four common causes:   Allergies Viral Infections Acid Reflux Bacterial Infection Allergies, viruses and acid reflux are treated by controlling symptoms or eliminating the cause. An example might be a cough caused by taking certain blood pressure medications. You stop the cough by changing the medication. Another example might be a cough caused by acid reflux. Controlling the reflux helps control the cough.  USE OF BRONCHODILATOR ("RESCUE") INHALERS: There is a risk from using your bronchodilator too frequently.  The risk is that over-reliance on a medication which only relaxes the muscles surrounding the breathing tubes can reduce the  effectiveness of medications prescribed to reduce swelling and congestion of the tubes themselves.  Although you feel brief relief from the bronchodilator inhaler, your asthma may actually be worsening with the tubes becoming more swollen and filled with mucus.  This can delay other crucial treatments, such as oral steroid medications. If you need to use a bronchodilator inhaler daily, several times per day, you should discuss this with your provider.  There are probably better treatments that could be used to keep your asthma under control.     HOME CARE Only take medications as instructed by your medical team. Complete the entire course of an antibiotic. Drink plenty of fluids and get plenty of rest. Avoid close contacts especially the very young and the elderly Cover your mouth if you cough or cough into your sleeve. Always remember to wash your hands A steam or ultrasonic humidifier can help congestion.   GET HELP RIGHT AWAY IF: You develop worsening fever. You become short of breath You cough up blood. Your symptoms persist after you have completed your treatment plan MAKE SURE YOU  Understand these instructions. Will watch your condition. Will get help right away if you are not doing well or get worse.    Thank you for choosing an e-visit.  Your e-visit answers were reviewed by a board certified advanced clinical practitioner to complete your personal care plan. Depending upon the condition, your plan could have included both over the counter or prescription   medications.  Please review your pharmacy choice. Make sure the pharmacy is open so you can pick up prescription now. If there is a problem, you may contact your provider through [employer] and have the prescription routed to another pharmacy.  Your safety is important to [place]. If you have drug allergies check your prescription carefully.   For the next 24 hours you can use MyChart to ask questions about today's visit,  request a non-urgent call back, or ask for a work or school excuse. You will get an email in the next two days asking about your experience. I hope that your e-visit has been valuable and will speed your recovery.  I provided 5 minutes of non face-to-face time during this encounter for chart review and documentation.

## 2022-05-18 ENCOUNTER — Other Ambulatory Visit: Payer: Self-pay | Admitting: Physician Assistant

## 2022-05-18 DIAGNOSIS — R41840 Attention and concentration deficit: Secondary | ICD-10-CM

## 2022-05-19 ENCOUNTER — Other Ambulatory Visit (HOSPITAL_COMMUNITY): Payer: Self-pay

## 2022-05-19 NOTE — Telephone Encounter (Signed)
Was told in August she needed an appt for refills, has still not made appt. Please sign denial.

## 2022-05-21 ENCOUNTER — Other Ambulatory Visit: Payer: Self-pay | Admitting: Physician Assistant

## 2022-05-21 DIAGNOSIS — R41840 Attention and concentration deficit: Secondary | ICD-10-CM

## 2022-05-21 NOTE — Telephone Encounter (Signed)
Already refused once, please sign refusal again.

## 2022-05-26 ENCOUNTER — Other Ambulatory Visit: Payer: Self-pay | Admitting: Physician Assistant

## 2022-05-26 DIAGNOSIS — R41840 Attention and concentration deficit: Secondary | ICD-10-CM

## 2022-05-27 ENCOUNTER — Other Ambulatory Visit (HOSPITAL_COMMUNITY): Payer: Self-pay

## 2022-05-27 ENCOUNTER — Other Ambulatory Visit: Payer: Self-pay | Admitting: Physician Assistant

## 2022-05-27 DIAGNOSIS — R41840 Attention and concentration deficit: Secondary | ICD-10-CM

## 2022-05-27 NOTE — Telephone Encounter (Signed)
Refill request for methylphenidate -  Last written 04/02/22  Last visit 11/19/2021 No scheduled upcoming appt   Med already been denied twice  Needs an appointment   Will resign denial ?

## 2022-05-31 ENCOUNTER — Other Ambulatory Visit (HOSPITAL_BASED_OUTPATIENT_CLINIC_OR_DEPARTMENT_OTHER): Payer: Self-pay

## 2022-05-31 ENCOUNTER — Telehealth: Payer: BC Managed Care – PPO | Admitting: Emergency Medicine

## 2022-05-31 DIAGNOSIS — R21 Rash and other nonspecific skin eruption: Secondary | ICD-10-CM

## 2022-05-31 MED ORDER — DOXYCYCLINE HYCLATE 100 MG PO CAPS
100.0000 mg | ORAL_CAPSULE | Freq: Two times a day (BID) | ORAL | 0 refills | Status: DC
  Filled 2022-05-31: qty 20, 10d supply, fill #0

## 2022-05-31 NOTE — Progress Notes (Signed)
E Visit for Rash  We are sorry that you are not feeling well. Here is how we plan to help!  I agree, this has some characteristics of acne.  Because you said you are not pregnant or breastfeeding, I recommend the following treatment.  Doxycycline 100 mg twice per day for 7 days   If this doesn't work, or if your symptoms worsen, you should be seen in person.  Don't take if pregnant or breastfeeding.    HOME CARE:  Take cool showers and avoid direct sunlight. Apply cool compress or wet dressings. Take a bath in an oatmeal bath.  Sprinkle content of one Aveeno packet under running faucet with comfortably warm water.  Bathe for 15-20 minutes, 1-2 times daily.  Pat dry with a towel. Do not rub the rash. Use hydrocortisone cream. Take an antihistamine like Benadryl for widespread rashes that itch.  The adult dose of Benadryl is 25-50 mg by mouth 4 times daily. Caution:  This type of medication may cause sleepiness.  Do not drink alcohol, drive, or operate dangerous machinery while taking antihistamines.  Do not take these medications if you have prostate enlargement.  Read package instructions thoroughly on all medications that you take.  GET HELP RIGHT AWAY IF:  Symptoms don't go away after treatment. Severe itching that persists. If you rash spreads or swells. If you rash begins to smell. If it blisters and opens or develops a yellow-brown crust. You develop a fever. You have a sore throat. You become short of breath.  MAKE SURE YOU:  Understand these instructions. Will watch your condition. Will get help right away if you are not doing well or get worse.  Thank you for choosing an e-visit.  Your e-visit answers were reviewed by a board certified advanced clinical practitioner to complete your personal care plan. Depending upon the condition, your plan could have included both over the counter or prescription medications.  Please review your pharmacy choice. Make sure the  pharmacy is open so you can pick up prescription now. If there is a problem, you may contact your provider through [employer] and have the prescription routed to another pharmacy.  Your safety is important to [place]. If you have drug allergies check your prescription carefully.   For the next 24 hours you can use MyChart to ask questions about today's visit, request a non-urgent call back, or ask for a work or school excuse. You will get an email in the next two days asking about your experience. I hope that your e-visit has been valuable and will speed your recovery.  Approximately 5 minutes was used in reviewing the patient's chart, questionnaire, prescribing medications, and documentation.

## 2022-06-09 ENCOUNTER — Other Ambulatory Visit (HOSPITAL_BASED_OUTPATIENT_CLINIC_OR_DEPARTMENT_OTHER): Payer: Self-pay

## 2022-06-09 ENCOUNTER — Telehealth (INDEPENDENT_AMBULATORY_CARE_PROVIDER_SITE_OTHER): Payer: BC Managed Care – PPO | Admitting: Physician Assistant

## 2022-06-09 VITALS — Ht 67.0 in | Wt 330.0 lb

## 2022-06-09 DIAGNOSIS — F419 Anxiety disorder, unspecified: Secondary | ICD-10-CM | POA: Diagnosis not present

## 2022-06-09 DIAGNOSIS — J069 Acute upper respiratory infection, unspecified: Secondary | ICD-10-CM

## 2022-06-09 DIAGNOSIS — S39012D Strain of muscle, fascia and tendon of lower back, subsequent encounter: Secondary | ICD-10-CM

## 2022-06-09 DIAGNOSIS — F902 Attention-deficit hyperactivity disorder, combined type: Secondary | ICD-10-CM

## 2022-06-09 DIAGNOSIS — Z6841 Body Mass Index (BMI) 40.0 and over, adult: Secondary | ICD-10-CM

## 2022-06-09 DIAGNOSIS — G479 Sleep disorder, unspecified: Secondary | ICD-10-CM

## 2022-06-09 DIAGNOSIS — Z79899 Other long term (current) drug therapy: Secondary | ICD-10-CM

## 2022-06-09 DIAGNOSIS — G4700 Insomnia, unspecified: Secondary | ICD-10-CM

## 2022-06-09 DIAGNOSIS — R41840 Attention and concentration deficit: Secondary | ICD-10-CM

## 2022-06-09 MED ORDER — FLUTICASONE PROPIONATE 50 MCG/ACT NA SUSP
2.0000 | Freq: Every day | NASAL | 3 refills | Status: DC
  Filled 2022-06-09: qty 16, 25d supply, fill #0

## 2022-06-09 MED ORDER — TRAZODONE HCL 50 MG PO TABS
25.0000 mg | ORAL_TABLET | Freq: Every evening | ORAL | 1 refills | Status: DC | PRN
  Filled 2022-06-09: qty 60, 30d supply, fill #0
  Filled 2022-07-10: qty 60, 30d supply, fill #1
  Filled 2022-08-08: qty 60, 30d supply, fill #2
  Filled 2022-09-08: qty 60, 30d supply, fill #3
  Filled 2022-10-07: qty 60, 30d supply, fill #4
  Filled 2022-11-09: qty 60, 30d supply, fill #5

## 2022-06-09 MED ORDER — METHYLPHENIDATE HCL ER 20 MG PO TBCR
20.0000 mg | EXTENDED_RELEASE_TABLET | Freq: Every day | ORAL | 0 refills | Status: DC
  Filled 2022-07-15: qty 30, 30d supply, fill #0

## 2022-06-09 MED ORDER — DIAZEPAM 2 MG PO TABS
2.0000 mg | ORAL_TABLET | Freq: Two times a day (BID) | ORAL | 0 refills | Status: DC | PRN
  Filled 2022-06-09: qty 20, 10d supply, fill #0

## 2022-06-09 MED ORDER — METHYLPHENIDATE HCL ER 20 MG PO TBCR
20.0000 mg | EXTENDED_RELEASE_TABLET | Freq: Every day | ORAL | 0 refills | Status: DC
  Filled 2022-08-08 – 2022-09-01 (×3): qty 30, 30d supply, fill #0

## 2022-06-09 MED ORDER — TIZANIDINE HCL 6 MG PO CAPS
6.0000 mg | ORAL_CAPSULE | Freq: Three times a day (TID) | ORAL | 1 refills | Status: DC | PRN
  Filled 2022-06-09: qty 90, 30d supply, fill #0

## 2022-06-09 MED ORDER — SERTRALINE HCL 100 MG PO TABS
200.0000 mg | ORAL_TABLET | Freq: Every morning | ORAL | 1 refills | Status: DC
  Filled 2022-06-09: qty 60, 30d supply, fill #0
  Filled 2022-07-15: qty 60, 30d supply, fill #1
  Filled 2022-09-08: qty 60, 30d supply, fill #2
  Filled 2022-10-07: qty 60, 30d supply, fill #3
  Filled 2022-11-28: qty 60, 30d supply, fill #4

## 2022-06-09 MED ORDER — METHYLPHENIDATE HCL ER 20 MG PO TBCR
20.0000 mg | EXTENDED_RELEASE_TABLET | Freq: Every day | ORAL | 0 refills | Status: DC
  Filled 2022-06-09: qty 30, 30d supply, fill #0

## 2022-06-09 MED ORDER — FEXOFENADINE HCL 180 MG PO TABS
180.0000 mg | ORAL_TABLET | Freq: Every day | ORAL | 3 refills | Status: DC
  Filled 2022-06-09: qty 30, 30d supply, fill #0

## 2022-06-09 NOTE — Progress Notes (Signed)
PHQ9 (6) -GAD7 (5) completed.   Needs refills for most medications

## 2022-06-09 NOTE — Progress Notes (Signed)
..Virtual Visit via Video Note  I connected with Emma Boyer on 06/11/22 at 10:50 AM EDT by a video enabled telemedicine application and verified that I am speaking with the correct person using two identifiers.  Location: Patient: home Provider: work  .Marland KitchenParticipating in visit:  Patient: Emma Boyer Provider: Iran Planas PA-C   I discussed the limitations of evaluation and management by telemedicine and the availability of in person appointments. The patient expressed understanding and agreed to proceed.  History of Present Illness: Pt is a 26 yo female with ADHD, chronic low back pain, insomnia who needs refills.   Overall patient is doing well. Needs concerta refilled. No increase in anxiety. Needs trazodone refilled.   Muscle relaxers really help her low back pain when she goes through flares.   .. 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   H/O degenerative disc disease 02/07/2019   Acute suppurative inflammation of sphenoidal sinus 10/13/2020   Empty sella turcica ([place]) 10/13/2020   Benign paroxysmal positional vertigo, right 10/13/2020   Insomnia 10/13/2020   Anxiety 10/13/2020   Non-intractable vomiting 04/15/2021   Class 3 severe obesity due to excess calories without serious comorbidity with body mass index (BMI) of 50.0 to 59.9 in adult ([place]) 04/15/2021   Gastroesophageal reflux disease 04/15/2021   Strain of lumbar region 11/19/2021   Attention deficit hyperactivity disorder (ADHD), combined type 11/22/2021   Resolved Ambulatory Problems    Diagnosis Date Noted   OTITIS MEDIA, SEROUS, ACUTE,  LEFT 05/18/2009   Acute pharyngitis 04/28/2010   URI 05/18/2009   Preventive measure 09/08/2012   Low back pain 04/20/2013   Cerumen impaction 01/14/2014   Influenza A 09/23/2015   Viral syndrome 10/06/2018   Past Medical History:  Diagnosis Date   Bulging lumbar disc 2014   HA (headache)    Neck pain    Pneumonia    Vision abnormalities        Observations/Objective: No acute distress Normal mood and appearance  .Marland Kitchen Today's Vitals   06/09/22 1019  Weight: (!) 330 lb (149.7 kg)  Height: 5\' 7"  (1.702 m)   Body mass index is 51.69 kg/m.     Assessment and Plan: Marland KitchenMarland KitchenRebecah was seen today for follow-up and adhd.  Diagnoses and all orders for this visit:  Attention deficit hyperactivity disorder (ADHD), combined type -     methylphenidate (METADATE ER) 20 MG ER tablet; Take 1 tablet (20 mg total) by mouth daily. -     methylphenidate (METADATE ER) 20 MG ER tablet; Take 1 tablet (20 mg total) by mouth daily.  Anxiety -     diazepam (VALIUM) 2 MG tablet; Take 1 tablet (2 mg total) by mouth every 12 (twelve) hours as needed for anxiety. -     sertraline (ZOLOFT) 100 MG tablet; Take 2 tablets (200 mg total) by mouth in the morning.  Trouble in sleeping -     diazepam (VALIUM) 2 MG tablet; Take 1 tablet (2 mg total) by mouth every 12 (twelve) hours as needed for anxiety.  Viral URI -     fexofenadine (ALLEGRA ALLERGY) 180 MG tablet; Take 1 tablet (180 mg total)  by mouth daily. -     fluticasone (FLONASE) 50 MCG/ACT nasal spray; Place 2 sprays into both nostrils daily.  Difficulty concentrating -     methylphenidate (METADATE ER) 20 MG ER tablet; Take 1 tablet (20 mg total) by mouth daily. -     methylphenidate (METADATE ER) 20 MG ER tablet; Take 1 tablet (20 mg total) by mouth daily. -     methylphenidate (METADATE ER) 20 MG ER tablet; Take 1 tablet (20 mg total) by mouth daily.  Insomnia, unspecified type -     traZODone (DESYREL) 50 MG tablet; Take 0.5-2 tablets  (25-100 mg total) by mouth at bedtime as needed for sleep.  Medication management -     COMPLETE METABOLIC PANEL WITH GFR -     TSH  Class 3 severe obesity due to excess calories without serious comorbidity with body mass index (BMI) of 50.0 to 59.9 in adult [facility])  Strain of lumbar region, subsequent encounter -     tizanidine (ZANAFLEX) 6 MG capsule; Take 1 capsule (6 mg total) by mouth 3 (three) times daily as needed for muscle spasms.  Refilled medication.  Follow up in 3 months.     Follow Up Instructions:    I discussed the assessment and treatment plan with the patient. The patient was provided an opportunity to ask questions and all were answered. The patient agreed with the plan and demonstrated an understanding of the instructions.   The patient was advised to call back or seek an in-person evaluation if the symptoms worsen or if the condition fails to improve as anticipated.   Tandy Gaw, PA-C

## 2022-06-10 ENCOUNTER — Other Ambulatory Visit (HOSPITAL_BASED_OUTPATIENT_CLINIC_OR_DEPARTMENT_OTHER): Payer: Self-pay

## 2022-06-11 ENCOUNTER — Encounter: Payer: Self-pay | Admitting: Physician Assistant

## 2022-07-11 ENCOUNTER — Other Ambulatory Visit (HOSPITAL_COMMUNITY): Payer: Self-pay

## 2022-07-12 ENCOUNTER — Other Ambulatory Visit (HOSPITAL_COMMUNITY): Payer: Self-pay

## 2022-07-15 ENCOUNTER — Other Ambulatory Visit (HOSPITAL_BASED_OUTPATIENT_CLINIC_OR_DEPARTMENT_OTHER): Payer: Self-pay

## 2022-08-08 ENCOUNTER — Other Ambulatory Visit: Payer: Self-pay | Admitting: Physician Assistant

## 2022-08-08 DIAGNOSIS — K219 Gastro-esophageal reflux disease without esophagitis: Secondary | ICD-10-CM

## 2022-08-08 DIAGNOSIS — R112 Nausea with vomiting, unspecified: Secondary | ICD-10-CM

## 2022-08-10 ENCOUNTER — Other Ambulatory Visit (HOSPITAL_BASED_OUTPATIENT_CLINIC_OR_DEPARTMENT_OTHER): Payer: Self-pay

## 2022-08-10 ENCOUNTER — Other Ambulatory Visit (HOSPITAL_COMMUNITY): Payer: Self-pay

## 2022-08-10 ENCOUNTER — Other Ambulatory Visit: Payer: Self-pay

## 2022-08-10 MED ORDER — OMEPRAZOLE 40 MG PO CPDR
40.0000 mg | DELAYED_RELEASE_CAPSULE | Freq: Every day | ORAL | 3 refills | Status: DC
  Filled 2022-08-10: qty 90, 90d supply, fill #0

## 2022-08-12 ENCOUNTER — Other Ambulatory Visit: Payer: Self-pay

## 2022-08-12 ENCOUNTER — Other Ambulatory Visit (HOSPITAL_BASED_OUTPATIENT_CLINIC_OR_DEPARTMENT_OTHER): Payer: Self-pay

## 2022-08-19 ENCOUNTER — Other Ambulatory Visit: Payer: Self-pay | Admitting: Nurse Practitioner

## 2022-08-19 DIAGNOSIS — J40 Bronchitis, not specified as acute or chronic: Secondary | ICD-10-CM

## 2022-08-19 NOTE — Telephone Encounter (Signed)
Rx written by an external provider.

## 2022-08-20 ENCOUNTER — Other Ambulatory Visit: Payer: Self-pay

## 2022-08-20 MED ORDER — ALBUTEROL SULFATE HFA 108 (90 BASE) MCG/ACT IN AERS
2.0000 | INHALATION_SPRAY | Freq: Four times a day (QID) | RESPIRATORY_TRACT | 0 refills | Status: DC | PRN
  Filled 2022-08-20: qty 6.7, 25d supply, fill #0

## 2022-08-23 ENCOUNTER — Other Ambulatory Visit (HOSPITAL_BASED_OUTPATIENT_CLINIC_OR_DEPARTMENT_OTHER): Payer: Self-pay

## 2022-09-01 ENCOUNTER — Other Ambulatory Visit (HOSPITAL_BASED_OUTPATIENT_CLINIC_OR_DEPARTMENT_OTHER): Payer: Self-pay

## 2022-09-08 ENCOUNTER — Other Ambulatory Visit: Payer: Self-pay

## 2022-09-08 ENCOUNTER — Encounter: Payer: Self-pay | Admitting: Physician Assistant

## 2022-09-08 ENCOUNTER — Other Ambulatory Visit (HOSPITAL_COMMUNITY): Payer: Self-pay

## 2022-09-29 NOTE — Progress Notes (Signed)
09/30/2022 SHONTRICE AROCHA [id number] 1996-08-04  Referring provider: Donella Stade, PA-C Primary GI doctor: Dr. Tarri Glenn  ASSESSMENT AND PLAN:   Diarrhea for 2 years with associated rectal bleeding, urgency, occ nocturnal symptoms Mom with history of colitis With chronicity will not get infectious labs Check CBC, last checked 2022 Get Sed rate, CRP Check celiac panel Can do trial of IBGARD daily FODMAP, trial off lactulose and lifestyle changes discussed Will schedule for endoscopic evaluation at the hospital due to BMI, discussed with patient and agrees with plan. Consider SIBO testing or xifaxin trial pending results Consider serotonin syndrome and cutting back on some SSRI's  Nausea and vomiting, unspecified vomiting type Improved with reglan, will schedule EGD to evaluate Consider GES if negative  Class 3 severe obesity due to excess calories without serious comorbidity with body mass index (BMI) of 50.0 to 59.9 in adult [facility]) Will need to have done at the hospital due to her BMI   I recommend upper gastrointestinal and colorectal evaluation with an EGD and colonoscopy at the hospital.  Risk of bowel prep, conscious sedation, and EGD and colonoscopy were discussed.  Risks include but are not limited to dehydration, pain, bleeding, cardiopulmonary process, bowel perforation, or other possible adverse outcomes..  Treatment plan was discussed with patient, and agreed upon.   Patient Care Team: Lavada Mesi as PCP - General (Family Medicine) Silverio Decamp, MD as Consulting Physician (Sports Medicine)  HISTORY OF PRESENT ILLNESS: 27 y.o. 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   She states last two years has had IBS in her opinion.  She has had 4 Bm's today, on  average has 3-4 a day.  Rare nocturnal symptoms, can wake up at 2 AM with BM, last time a month ago.  She has a lot of urgency with the Bm's.  Had rectal bleeding once a week for last 3 months, in the toilet, TP, some rectal pain rare, BRB to dark red blood, moderate to large volume.  Imodium can help some.  No AB pain, can have AB bloating/tightness.  Has had nausea and vomiting x 2 years, zofran has not helped, reglan helped some. Nausea is worse in the morning with waking up, at least once a week.  She use to have GERD, since being on prilosec 40 mg daily for 1-2 years no GERD.  No dysphagia, no melena.  She is on reglan as needed, potentially every other day.  She works from home and it has interfered with her work.  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   Denies rashes/joint.   No NSAIDS, ETOH once a week, no drug use.  Patient has been on zoloft 254m for 2-3 years, was on prozac prior, on trazodone 1064m  She can have sweating a lot, never feels flushing. Occ palpitations and anxiety. No family history of colon cancer, mother with colitis but uncertain what time.  No weight loss.   Wt Readings from Last 3 Encounters:  09/30/22 (!) 360 lb (163.3 kg)  06/09/22 (!) 330 lb (149.7 kg)  12/23/21 (!) 330 lb (149.7 kg)     She  reports that she quit smoking about 5 years ago. Her smoking use included cigarettes. She has a 0.70 pack-year smoking history. She has never used smokeless tobacco. She reports  current alcohol use of about 1.0 standard drink of alcohol per week. She reports that she does not use drugs.  RELEVANT LABS AND IMAGING: CBC    Component Value Date/Time   WBC 12.3 (H) 04/15/2021 0000   RBC 5.23 (H) 04/15/2021 0000   HGB 13.9 04/15/2021 0000   HGB 13.2 05/23/2018 1500   HCT 43.1 04/15/2021 0000   HCT 40.6 05/23/2018 1500   PLT 247 04/15/2021 0000   PLT 258 05/23/2018 1500   MCV 82.4 04/15/2021 0000   MCV 84 05/23/2018 1500   MCH 26.6 (L)  04/15/2021 0000   MCHC 32.3 04/15/2021 0000   RDW 13.6 04/15/2021 0000   RDW 13.9 05/23/2018 1500   LYMPHSABS 2,903 04/15/2021 0000   LYMPHSABS 3.3 (H) 05/23/2018 1500   MONOABS 0.8 03/22/2015 2112   EOSABS 148 04/15/2021 0000   EOSABS 0.1 05/23/2018 1500   BASOSABS 37 04/15/2021 0000   BASOSABS 0.0 05/23/2018 1500   No results for input(s): "HGB" in the last 8760 hours.   CMP     Component Value Date/Time   NA 139 04/15/2021 0000   NA 141 05/23/2018 1500   K 4.4 04/15/2021 0000   CL 104 04/15/2021 0000   CO2 26 04/15/2021 0000   GLUCOSE 86 04/15/2021 0000   BUN 9 04/15/2021 0000   BUN 9 05/23/2018 1500   CREATININE 0.58 04/15/2021 0000   CALCIUM 9.3 04/15/2021 0000   PROT 6.8 04/15/2021 0000   PROT 6.4 05/23/2018 1500   ALBUMIN 3.8 10/09/2020 1046   ALBUMIN 4.3 05/23/2018 1500   AST 14 04/15/2021 0000   ALT 15 04/15/2021 0000   ALKPHOS 74 10/09/2020 1046   BILITOT 0.3 04/15/2021 0000   BILITOT <0.2 05/23/2018 1500   GFRNONAA >60 10/09/2020 1046   GFRNONAA 119 09/06/2019 0956   GFRAA 138 09/06/2019 0956      Latest Ref Rng & Units 04/15/2021   12:00 AM 10/09/2020   10:46 AM 09/06/2019    9:56 AM  Hepatic Function  Total Protein 6.1 - 8.1 g/dL 6.8  7.3  6.6   Albumin 3.5 - 5.0 g/dL  3.8    AST 10 - 30 U/L 14  18  12   $ ALT 6 - 29 U/L 15  17  12   $ Alk Phosphatase 38 - 126 U/L  74    Total Bilirubin 0.2 - 1.2 mg/dL 0.3  0.4  0.5       Current Medications:    Current Outpatient Medications (Cardiovascular):    propranolol (INDERAL) 10 MG tablet, Take 10 mg by mouth 3 (three) times daily as needed (for anxiety).  Current Outpatient Medications (Respiratory):    albuterol (VENTOLIN HFA) 108 (90 Base) MCG/ACT inhaler, Inhale 2 puffs into the lungs every 6 (six) hours as needed for wheezing or shortness of breath.   fluticasone (FLONASE) 50 MCG/ACT nasal spray, Place 2 sprays into both nostrils daily.   fexofenadine (ALLEGRA ALLERGY) 180 MG tablet, Take 1 tablet  (180 mg total) by mouth daily. (Patient not taking: Reported on 09/30/2022)    Current Outpatient Medications (Other):    baclofen (LIORESAL) 10 MG tablet, Take 1 tablet (10 mg total) by mouth 3 (three) times daily.   diazepam (VALIUM) 2 MG tablet, Take 1 tablet (2 mg total) by mouth every 12 (twelve) hours as needed for anxiety.   methylphenidate (METADATE ER) 20 MG ER tablet, Take 1 tablet (20 mg total) by mouth daily.   methylphenidate (METADATE ER) 20 MG  ER tablet, Take 1 tablet (20 mg total) by mouth daily.   methylphenidate (METADATE ER) 20 MG ER tablet, Take 1 tablet (20 mg total) by mouth daily.   metoCLOPramide (REGLAN) 10 MG tablet, Take 1 tablet by mouth 3 times a day as needed for nausea   omeprazole (PRILOSEC) 40 MG capsule, Take 1 capsule (40 mg total) by mouth daily.   sertraline (ZOLOFT) 100 MG tablet, Take 2 tablets (200 mg total) by mouth in the morning.   tizanidine (ZANAFLEX) 6 MG capsule, Take 1 capsule (6 mg total) by mouth 3 (three) times daily as needed for muscle spasms.   traZODone (DESYREL) 50 MG tablet, Take 0.5-2 tablets (25-100 mg total) by mouth at bedtime as needed for sleep.  Medical History:  Past Medical History:  Diagnosis Date   ADHD    Anxiety    Benign paroxysmal positional vertigo    Bulging lumbar disc 08/16/2012   Dry eye syndrome    HA (headache)    History of anal fissures    Low back pain    Neck pain    Pneumonia    Pseudopapilledema    Shoulder pain    Vision abnormalities    wears contacts, right eye worse   Allergies:  Allergies  Allergen Reactions   Peanut-Containing Drug Products Swelling and Other (See Comments)    Childhood reaction- lips became swollen/breathing not affected, though     Surgical History:  She  has a past surgical history that includes Suboccipital craniectomy cervical laminectomy (N/A, 05/01/2013). Family History:  Her family history includes ADD / ADHD in her maternal aunt; Anxiety disorder in her  maternal aunt, maternal grandfather, mother, and sister; Arthritis in her father; Dementia in her maternal grandfather; Depression in her maternal aunt, maternal grandfather, mother, and sister; High blood pressure in her father; Physical abuse in her sister; Sexual abuse in her sister.  REVIEW OF SYSTEMS  : All other systems reviewed and negative except where noted in the History of Present Illness.  PHYSICAL EXAM: BP 118/64   Pulse (!) 106   Ht 5' 7"$  (1.702 m)   Wt (!) 360 lb (163.3 kg)   BMI 56.38 kg/m  General Appearance: Well nourished, in no apparent distress. Head:   Normocephalic and atraumatic. Eyes:  sclerae anicteric,conjunctive pink  Respiratory: Respiratory effort normal, BS equal bilaterally without rales, rhonchi, wheezing. Cardio: RRR with no MRGs. Peripheral pulses intact.  Abdomen: Soft,  Obese ,active bowel sounds. mild tenderness in the lower abdomen. Without guarding and Without rebound. No masses. Rectal: declines Musculoskeletal: Full ROM, Normal gait. Without edema. Skin:  Dry and intact without significant lesions or rashes Neuro: Alert and  oriented x4;  No focal deficits. Psych:  Cooperative. Normal mood and affect.    Vladimir Crofts, PA-C 3:06 PM

## 2022-09-30 ENCOUNTER — Other Ambulatory Visit (INDEPENDENT_AMBULATORY_CARE_PROVIDER_SITE_OTHER): Payer: BC Managed Care – PPO

## 2022-09-30 ENCOUNTER — Ambulatory Visit (INDEPENDENT_AMBULATORY_CARE_PROVIDER_SITE_OTHER): Payer: BC Managed Care – PPO | Admitting: Physician Assistant

## 2022-09-30 ENCOUNTER — Telehealth: Payer: Self-pay | Admitting: Physician Assistant

## 2022-09-30 ENCOUNTER — Other Ambulatory Visit (HOSPITAL_BASED_OUTPATIENT_CLINIC_OR_DEPARTMENT_OTHER): Payer: Self-pay

## 2022-09-30 VITALS — BP 118/64 | HR 106 | Ht 67.0 in | Wt 360.0 lb

## 2022-09-30 DIAGNOSIS — R197 Diarrhea, unspecified: Secondary | ICD-10-CM | POA: Diagnosis not present

## 2022-09-30 DIAGNOSIS — R112 Nausea with vomiting, unspecified: Secondary | ICD-10-CM

## 2022-09-30 DIAGNOSIS — K625 Hemorrhage of anus and rectum: Secondary | ICD-10-CM

## 2022-09-30 DIAGNOSIS — Z6841 Body Mass Index (BMI) 40.0 and over, adult: Secondary | ICD-10-CM

## 2022-09-30 LAB — CBC WITH DIFFERENTIAL/PLATELET
Basophils Absolute: 0.1 10*3/uL (ref 0.0–0.1)
Basophils Relative: 0.6 % (ref 0.0–3.0)
Eosinophils Absolute: 0.1 10*3/uL (ref 0.0–0.7)
Eosinophils Relative: 0.7 % (ref 0.0–5.0)
HCT: 40.5 % (ref 36.0–46.0)
Hemoglobin: 13.2 g/dL (ref 12.0–15.0)
Lymphocytes Relative: 22.6 % (ref 12.0–46.0)
Lymphs Abs: 3.5 10*3/uL (ref 0.7–4.0)
MCHC: 32.6 g/dL (ref 30.0–36.0)
MCV: 78.1 fl (ref 78.0–100.0)
Monocytes Absolute: 0.7 10*3/uL (ref 0.1–1.0)
Monocytes Relative: 4.5 % (ref 3.0–12.0)
Neutro Abs: 11.1 10*3/uL — ABNORMAL HIGH (ref 1.4–7.7)
Neutrophils Relative %: 71.6 % (ref 43.0–77.0)
Platelets: 267 10*3/uL (ref 150.0–400.0)
RBC: 5.19 Mil/uL — ABNORMAL HIGH (ref 3.87–5.11)
RDW: 15.2 % (ref 11.5–15.5)
WBC: 15.6 10*3/uL — ABNORMAL HIGH (ref 4.0–10.5)

## 2022-09-30 LAB — SEDIMENTATION RATE: Sed Rate: 66 mm/hr — ABNORMAL HIGH (ref 0–20)

## 2022-09-30 LAB — TSH: TSH: 2.61 u[IU]/mL (ref 0.35–5.50)

## 2022-09-30 MED ORDER — NA SULFATE-K SULFATE-MG SULF 17.5-3.13-1.6 GM/177ML PO SOLN
1.0000 | ORAL | 0 refills | Status: DC
  Filled 2022-09-30: qty 354, 1d supply, fill #0

## 2022-09-30 NOTE — Patient Instructions (Addendum)
Your provider has requested that you go to the basement level for lab work before leaving today. Press "B" on the elevator. The lab is located at the first door on the left as you exit the elevator.   First do a trial off milk/lactose products if you use them.  Add fiber like benefiber or citracel once a day Can take imodium as needed, up to 8 a day.  Can do trial of IBGard which is over the counter for AB pain- Take 1-2 capsules once a day for maintence or twice a day during a flare For IBS and peppermint oil.  Peppermint oil has been proven to be better than placebo for cramping for IBS Stop if it worsens heart burn or causes flushing of your face.  Ideally enteric coated peppermint oil capsules 2 a day is best but if you got the oil, you can use 0.56m or 180 mg of pepperment oil up to 3 x a day.   Check out serotonin syndrome  We may want to evaluate you for small intestinal bacterial overgrowth, this can cause increase gas, bloating, loose stools or constipation.  There is a test for this we can do or sometimes we will treat a patient with an antibiotic to see if it helps.     Please try low FODMAP diet- see below- start with eliminating just one column at a time, the table at the very bottom contains foods that are safe to take   FODMAP stands for fermentable oligo-, di-, mono-saccharides and polyols (1). These are the scientific terms used to classify groups of carbs that are notorious for triggering digestive symptoms like bloating, gas and stomach pain.     You have been scheduled for an endoscopy and colonoscopy. Please follow the written instructions given to you at your visit today. Please pick up your prep supplies at the pharmacy within the next 1-3 days. If you use inhalers (even only as needed), please bring them with you on the day of your procedure.  I appreciate the opportunity to care for you. AVicie Mutters PA-C

## 2022-09-30 NOTE — Telephone Encounter (Signed)
Patient is calling wanting to cancel her [facility] procedure, states she cannot afford it at the moment. Please advise

## 2022-10-01 LAB — COMPREHENSIVE METABOLIC PANEL
ALT: 20 U/L (ref 0–35)
AST: 15 U/L (ref 0–37)
Albumin: 4.3 g/dL (ref 3.5–5.2)
Alkaline Phosphatase: 78 U/L (ref 39–117)
BUN: 9 mg/dL (ref 6–23)
CO2: 24 mEq/L (ref 19–32)
Calcium: 9.7 mg/dL (ref 8.4–10.5)
Chloride: 102 mEq/L (ref 96–112)
Creatinine, Ser: 0.76 mg/dL (ref 0.40–1.20)
GFR: 107.62 mL/min (ref >60.00)
Glucose, Bld: 84 mg/dL (ref 70–99)
Potassium: 3.9 mEq/L (ref 3.5–5.1)
Sodium: 138 mEq/L (ref 135–145)
Total Bilirubin: 0.2 mg/dL (ref 0.2–1.2)
Total Protein: 7.6 g/dL (ref 6.0–8.3)

## 2022-10-01 LAB — TISSUE TRANSGLUTAMINASE, IGA: (tTG) Ab, IgA: <1 U/mL

## 2022-10-01 LAB — HIGH SENSITIVITY CRP: CRP, High Sensitivity: 23.68 mg/L — ABNORMAL HIGH (ref 0.000–5.000)

## 2022-10-01 LAB — IGA: Immunoglobulin A: 386 mg/dL — ABNORMAL HIGH (ref 47–310)

## 2022-10-01 NOTE — Telephone Encounter (Signed)
Colonoscopy cancelled as requested.

## 2022-10-01 NOTE — Progress Notes (Signed)
Reviewed and agree with management plans including EGD and colonoscopy.  Quantavius Humm L. Tarri Glenn, MD, MPH

## 2022-10-04 ENCOUNTER — Ambulatory Visit
Admission: EM | Admit: 2022-10-04 | Discharge: 2022-10-04 | Disposition: A | Discharge status: Home / Self Care | Payer: BC Managed Care – PPO

## 2022-10-04 ENCOUNTER — Encounter (HOSPITAL_BASED_OUTPATIENT_CLINIC_OR_DEPARTMENT_OTHER): Payer: Self-pay

## 2022-10-04 ENCOUNTER — Emergency Department (HOSPITAL_BASED_OUTPATIENT_CLINIC_OR_DEPARTMENT_OTHER): Payer: BC Managed Care – PPO

## 2022-10-04 ENCOUNTER — Emergency Department (HOSPITAL_BASED_OUTPATIENT_CLINIC_OR_DEPARTMENT_OTHER)
Admission: EM | Admit: 2022-10-04 | Discharge: 2022-10-04 | Disposition: A | Discharge status: Home / Self Care | Payer: BC Managed Care – PPO | Attending: Emergency Medicine | Admitting: Emergency Medicine

## 2022-10-04 ENCOUNTER — Telehealth: Payer: Self-pay | Admitting: Physician Assistant

## 2022-10-04 DIAGNOSIS — R110 Nausea: Secondary | ICD-10-CM | POA: Insufficient documentation

## 2022-10-04 DIAGNOSIS — R1011 Right upper quadrant pain: Secondary | ICD-10-CM | POA: Diagnosis not present

## 2022-10-04 DIAGNOSIS — Z91010 Allergy to peanuts: Secondary | ICD-10-CM | POA: Insufficient documentation

## 2022-10-04 DIAGNOSIS — R1031 Right lower quadrant pain: Secondary | ICD-10-CM | POA: Diagnosis not present

## 2022-10-04 DIAGNOSIS — R109 Unspecified abdominal pain: Secondary | ICD-10-CM | POA: Diagnosis not present

## 2022-10-04 DIAGNOSIS — D72829 Elevated white blood cell count, unspecified: Secondary | ICD-10-CM | POA: Insufficient documentation

## 2022-10-04 DIAGNOSIS — R197 Diarrhea, unspecified: Secondary | ICD-10-CM | POA: Diagnosis not present

## 2022-10-04 DIAGNOSIS — Z87891 Personal history of nicotine dependence: Secondary | ICD-10-CM | POA: Diagnosis not present

## 2022-10-04 LAB — COMPREHENSIVE METABOLIC PANEL
ALT: 17 U/L (ref 0–44)
AST: 16 U/L (ref 15–41)
Albumin: 3.9 g/dL (ref 3.5–5.0)
Alkaline Phosphatase: 71 U/L (ref 38–126)
Anion gap: 8 (ref 5–15)
BUN: 7 mg/dL (ref 6–20)
CO2: 21 mmol/L — ABNORMAL LOW (ref 22–32)
Calcium: 8.6 mg/dL — ABNORMAL LOW (ref 8.9–10.3)
Chloride: 105 mmol/L (ref 98–111)
Creatinine, Ser: 0.74 mg/dL (ref 0.44–1.00)
GFR, Estimated: >60 mL/min (ref >60)
Glucose, Bld: 91 mg/dL (ref 70–99)
Potassium: 3.8 mmol/L (ref 3.5–5.1)
Sodium: 134 mmol/L — ABNORMAL LOW (ref 135–145)
Total Bilirubin: 0.6 mg/dL (ref 0.3–1.2)
Total Protein: 7.6 g/dL (ref 6.5–8.1)

## 2022-10-04 LAB — URINALYSIS, MICROSCOPIC (REFLEX)

## 2022-10-04 LAB — CBC
HCT: 41.4 % (ref 36.0–46.0)
Hemoglobin: 13.3 g/dL (ref 12.0–15.0)
MCH: 25.3 pg — ABNORMAL LOW (ref 26.0–34.0)
MCHC: 32.1 g/dL (ref 30.0–36.0)
MCV: 78.7 fL — ABNORMAL LOW (ref 80.0–100.0)
Platelets: 284 10*3/uL (ref 150–400)
RBC: 5.26 MIL/uL — ABNORMAL HIGH (ref 3.87–5.11)
RDW: 14.1 % (ref 11.5–15.5)
WBC: 14.7 10*3/uL — ABNORMAL HIGH (ref 4.0–10.5)
nRBC: 0 % (ref 0.0–0.2)

## 2022-10-04 LAB — URINALYSIS, ROUTINE W REFLEX MICROSCOPIC
Bilirubin Urine: NEGATIVE
Glucose, UA: NEGATIVE mg/dL
Ketones, ur: NEGATIVE mg/dL
Leukocytes,Ua: NEGATIVE
Nitrite: NEGATIVE
Protein, ur: NEGATIVE mg/dL
Specific Gravity, Urine: 1.025 (ref 1.005–1.030)
pH: 6 (ref 5.0–8.0)

## 2022-10-04 LAB — LIPASE, BLOOD: Lipase: 26 U/L (ref 11–51)

## 2022-10-04 LAB — PREGNANCY, URINE: Preg Test, Ur: NEGATIVE

## 2022-10-04 MED ORDER — LACTATED RINGERS IV BOLUS
1000.0000 mL | Freq: Once | INTRAVENOUS | Status: AC
  Administered 2022-10-04: 1000 mL via INTRAVENOUS

## 2022-10-04 MED ORDER — SODIUM CHLORIDE 0.9 % IV BOLUS: 500.0000 mL | Freq: Once | INTRAVENOUS | Status: DC

## 2022-10-04 MED ORDER — ONDANSETRON HCL 4 MG/2ML IJ SOLN
4.0000 mg | Freq: Once | INTRAMUSCULAR | Status: AC
  Administered 2022-10-04: 4 mg via INTRAVENOUS
  Filled 2022-10-04: qty 2

## 2022-10-04 MED ORDER — IOHEXOL 300 MG/ML  SOLN
125.0000 mL | Freq: Once | INTRAMUSCULAR | Status: AC | PRN
  Administered 2022-10-04: 125 mL via INTRAVENOUS

## 2022-10-04 MED ORDER — HYDROMORPHONE HCL 1 MG/ML IJ SOLN: 1.0000 mg | Freq: Once | INTRAMUSCULAR | Status: DC

## 2022-10-04 MED ORDER — SUCRALFATE 1 G PO TABS: 1.0000 g | ORAL_TABLET | Freq: Three times a day (TID) | ORAL | 0 refills | Status: DC

## 2022-10-04 MED ORDER — DICYCLOMINE HCL 20 MG PO TABS: 20.0000 mg | ORAL_TABLET | Freq: Two times a day (BID) | ORAL | 0 refills | Status: DC

## 2022-10-04 MED ORDER — FENTANYL CITRATE PF 50 MCG/ML IJ SOSY
50.0000 ug | PREFILLED_SYRINGE | INTRAMUSCULAR | Status: DC | PRN
  Administered 2022-10-04: 50 ug via INTRAVENOUS
  Filled 2022-10-04: qty 1

## 2022-10-04 MED ORDER — SODIUM CHLORIDE 0.9 % IV SOLN: INTRAVENOUS | Status: DC

## 2022-10-04 NOTE — ED Triage Notes (Signed)
Pt presents to Urgent Care with c/o severe mid-bilateral abdominal pain, intermittent n/v, and diarrhea x 2 days. Reports hx of GI issues and just recently saw GI doctor.

## 2022-10-04 NOTE — Telephone Encounter (Signed)
Inbound call from patient , want to let Emma Boyer Know that she is going to Urgent care to have a X Ray because her stomach pain is unbearable.

## 2022-10-04 NOTE — Discharge Instructions (Addendum)
Please follow-up with [facility] gastro[facility] as you may benefit from outpatient colonoscopy given your chronic symptoms of abdominal cramping and pain, diarrhea and loose stools, blood in your stool.  The symptoms could be to inflammatory bowel disease.  Your CT imaging and laboratory evaluation was more reassuring today.  Offered admission for potential pain control and fluid resuscitation however, on repeat assessment your pain was controlled and you are tolerating oral intake.  Please call [facility] in the a.m. to schedule further outpatient evaluation.

## 2022-10-04 NOTE — ED Provider Notes (Signed)
[facility] Provider Note   CSN: [id number] Arrival date & time: 10/04/22  1338     History  Chief Complaint  Patient presents with   Abdominal Pain    Emma Boyer is a 27 y.o. female.  Sent from urgent care in [place] for concerns for right lower quadrant abdominal pain to rule out appendicitis.  Patient states that she started with right-sided abdominal pain on Saturday.  Right lower quadrant greater than right upper quadrant.  Associated with nausea no vomiting.  Is associated with some diarrhea 2 times today.  But she is actually been having some diarrhea for several weeks and was recently evaluated by gastroenterology for that.  Past medical history significant for low back pain and benign vertigo.  Patient's former smoker quit in 2018.       Home Medications Prior to Admission medications   Medication Sig Start Date End Date Taking? Authorizing Provider  albuterol (VENTOLIN HFA) 108 (90 Base) MCG/ACT inhaler Inhale 2 puffs into the lungs every 6 (six) hours as needed for wheezing or shortness of breath. 08/20/22   Breeback, Jade L, PA-C  baclofen (LIORESAL) 10 MG tablet Take 1 tablet (10 mg total) by mouth 3 (three) times daily. 11/02/21   Eliezer Lofts, FNP  diazepam (VALIUM) 2 MG tablet Take 1 tablet (2 mg total) by mouth every 12 (twelve) hours as needed for anxiety. 06/09/22   Breeback, Royetta Car, PA-C  fexofenadine (ALLEGRA ALLERGY) 180 MG tablet Take 1 tablet (180 mg total) by mouth daily. Patient not taking: Reported on 09/30/2022 06/09/22 06/04/23  Iran Planas L, PA-C  fluticasone (FLONASE) 50 MCG/ACT nasal spray Place 2 sprays into both nostrils daily. 06/09/22   Breeback, Royetta Car, PA-C  methylphenidate (METADATE ER) 20 MG ER tablet Take 1 tablet (20 mg total) by mouth daily. 06/09/22   Breeback, Royetta Car, PA-C  methylphenidate (METADATE ER) 20 MG ER tablet Take 1 tablet (20 mg total) by mouth daily. 07/09/22    Breeback, Royetta Car, PA-C  methylphenidate (METADATE ER) 20 MG ER tablet Take 1 tablet (20 mg total) by mouth daily. 08/07/22   Donella Stade, PA-C  metoCLOPramide (REGLAN) 10 MG tablet Take 1 tablet by mouth 3 times a day as needed for nausea 04/29/21   Breeback, Jade L, PA-C  Na Sulfate-K Sulfate-Mg Sulf 17.5-3.13-1.6 GM/177ML SOLN Take 1 kit by mouth as directed. 09/30/22   Vladimir Crofts, PA-C  omeprazole (PRILOSEC) 40 MG capsule Take 1 capsule (40 mg total) by mouth daily. 08/10/22   Breeback, Royetta Car, PA-C  propranolol (INDERAL) 10 MG tablet Take 10 mg by mouth 3 (three) times daily as needed (for anxiety). 07/14/20   [provider]  sertraline (ZOLOFT) 100 MG tablet Take 2 tablets (200 mg total) by mouth in the morning. 06/09/22   Breeback, Jade L, PA-C  tizanidine (ZANAFLEX) 6 MG capsule Take 1 capsule (6 mg total) by mouth 3 (three) times daily as needed for muscle spasms. 06/09/22   Breeback, Jade L, PA-C  traZODone (DESYREL) 50 MG tablet Take 0.5-2 tablets (25-100 mg total) by mouth at bedtime as needed for sleep. 06/09/22   Breeback, Royetta Car, PA-C      Allergies    Peanut-containing drug products    Review of Systems   Review of Systems  Constitutional:  Negative for chills and fever.  HENT:  Negative for ear pain and sore throat.   Eyes:  Negative for pain and  visual disturbance.  Respiratory:  Negative for cough and shortness of breath.   Cardiovascular:  Negative for chest pain and palpitations.  Gastrointestinal:  Positive for abdominal pain, diarrhea and nausea. Negative for vomiting.  Genitourinary:  Negative for dysuria and hematuria.  Musculoskeletal:  Negative for arthralgias and back pain.  Skin:  Negative for color change and rash.  Neurological:  Negative for seizures and syncope.  All other systems reviewed and are negative.   Physical Exam Updated Vital Signs BP (!) 159/84 (BP Location: Left Arm)   Pulse (!) 113   Temp 98.2 F (36.8 C)   Resp 18    Ht 1.702 m (5' 7"$ )   Wt (!) 163.3 kg   LMP 09/10/2022 (Exact Date)   SpO2 98%   BMI 56.38 kg/m  Physical Exam Vitals and nursing note reviewed.  Constitutional:      General: She is not in acute distress.    Appearance: She is well-developed. She is not ill-appearing.  HENT:     Head: Normocephalic and atraumatic.  Eyes:     Extraocular Movements: Extraocular movements intact.     Conjunctiva/sclera: Conjunctivae normal.     Pupils: Pupils are equal, round, and reactive to light.  Cardiovascular:     Rate and Rhythm: Normal rate and regular rhythm.     Heart sounds: No murmur heard. Pulmonary:     Effort: Pulmonary effort is normal. No respiratory distress.     Breath sounds: Normal breath sounds.  Abdominal:     General: Abdomen is protuberant.     Palpations: Abdomen is soft.     Tenderness: There is abdominal tenderness in the right lower quadrant. There is guarding.     Hernia: No hernia is present.  Musculoskeletal:        General: No swelling.     Cervical back: Neck supple.  Skin:    General: Skin is warm and dry.     Capillary Refill: Capillary refill takes less than 2 seconds.  Neurological:     General: No focal deficit present.     Mental Status: She is alert and oriented to person, place, and time.  Psychiatric:        Mood and Affect: Mood normal.     ED Results / Procedures / Treatments   Labs (all labs ordered are listed, but only abnormal results are displayed) Labs Reviewed  COMPREHENSIVE METABOLIC PANEL - Abnormal; Notable for the following components:      Result Value   Sodium 134 (*)    CO2 21 (*)    Calcium 8.6 (*)    All other components within normal limits  CBC - Abnormal; Notable for the following components:   WBC 14.7 (*)    RBC 5.26 (*)    MCV 78.7 (*)    MCH 25.3 (*)    All other components within normal limits  URINALYSIS, ROUTINE W REFLEX MICROSCOPIC - Abnormal; Notable for the following components:   Hgb urine dipstick  TRACE (*)    All other components within normal limits  URINALYSIS, MICROSCOPIC (REFLEX) - Abnormal; Notable for the following components:   Bacteria, UA RARE (*)    All other components within normal limits  LIPASE, BLOOD  PREGNANCY, URINE    EKG None  Radiology No results found.  Procedures Procedures    Medications Ordered in ED Medications  ondansetron (ZOFRAN) injection 4 mg (has no administration in time range)  HYDROmorphone (DILAUDID) injection 1 mg (has no administration in  time range)  0.9 %  sodium chloride infusion (has no administration in time range)  sodium chloride 0.9 % bolus 500 mL (has no administration in time range)    ED Course/ Medical Decision Making/ A&P                             Medical Decision Making Amount and/or Complexity of Data Reviewed Labs: ordered. Radiology: ordered.  Risk Prescription drug management.   Patient with onset of right-sided abdominal pain right lower quadrant greater than right upper quadrant on Saturday.  Sent in from urgent care for concerns for appendicitis.  Patient does have localized tenderness in the right lower quadrant with guarding.  Patient is obese however.  Patient will need CT scan abdomen pelvis for further evaluation.  Lipase normal complete metabolic panel normal other than sodium being 134 liver function test are normal renal functions normal.  CBC white blood cell counts 14.7 hemoglobin is 13.3.  It was normal at 284.  Urinalysis negative for urinary tract infection.  Rare bacteria pregnancy test negative.  Patient will get IV fluids IV pain medicine and antinausea medicine and CT scan of the abdomen to rule out appendicitis.   Final Clinical Impression(s) / ED Diagnoses Final diagnoses:  Right lower quadrant abdominal pain    Rx / DC Orders ED Discharge Orders     None         Fredia Sorrow, MD 10/04/22 1458

## 2022-10-04 NOTE — ED Notes (Signed)
Patient is being discharged from the Urgent Care and sent to the Emergency Department via POV . Per L. Lilia Pro, [place], patient is in need of higher level of care due to severe abdominal pain. Patient is aware and verbalizes understanding of plan of care.  Vitals:   10/04/22 1308 10/04/22 1309  BP: (!) 153/105 (!) 164/101  Pulse: (!) 102   Resp: 20   Temp: 99.4 F (37.4 C)   SpO2: 97%

## 2022-10-04 NOTE — ED Notes (Signed)
Pt d/c'd by provider approx 25 mins ago.

## 2022-10-04 NOTE — ED Provider Notes (Signed)
  Physical Exam  BP (!) 159/84 (BP Location: Left Arm)   Pulse (!) 113   Temp 98.2 F (36.8 C)   Resp 18   Ht 5' 7"$  (1.702 m)   Wt (!) 163.3 kg   LMP 09/10/2022 (Exact Date)   SpO2 98%   BMI 56.38 kg/m     Procedures  Procedures  ED Course / MDM   Clinical Course as of 10/04/22 1607  Mon Oct 04, 2022  1504 WBC(!): 14.7 [JL]    Clinical Course User Index [JL] Regan Lemming, MD   Medical Decision Making Amount and/or Complexity of Data Reviewed Labs: ordered. Decision-making details documented in ED Course. Radiology: ordered.  Risk Prescription drug management.   43F, presenting with concern for appendicitis, RLQ abdominal pain, hx of chronic loose BMs, has seen GI in the past. Waiting on CT imaging. WBC 14K.   I evaluated the patient bedside, she endorses a history of intermittent diarrhea, crampy abdominal pain, blood in her stools.  Symptoms would be consistent potentially with a inflammatory bowel disease picture such as Crohn's colitis or ulcerative colitis.  The patient has right sided abdominal tenderness to palpation on exam.  She has a leukocytosis which appears to be chronic.  She has no anemia.  She denies any vomiting but has had difficulty tolerating oral intake due to pain.  She recently saw gastroenterologist [facility] outpatient and had elevated inflammatory markers.  Her CT abdomen pelvis was performed and was unremarkable today.  Urinalysis revealed trace hemoglobin, negative for UTI, CBC without anemia, leukocytosis to 14.7, CMP with normal renal function and liver function, normal biliary function.  Low concern for cholecystitis, pancreatitis with lipase normal, no evidence for UTI/pyelonephritis, no evidence for diverticulitis on CT.  Patient symptoms are more upper abdominal and not in her pelvis, low concern for pelvic abscess or tubo-ovarian abscess or other pelvic etiology.  Given the patient's longstanding history suspicious for  potential inflammatory bowel disease, [facility] gastroenterology was consulted for further recommendations.   Discussed with Dr. Tarri Glenn of on-call [facility] gastroenterology who recommended reassessment, admission for pain control and rehydration if patient unable to tolerate oral intake or manage her pain.  On repeat assessment, the patient was tolerating oral ginger ale.  She states that her pain is improved.  I recommended that she call [facility] gastroenterology in the morning to schedule outpatient colonoscopy and further evaluation.  Will discharge on Carafate and Bentyl and have the patient follow-up outpatient.  Overall well-appearing,  vitally stable, stable for discharge.    Regan Lemming, MD 10/04/22 [phone]

## 2022-10-04 NOTE — Telephone Encounter (Signed)
I will forward to Amanda's nurse

## 2022-10-04 NOTE — Telephone Encounter (Signed)
See note below

## 2022-10-04 NOTE — ED Triage Notes (Signed)
Sent here by Urgent care. C/o right lower abdominal pain, N/V, diarrhea x 2 days. Recently saw GI doctor, states her WBC was very high. C/o RLQ tenderness, here to r/o appendicitis.

## 2022-10-04 NOTE — ED Provider Notes (Signed)
Patient complains of severe right upper quadrant abdominal pain that "takes her breath away".  Patient states this is new but reports a history of chronic diarrhea over the past few years.  Patient was seen by gastroenterology 4 days ago for her chronic diarrhea, rudimentary labs were performed which revealed WBC's 15.6 with a left shift, CRP of 23.68, IgA 38.6 and sed rate of 66.  Patient is in a significant amount of distress at this time, tachycardic and has a temperature of 99.4 with a blood pressure of 164/101.  Patient is requesting x-ray of her abdomen today.  Patient advised that to rule out acute infection in her abdomen at this time she will need a CT scan.  Patient advised to go to the emergency room now.  Patient agreeable to going now.  Patient has a support person with her who agrees to take her now.   Lynden Oxford Scales, PA-C 10/04/22 1324

## 2022-10-05 ENCOUNTER — Telehealth: Payer: Self-pay | Admitting: Physician Assistant

## 2022-10-05 ENCOUNTER — Other Ambulatory Visit (HOSPITAL_BASED_OUTPATIENT_CLINIC_OR_DEPARTMENT_OTHER): Payer: Self-pay

## 2022-10-05 DIAGNOSIS — R197 Diarrhea, unspecified: Secondary | ICD-10-CM

## 2022-10-05 DIAGNOSIS — R112 Nausea with vomiting, unspecified: Secondary | ICD-10-CM

## 2022-10-05 DIAGNOSIS — K625 Hemorrhage of anus and rectum: Secondary | ICD-10-CM

## 2022-10-05 DIAGNOSIS — K219 Gastro-esophageal reflux disease without esophagitis: Secondary | ICD-10-CM

## 2022-10-05 MED ORDER — OMEPRAZOLE 40 MG PO CPDR
40.0000 mg | DELAYED_RELEASE_CAPSULE | Freq: Two times a day (BID) | ORAL | 3 refills | Status: DC
  Filled 2022-10-05: qty 180, 90d supply, fill #0
  Filled 2023-06-22: qty 180, 90d supply, fill #1

## 2022-10-05 NOTE — Telephone Encounter (Signed)
Pts procedures rescheduled at [facility] 11/04/22 at 10:45am. Pt was previously scheduled for this date and already has instructions.

## 2022-10-05 NOTE — Telephone Encounter (Signed)
Inbound call from patient stating that she had canceled her procedures at [facility] and is requesting a call back to discuss getting rescheduled. Please advise.

## 2022-10-05 NOTE — Telephone Encounter (Signed)
Procedures rescheduled at [facility] for 11/04/22 at 10:45am. Pt aware of appt. See note below from Vicie Mutters PA regarding PA.

## 2022-10-05 NOTE — Telephone Encounter (Signed)
Please see 10/05/2022 telephone encounter. Discussed results with the patient, will reschedule colonoscopy/endoscopy at the hospital secondary to BMI, patient states with prior authorization from insurance she should be able to proceed with much-needed procedures.

## 2022-10-05 NOTE — Telephone Encounter (Signed)
Patient's labs showed leukocytosis, elevated CRP and sed rate, no anemia.  Negative celiac, normal kidney liver. Patient also went to the ER with abdominal pain had CT abdomen pelvis that was negative, repeat labs showed continuing leukocytosis, possible starting of anemia. Patient continues to have abdominal pain, loose stools did have acute worsening over the weekend, no hematochezia. Patient was scheduled for colonoscopy endoscopy at the hospital due to BMI but this was canceled due to insurance.  Patient states she talked with her insurance further and with the prior authorization from her insurance she may not have to pay as much and she is willing to continue with the procedures.  Please schedule colonoscopy/endoscopy in the hospital. With chronicity I did not get a GI pathogen panel, but will get for completeness, patient is aware and will come to the lab to pick this up and bring it back in.  Please make sure the colonoscopy endoscopy is far enough out where the patient can complete this. Send 10 Prilosec 40 mg twice a day, can continue dicyclomine, continue Reglan.

## 2022-10-07 ENCOUNTER — Telehealth: Payer: Self-pay | Admitting: General Practice

## 2022-10-07 ENCOUNTER — Other Ambulatory Visit: Payer: Self-pay | Admitting: Physician Assistant

## 2022-10-07 DIAGNOSIS — F902 Attention-deficit hyperactivity disorder, combined type: Secondary | ICD-10-CM

## 2022-10-07 DIAGNOSIS — R41840 Attention and concentration deficit: Secondary | ICD-10-CM

## 2022-10-07 NOTE — Transitions of Care (Post Inpatient/ED Visit) (Signed)
   10/07/2022  Name: Emma Boyer MRN: [id number] DOB: [birth year]  Today's TOC FU Call Status: Today's TOC FU Call Status:: Successful TOC FU Call Competed TOC FU Call Complete Date: 10/07/22  Transition Care Management Follow-up Telephone Call Date of Discharge: 10/04/22 Discharge Facility: [facility] Type of Discharge: Emergency Department Reason for ED Visit: Other: (GI) How have you been since you were released from the hospital?: Better Any questions or concerns?: No  Items Reviewed: Did you receive and understand the discharge instructions provided?: Yes Medications obtained and verified?: Yes (Medications Reviewed) Any new allergies since your discharge?: No Dietary orders reviewed?: No  Home Care and Equipment/Supplies: [facility] Ordered?: No  Functional Questionnaire: Do you need assistance with bathing/showering or dressing?: No Do you need assistance with meal preparation?: No Do you need assistance with eating?: No Do you have difficulty maintaining continence: No Do you need assistance with getting out of bed/getting out of a chair/moving?: No Do you have difficulty managing or taking your medications?: No  Folllow up appointments reviewed: PCP Follow-up appointment confirmed?: NA Specialist Hospital Follow-up appointment confirmed?: Yes Date of Specialist follow-up appointment?: 10/05/22 (via mychart) Follow-Up Specialty Provider:: GI doctor Do you need transportation to your follow-up appointment?: No Do you understand care options if your condition(s) worsen?: Yes-patient verbalized understanding    SIGNATURE: Tinnie Gens, RN BSN

## 2022-10-08 ENCOUNTER — Other Ambulatory Visit: Payer: Self-pay

## 2022-10-08 ENCOUNTER — Other Ambulatory Visit (HOSPITAL_BASED_OUTPATIENT_CLINIC_OR_DEPARTMENT_OTHER): Payer: Self-pay

## 2022-10-08 MED ORDER — METHYLPHENIDATE HCL ER 20 MG PO TBCR
20.0000 mg | EXTENDED_RELEASE_TABLET | Freq: Every day | ORAL | 0 refills | Status: DC
  Filled 2022-10-08: qty 30, 30d supply, fill #0

## 2022-10-08 NOTE — Telephone Encounter (Signed)
Last written 08/07/2022 for one month Last appt 06/09/2022

## 2022-10-13 ENCOUNTER — Other Ambulatory Visit: Payer: BC Managed Care – PPO

## 2022-10-14 NOTE — Telephone Encounter (Signed)
Spoke with pt and let her know that it is fine to have the forms faxed to the office but there is a 29.00 fee that is charged to complete the papers. She is aware.

## 2022-10-15 ENCOUNTER — Other Ambulatory Visit (HOSPITAL_BASED_OUTPATIENT_CLINIC_OR_DEPARTMENT_OTHER): Payer: Self-pay

## 2022-10-15 ENCOUNTER — Ambulatory Visit
Admission: EM | Admit: 2022-10-15 | Discharge: 2022-10-15 | Disposition: A | Discharge status: Home / Self Care | Payer: BC Managed Care – PPO | Attending: Family Medicine | Admitting: Family Medicine

## 2022-10-15 DIAGNOSIS — M2170 Unequal limb length (acquired), unspecified site: Secondary | ICD-10-CM

## 2022-10-15 DIAGNOSIS — M461 Sacroiliitis, not elsewhere classified: Secondary | ICD-10-CM

## 2022-10-15 MED ORDER — METHYLPREDNISOLONE SODIUM SUCC 125 MG IJ SOLR
80.0000 mg | Freq: Once | INTRAMUSCULAR | Status: AC
  Administered 2022-10-15: 80 mg via INTRAMUSCULAR

## 2022-10-15 MED ORDER — PREDNISONE 20 MG PO TABS
ORAL_TABLET | ORAL | 0 refills | Status: DC
  Filled 2022-10-15: qty 11, 7d supply, fill #0

## 2022-10-15 MED ORDER — HYDROCODONE-ACETAMINOPHEN 5-325 MG PO TABS
1.0000 | ORAL_TABLET | Freq: Four times a day (QID) | ORAL | 0 refills | Status: DC | PRN
  Filled 2022-10-15: qty 12, 3d supply, fill #0

## 2022-10-15 NOTE — ED Triage Notes (Signed)
Pt presents to Urgent Care with c/o worsening R lower back pain since yesterday morning. States she has taken ibuprofen without relief. Hx of this happening but no known disc disease.

## 2022-10-15 NOTE — ED Provider Notes (Signed)
[facility]    CSN: [id number] Arrival date & time: 10/15/22  1413      History   Chief Complaint Chief Complaint  Patient presents with   Back Pain    HPI Emma Boyer is a 27 y.o. female.   Patient awoke yesterday with non-radiating lower back pain that has persisted.  She recalls no injury or change in physical activities.  She has had several similar episodes in the past that have resolved spontaneously. Review of records reveals a LS spine x-ray done 11/02/21 that showed thorocolumnar levocurvature.  The history is provided by the patient.  Back Pain Location:  Lumbar spine and sacro-iliac joint Quality:  Aching Radiates to:  Does not radiate Pain severity:  Severe Pain is:  Same all the time Onset quality:  Sudden Duration:  1 day Timing:  Constant Progression:  Worsening Chronicity:  Recurrent Context: not falling, not lifting heavy objects, not MCA, not occupational injury, not recent illness and not recent injury   Relieved by:  Nothing Worsened by:  Ambulation and bending Ineffective treatments:  NSAIDs Associated symptoms: no abdominal pain, no bladder incontinence, no bowel incontinence, no dysuria, no fever, no leg pain, no numbness, no paresthesias, no pelvic pain, no perianal numbness and no weakness   Risk factors: obesity     Past Medical History:  Diagnosis Date   ADHD    Anxiety    Benign paroxysmal positional vertigo    Bulging lumbar disc 08/16/2012   Dry eye syndrome    HA (headache)    History of anal fissures    Low back pain    Neck pain    Pneumonia    Pseudopapilledema    Shoulder pain    Vision abnormalities    wears contacts, right eye worse    Patient Active Problem List   Diagnosis Date Noted   Attention deficit hyperactivity disorder (ADHD), combined type 11/22/2021   Strain of lumbar region 11/19/2021   Non-intractable vomiting 04/15/2021   Class 3 severe obesity due to excess calories without serious  comorbidity with body mass index (BMI) of 50.0 to 59.9 in adult ([place]) 04/15/2021   Gastroesophageal reflux disease 04/15/2021   Acute suppurative inflammation of sphenoidal sinus 10/13/2020   Empty sella turcica (HCC) 10/13/2020   Benign paroxysmal positional vertigo, right 10/13/2020   Insomnia 10/13/2020   Anxiety 10/13/2020   H/O degenerative disc disease 02/07/2019   Temporomandibular joint arthralgia, right 04/25/2017   Hemoptysis 09/07/2016   De Quervain's tenosynovitis, bilateral 11/21/2015   History of migraine headaches 08/29/2015   Abnormal weight gain 01/27/2015   Migraine headache 01/27/2015   Pseudopapilledema    Shoulder pain    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      Home Medications    Prior to Admission medications   Medication Sig Start Date End Date Taking? Authorizing Provider  HYDROcodone-acetaminophen (NORCO/VICODIN) 5-325 MG tablet Take 1 tablet by mouth every 6 (six) hours as needed for moderate pain or severe pain. 10/15/22  Yes Kandra Nicolas, MD  ibuprofen (ADVIL) 800 MG tablet Take 800 mg by mouth every 8 (eight) hours as needed.  Yes [provider]  predniSONE (DELTASONE) 20 MG tablet Take one tab by mouth twice daily for 4 days, then one daily for 3 days. Take with food. 10/15/22  Yes Kandra Nicolas, MD  albuterol (VENTOLIN HFA) 108 (90 Base) MCG/ACT inhaler Inhale 2 puffs into the lungs every 6 (six) hours as needed for wheezing or shortness of breath. 08/20/22   Breeback, Jade L, PA-C  baclofen (LIORESAL) 10 MG tablet Take 1 tablet (10 mg total) by mouth 3 (three) times daily.  11/02/21   Eliezer Lofts, FNP  diazepam (VALIUM) 2 MG tablet Take 1 tablet (2 mg total) by mouth every 12 (twelve) hours as needed for anxiety. 06/09/22   Breeback, Jade L, PA-C  dicyclomine (BENTYL) 20 MG tablet Take 1 tablet (20 mg total) by mouth 2 (two) times daily. 10/04/22   Regan Lemming, MD  fexofenadine [facility] ALLERGY) 180 MG tablet Take 1 tablet (180 mg total) by mouth daily. Patient not taking: Reported on 09/30/2022 06/09/22 06/04/23  Iran Planas L, PA-C  fluticasone (FLONASE) 50 MCG/ACT nasal spray Place 2 sprays into both nostrils daily. 06/09/22   Breeback, Royetta Car, PA-C  methylphenidate (METADATE ER) 20 MG ER tablet Take 1 tablet (20 mg total) by mouth daily. 06/09/22   Breeback, Royetta Car, PA-C  methylphenidate (METADATE ER) 20 MG ER tablet Take 1 tablet (20 mg total) by mouth daily. 07/09/22   Breeback, Royetta Car, PA-C  methylphenidate (METADATE ER) 20 MG ER tablet Take 1 tablet (20 mg total) by mouth daily. 10/08/22   Donella Stade, PA-C  metoCLOPramide (REGLAN) 10 MG tablet Take 1 tablet by mouth 3 times a day as needed for nausea 04/29/21   Breeback, Jade L, PA-C  Na Sulfate-K Sulfate-Mg Sulf 17.5-3.13-1.6 GM/177ML SOLN Take 1 kit by mouth as directed. 09/30/22   Vladimir Crofts, PA-C  omeprazole (PRILOSEC) 40 MG capsule Take 1 capsule (40 mg total) by mouth 2 (two) times daily. 10/05/22   Vladimir Crofts, PA-C  propranolol (INDERAL) 10 MG tablet Take 10 mg by mouth 3 (three) times daily as needed (for anxiety). 07/14/20   [provider]  sertraline (ZOLOFT) 100 MG tablet Take 2 tablets (200 mg total) by mouth in the morning. 06/09/22   Breeback, Jade L, PA-C  sucralfate (CARAFATE) 1 g tablet Take 1 tablet (1 g total) by mouth with breakfast, with lunch, and with evening meal for 7 days. 10/04/22 10/11/22  Regan Lemming, MD  tizanidine (ZANAFLEX) 6 MG capsule Take 1 capsule (6 mg total) by mouth 3 (three) times daily as needed for muscle spasms. 06/09/22   Breeback, Jade  L, PA-C  traZODone (DESYREL) 50 MG tablet Take 0.5-2 tablets (25-100 mg total) by mouth at bedtime as needed for sleep. 06/09/22   Donella Stade, PA-C    Family History Family History  Problem Relation Age of Onset   Anxiety disorder Mother    Depression Mother    High blood pressure Father    Arthritis Father    Anxiety disorder Sister    Depression Sister    Physical abuse Sister    Sexual abuse Sister    Anxiety disorder Maternal Grandfather    Dementia Maternal Grandfather    Depression Maternal Grandfather    ADD / ADHD Maternal Aunt    Anxiety disorder Maternal Aunt    Depression Maternal Aunt    Colon cancer Neg Hx    Stomach cancer Neg Hx    Esophageal cancer Neg Hx  Social History Social History   Tobacco Use   Smoking status: Former    Packs/day: 0.10    Years: 7.00    Total pack years: 0.70    Types: Cigarettes    Quit date: 03/18/2017    Years since quitting: 5.5   Smokeless tobacco: Never   Tobacco comments:    Smokes about 2 per day.  Vaping Use   Vaping Use: Former   Substances: Nicotine  Substance Use Topics   Alcohol use: Yes    Alcohol/week: 1.0 standard drink of alcohol    Types: 1 Glasses of wine per week    Comment: once a week   Drug use: No     Allergies   Peanut-containing drug products   Review of Systems Review of Systems  Constitutional:  Positive for activity change. Negative for appetite change, chills, diaphoresis, fatigue and fever.  Gastrointestinal:  Negative for abdominal pain and bowel incontinence.  Genitourinary:  Negative for bladder incontinence, dysuria, flank pain, frequency, hematuria, pelvic pain and urgency.  Musculoskeletal:  Positive for back pain.  Skin:  Negative for rash.  Neurological:  Negative for weakness, numbness and paresthesias.     Physical Exam Triage Vital Signs ED Triage Vitals  Enc Vitals Group     BP 10/15/22 1456 113/84     Pulse Rate 10/15/22 1456 97     Resp 10/15/22 1456 20      Temp 10/15/22 1456 (!) 97.5 F (36.4 C)     Temp Source 10/15/22 1456 Oral     SpO2 10/15/22 1456 98 %     Weight 10/15/22 1452 (!) 360 lb (163.3 kg)     Height 10/15/22 1452 '5\' 7"'$  (1.702 m)     Head Circumference --      Peak Flow --      Pain Score 10/15/22 1451 10     Pain Loc --      Pain Edu? --      Excl. in [place]? --    No data found.  Updated Vital Signs BP 113/84 (BP Location: Left Arm)   Pulse 97   Temp (!) 97.5 F (36.4 C) (Oral)   Resp 20   Ht '5\' 7"'$  (1.702 m)   Wt (!) 163.3 kg   LMP 09/12/2022 (Exact Date)   SpO2 98%   BMI 56.38 kg/m   Visual Acuity Right Eye Distance:   Left Eye Distance:   Bilateral Distance:    Right Eye Near:   Left Eye Near:    Bilateral Near:     Physical Exam Vitals and nursing note reviewed.  Constitutional:      General: She is not in acute distress.    Appearance: She is obese. She is not ill-appearing.  HENT:     Head: Normocephalic.  Eyes:     Conjunctiva/sclera: Conjunctivae normal.     Pupils: Pupils are equal, round, and reactive to light.  Cardiovascular:     Rate and Rhythm: Normal rate and regular rhythm.     Heart sounds: Normal heart sounds.  Pulmonary:     Breath sounds: Normal breath sounds.  Abdominal:     Palpations: Abdomen is soft.     Tenderness: There is no abdominal tenderness.  Musculoskeletal:     Cervical back: Normal range of motion.     Lumbar back: No swelling or bony tenderness. Decreased range of motion. Negative right straight leg raise test and negative left straight leg raise test.  Back:     Right lower leg: No edema.     Left lower leg: No edema.     Comments: Back:  Range of motion decreased.  Can heel/toe walk and squat without difficulty.  Decreased forward flexion.  Tenderness over the right SI joint.  Straight leg raising test is negative.  Sitting knee extension test is negative.  Strength and sensation in the lower extremities is normal.  Patellar and achilles reflexes  are normal. Right leg appears to be about 2+ cm longer that the left by inspection.   Skin:    General: Skin is warm and dry.     Findings: No rash.  Neurological:     Mental Status: She is alert.      UC Treatments / Results  Labs (all labs ordered are listed, but only abnormal results are displayed) Labs Reviewed - No data to display  EKG   Radiology No results found.  Procedures Procedures (including critical care time)  Medications Ordered in UC Medications  methylPREDNISolone sodium succinate (SOLU-MEDROL) 125 mg/2 mL injection 80 mg (has no administration in time range)    Initial Impression / Assessment and Plan / UC Course  I have reviewed the triage vital signs and the nursing notes.  Pertinent labs & imaging results that were available during my care of the patient were reviewed by me and considered in my medical decision making (see chart for details).    Right SI joint pain most likely resulting from leg length discrepancy (possibly secondary to scoliosis). Administered Solumedrol '80mg'$  IM, then begin prednisone burst/taper. Rx for Vicodin (#12, no refill). Controlled Substance Prescriptions I have consulted the [place] Controlled Substances Registry for this patient, and feel the risk/benefit ratio today is favorable for proceeding with this prescription for a controlled substance.   Followup with Dr. Aundria Mems ([facility]) for further management.  Final Clinical Impressions(s) / UC Diagnoses   Final diagnoses:  SI (sacroiliac) joint inflammation ([place])  Leg length discrepancy     Discharge Instructions      Begin prednisone Saturday 10/17/22. Apply ice pack for 20 to 30 minutes, 3 to 4 times daily  Continue until pain decreases.     ED Prescriptions     Medication Sig Dispense Auth. Provider   predniSONE (DELTASONE) 20 MG tablet Take one tab by mouth twice daily for 4 days, then one daily for 3 days. Take with food. 11 tablet  Kandra Nicolas, MD   HYDROcodone-acetaminophen (NORCO/VICODIN) 5-325 MG tablet Take 1 tablet by mouth every 6 (six) hours as needed for moderate pain or severe pain. 12 tablet Kandra Nicolas, MD         Kandra Nicolas, MD 10/17/22 [phone]

## 2022-10-15 NOTE — Discharge Instructions (Signed)
Begin prednisone Saturday 10/17/22. Apply ice pack for 20 to 30 minutes, 3 to 4 times daily  Continue until pain decreases.

## 2022-10-18 NOTE — Telephone Encounter (Addendum)
Filled out FMLA to allow patient ample time for urgency and diarrhea.  Has colonoscopy scheduled 11/04/2022.  Patient encouraged to continue to keep this appointment. Please set up GI pathogen panel for diarrhea, patient states she had done it but then due to back pain was unable to bring it back in.  Can we please set this up front for the patient to pick back up. After patient is result for GI pathogen panel can consider sample of Lomotil.

## 2022-10-19 ENCOUNTER — Encounter: Payer: Self-pay | Admitting: Physician Assistant

## 2022-10-20 ENCOUNTER — Ambulatory Visit (INDEPENDENT_AMBULATORY_CARE_PROVIDER_SITE_OTHER): Payer: BC Managed Care – PPO | Admitting: Family Medicine

## 2022-10-20 ENCOUNTER — Other Ambulatory Visit (HOSPITAL_BASED_OUTPATIENT_CLINIC_OR_DEPARTMENT_OTHER): Payer: Self-pay

## 2022-10-20 ENCOUNTER — Encounter: Payer: Self-pay | Admitting: Family Medicine

## 2022-10-20 VITALS — BP 149/88 | HR 97 | Temp 98.7°F | Ht 67.0 in | Wt 361.6 lb

## 2022-10-20 DIAGNOSIS — R030 Elevated blood-pressure reading, without diagnosis of hypertension: Secondary | ICD-10-CM | POA: Diagnosis not present

## 2022-10-20 DIAGNOSIS — M545 Low back pain, unspecified: Secondary | ICD-10-CM

## 2022-10-20 MED ORDER — NAPROXEN 500 MG PO TABS
500.0000 mg | ORAL_TABLET | Freq: Two times a day (BID) | ORAL | 0 refills | Status: DC
  Filled 2022-10-20: qty 28, 14d supply, fill #0

## 2022-10-20 MED ORDER — CYCLOBENZAPRINE HCL 10 MG PO TABS
10.0000 mg | ORAL_TABLET | Freq: Three times a day (TID) | ORAL | 0 refills | Status: DC | PRN
  Filled 2022-10-20: qty 30, 10d supply, fill #0

## 2022-10-20 NOTE — Progress Notes (Signed)
Established Patient Office Visit  Subjective   Patient ID: Emma Boyer, female    DOB: 06/01/1996  Age: 27 y.o. MRN: [id number]  Chief Complaint  Patient presents with   Back Pain    Started last Friday. Seen in UC. Round of steroids, and pain medicine. Starting Sunday pain has increased. She complains of constant pain and cannot sit for long periods of time. She is accompanied by her SisterApolonio Schneiders.    HPI Presents today for an acute visit with complaint of back pain, for last year she "has a flare up". "Feels like an air bubble in right lower back" Denies injury. Went to urgent care on Friday.  Symptoms have been present off and on for one year,  worse since Friday.  Associated symptoms include: none  Pertinent negatives:  no fever or chills. Denies radiating pain down legs. Denies changes to bowel and bladder, denies saddle paresthesia.  Pain severity: 10/10, appears very uncomfortable in room.  Treatments tried include : prednisone taper and vicodin per urgent care. Has 2 more days of prednisone left. Ice and heat.  Treatment effective : felt better on Saturday, Sunday pain started back.  Sick contacts : n/a  Chart review: lumbar x-ray on 11/02/21 with no acute findings. Has never had advanced imaging.   Severe pain, blood pressure elevated in office. Chart review reveals normal blood pressures at previous visits.     Review of Systems  Respiratory:  Negative for shortness of breath.   Cardiovascular:  Negative for chest pain.  Musculoskeletal:  Positive for back pain. Negative for falls.      Objective:     BP (!) 149/88   Pulse 97   Temp 98.7 F (37.1 C) (Oral)   Ht '5\' 7"'$  (1.702 m)   Wt (!) 361 lb 9.6 oz (164 kg)   LMP 09/12/2022 (Exact Date)   SpO2 100%   BMI 56.63 kg/m  BP Readings from Last 3 Encounters:  10/20/22 (!) 149/88  10/15/22 113/84  10/04/22 127/67      Physical Exam Vitals and nursing note reviewed.  Constitutional:      Appearance:  Normal appearance. She is obese.  Pulmonary:     Effort: Pulmonary effort is normal.  Musculoskeletal:     Comments: Normal gait. Normal coordination. ROM normal. 5/5 upper and lower extremity strength. Negative straight leg raise. No bony spinous process tenderness. No loss of sensation.    Skin:    General: Skin is warm and dry.  Neurological:     General: No focal deficit present.     Mental Status: She is alert. Mental status is at baseline.  Psychiatric:        Mood and Affect: Mood normal.        Behavior: Behavior normal.        Thought Content: Thought content normal.        Judgment: Judgment normal.     No results found for any visits on 10/20/22.    The ASCVD Risk score (Arnett DK, et al., 2019) failed to calculate for the following reasons:   The 2019 ASCVD risk score is only valid for ages 74 to 57    Assessment & Plan:   Problem List Items Addressed This Visit     Acute right-sided low back pain without sciatica - Primary    Reports having chronic pain pain off-and-on for the last year with "flareups "reports that it feels like an "air bubble" in the lower  right part of her back.  She denies injury.  Went to the urgent care on Friday, received a prednisone taper and Vicodin for pain.  She has 2 more days left of the prednisone taper.  Reports on Saturday she felt fine, pain returned on Sunday.  Denies fever, chills, radiating pain, changes to bowel and bladder and saddle paresthesia.  Pain today is 10 out of 10, appears very uncomfortable in the room.  Back assessment with normal gait, normal coordination, range of motion intact, 5/5 upper and lower extremity strength, negative straight leg raise,  no bony spinous process tenderness,  no loss of sensation.  Appears that her pain is muscular in nature.  Naproxen 500 mg twice daily with food x 2 weeks after she completes her prednisone taper.  Flexeril 10 mg 3 times daily as needed, she understands not to drive  while taking this medication.  Ambulatory referral for orthopedics for further evaluation/treatment.  She will follow-up with her PCP as needed.  Explained the nature of back pain, can take weeks to resolve.  She will alternate ice and heat as tolerated.      Relevant Medications   cyclobenzaprine (FLEXERIL) 10 MG tablet   naproxen (NAPROSYN) 500 MG tablet   Other Relevant Orders   Ambulatory referral to Orthopedics   Elevated blood pressure reading in office without diagnosis of hypertension    Severe 10/10 pain while in the office.  Blood pressure elevated today.  Blood pressure usually ranges 113-127/67-84. No further intervention needed today.      No red flag back symptoms.  Discussed risk of increased bleeding with taking naproxen with Zoloft. She will take naproxen with food for 2 weeks after completing prednisone taper in 2 days.   Return if symptoms worsen or fail to improve.    Chalmers Guest, FNP

## 2022-10-20 NOTE — Assessment & Plan Note (Addendum)
Reports having chronic pain pain off-and-on for the last year with "flareups "reports that it feels like an "air bubble" in the lower right part of her back.  She denies injury.  Went to the urgent care on Friday, received a prednisone taper and Vicodin for pain.  She has 2 more days left of the prednisone taper.  Reports on Saturday she felt fine, pain returned on Sunday.  Denies fever, chills, radiating pain, changes to bowel and bladder and saddle paresthesia.  Pain today is 10 out of 10, appears very uncomfortable in the room.  Back assessment with normal gait, normal coordination, range of motion intact, 5/5 upper and lower extremity strength, negative straight leg raise,  no bony spinous process tenderness,  no loss of sensation.  Appears that her pain is muscular in nature.  Naproxen 500 mg twice daily with food x 2 weeks after she completes her prednisone taper.  Flexeril 10 mg 3 times daily as needed, she understands not to drive while taking this medication.  Ambulatory referral for orthopedics for further evaluation/treatment.  She will follow-up with her PCP as needed.  Explained the nature of back pain, can take weeks to resolve.  She will alternate ice and heat as tolerated.

## 2022-10-20 NOTE — Assessment & Plan Note (Addendum)
Severe 10/10 pain while in the office.  Blood pressure elevated today.  Blood pressure usually ranges 113-127/67-84. No further intervention needed today.

## 2022-10-22 ENCOUNTER — Encounter: Payer: Self-pay | Admitting: Orthopaedic Surgery

## 2022-10-22 ENCOUNTER — Ambulatory Visit (INDEPENDENT_AMBULATORY_CARE_PROVIDER_SITE_OTHER): Payer: BC Managed Care – PPO | Admitting: Orthopaedic Surgery

## 2022-10-22 VITALS — BP 148/88 | HR 84 | Ht 67.0 in | Wt 360.0 lb

## 2022-10-22 DIAGNOSIS — S39012D Strain of muscle, fascia and tendon of lower back, subsequent encounter: Secondary | ICD-10-CM

## 2022-10-22 NOTE — Progress Notes (Signed)
Office Visit Note   Patient: Emma Boyer           Date of Birth: 1995-12-24           MRN: [id number] Visit Date: 10/22/2022              Requested by: Chalmers Guest, [place],  [place] [address] PCP: Donella Stade, PA-C   Assessment & Plan: Visit Diagnoses:  1. Strain of lumbar region, subsequent encounter     Plan: Discussed patient she likely has some mild disc protrusion with intermittent symptoms of variable intensity always on the right side.  Will set her up for some physical therapy recheck her 6 weeks.  She can continue anti-inflammatories and Flexeril, and ice treatments she has been doing.  Physical therapy ordered med [facility]. 68.  Follow-Up Instructions: No follow-ups on file.   Orders:  No orders of the defined types were placed in this encounter.  No orders of the defined types were placed in this encounter.     Procedures: No procedures performed   Clinical Data: No additional findings.   Subjective: Chief Complaint  Patient presents with   Lower Back - Pain    HPI 27 year old female works from home for [employer] is here with back pain that is been intermittent episodic is more severe in intensity at this time.  She had several episodes in the last year tends to come and go every few months.  Normally gets better after a few days.  She was seen at urgent care was told she had a little bit of scoliosis.  Denies radicular pain to her feet.  She has soreness of the right lumbosacral junction into the buttocks.  Some laterally over the trochanter.  She has had previous CT scan for right lower quadrant pain which showed narrowing in the L5-S1 disc space.,  ADHD, BMI 56 and migraines.  Had some wrist tenosynovitis problems with anxiety  Review of Systems all systems noncontributory to HPI.   Objective: Vital Signs: BP (!) 148/88   Pulse 84   Ht '5\' 7"'$  (1.702 m)   Wt (!) 360 lb (163.3 kg)   LMP 09/12/2022  (Exact Date)   BMI 56.38 kg/m   Physical Exam Constitutional:      Appearance: She is well-developed.  HENT:     Head: Normocephalic.     Right Ear: External ear normal.     Left Ear: External ear normal. There is no impacted cerumen.  Eyes:     Pupils: Pupils are equal, round, and reactive to light.  Neck:     Thyroid: No thyromegaly.     Trachea: No tracheal deviation.  Cardiovascular:     Rate and Rhythm: Normal rate.  Pulmonary:     Effort: Pulmonary effort is normal.  Abdominal:     Palpations: Abdomen is soft.  Musculoskeletal:     Cervical back: No rigidity.  Skin:    General: Skin is warm and dry.  Neurological:     Mental Status: She is alert and oriented to person, place, and time.  Psychiatric:        Behavior: Behavior normal.     Ortho Exam patient gets from sitting standing she walks on her toes walk on her heels anterior tib gastrocsoleus is normal to resisted testing.  Negative straight leg raising 90 degrees.  Tenderness over the right lumbosacral junction right side only some tenderness over the sciatic  notch and greater trochanter region.  Negative popliteal compression test.  Specialty Comments:  No specialty comments available.  Imaging: LINICAL DATA:  Low back pain   EXAM: LUMBAR SPINE - COMPLETE 4 VIEW   COMPARISON:  Lumbar spine radiograph dated February 07, 2019   FINDINGS: Five non-rib-bearing lumbar-type vertebral bodies. Transitional anatomy with lumbarization of S1. There is no evidence of lumbar spine fracture. Thoracolumbar levocurvature. No significant degenerative changes.   IMPRESSION: No acute osseous abnormality.     Electronically Signed   By: Yetta Glassman M.D.   On: 11/02/2021 11:14     PMFS History: Patient Active Problem List   Diagnosis Date Noted   Elevated blood pressure reading in office without diagnosis of hypertension 10/20/2022   Attention deficit hyperactivity disorder (ADHD), combined type 11/22/2021    Strain of lumbar region 11/19/2021   Non-intractable vomiting 04/15/2021   Class 3 severe obesity due to excess calories without serious comorbidity with body mass index (BMI) of 50.0 to 59.9 in adult ([place]) 04/15/2021   Gastroesophageal reflux disease 04/15/2021   Acute suppurative inflammation of sphenoidal sinus 10/13/2020   Empty sella turcica ([place]) 10/13/2020   Benign paroxysmal positional vertigo, right 10/13/2020   Insomnia 10/13/2020   Anxiety 10/13/2020   H/O degenerative disc disease 02/07/2019   Temporomandibular joint arthralgia, right 04/25/2017   Hemoptysis 09/07/2016   De Quervain's tenosynovitis, bilateral 11/21/2015   History of migraine headaches 08/29/2015   Abnormal weight gain 01/27/2015   Migraine headache 01/27/2015   Acute right-sided low back pain without sciatica 04/20/2013   Pseudopapilledema    Shoulder pain    Chiari malformation type I ([place]) 04/10/2013   Difficulty concentrating 09/08/2012   Past Medical History:  Diagnosis Date   ADHD    Anxiety    Benign paroxysmal positional vertigo    Bulging lumbar disc 08/16/2012   Dry eye syndrome    HA (headache)    History of anal fissures    Low back pain    Neck pain    Pneumonia    Pseudopapilledema    Shoulder pain    Vision abnormalities    wears contacts, right eye worse    Family History  Problem Relation Age of Onset   Anxiety disorder Mother    Depression Mother    High blood pressure Father    Arthritis Father    Anxiety disorder Sister    Depression Sister    Physical abuse Sister    Sexual abuse Sister    Anxiety disorder Maternal Grandfather    Dementia Maternal Grandfather    Depression Maternal Grandfather    ADD / ADHD Maternal Aunt    Anxiety disorder Maternal Aunt    Depression Maternal Aunt    Colon cancer Neg Hx    Stomach cancer Neg Hx    Esophageal cancer Neg Hx     Past Surgical History:  Procedure Laterality Date   SUBOCCIPITAL CRANIECTOMY CERVICAL  LAMINECTOMY N/A 05/01/2013   Procedure: Chiari Decompression;  Surgeon: Erline Levine, MD;  Location: [facility];  Service: Neurosurgery;  Laterality: N/A;  Chiari Decompression   Social History   Occupational History    Comment: Company secretary   Occupation: Nurse, children's  Tobacco Use   Smoking status: Former    Packs/day: 0.10    Years: 7.00    Total pack years: 0.70    Types: Cigarettes    Quit date: 03/18/2017    Years since quitting: 5.6   Smokeless tobacco: Never  Tobacco comments:    Smokes about 2 per day.  Vaping Use   Vaping Use: Former   Substances: Nicotine  Substance and Sexual Activity   Alcohol use: Yes    Alcohol/week: 1.0 standard drink of alcohol    Types: 1 Glasses of wine per week    Comment: once a week   Drug use: No   Sexual activity: Not on file

## 2022-10-25 ENCOUNTER — Ambulatory Visit: Payer: BC Managed Care – PPO | Admitting: Physician Assistant

## 2022-10-25 DIAGNOSIS — Z0279 Encounter for issue of other medical certificate: Secondary | ICD-10-CM

## 2022-10-27 ENCOUNTER — Ambulatory Visit: Payer: BC Managed Care – PPO | Admitting: Physician Assistant

## 2022-10-27 ENCOUNTER — Other Ambulatory Visit: Payer: BC Managed Care – PPO

## 2022-10-27 DIAGNOSIS — R112 Nausea with vomiting, unspecified: Secondary | ICD-10-CM

## 2022-10-27 DIAGNOSIS — A09 Infectious gastroenteritis and colitis, unspecified: Secondary | ICD-10-CM | POA: Diagnosis not present

## 2022-10-27 DIAGNOSIS — A048 Other specified bacterial intestinal infections: Secondary | ICD-10-CM | POA: Diagnosis not present

## 2022-10-27 DIAGNOSIS — K219 Gastro-esophageal reflux disease without esophagitis: Secondary | ICD-10-CM

## 2022-10-28 ENCOUNTER — Other Ambulatory Visit: Payer: Self-pay | Admitting: Family Medicine

## 2022-10-28 DIAGNOSIS — M545 Low back pain, unspecified: Secondary | ICD-10-CM

## 2022-10-29 ENCOUNTER — Other Ambulatory Visit (HOSPITAL_BASED_OUTPATIENT_CLINIC_OR_DEPARTMENT_OTHER): Payer: Self-pay

## 2022-10-29 ENCOUNTER — Encounter (HOSPITAL_COMMUNITY): Payer: Self-pay | Admitting: Gastroenterology

## 2022-10-29 MED ORDER — CYCLOBENZAPRINE HCL 10 MG PO TABS
10.0000 mg | ORAL_TABLET | Freq: Three times a day (TID) | ORAL | 0 refills | Status: DC | PRN
  Filled 2022-10-29: qty 30, 10d supply, fill #0

## 2022-10-29 NOTE — Telephone Encounter (Signed)
Requesting rx rf of  Cyclobenzaprine 10mg   Last written by Pecolia Ades, NP  on 10/20/2022 Last OV with Pecolia Ades, NP 10/20/2022 No scheduled upcoming apptointments.

## 2022-10-29 NOTE — Telephone Encounter (Signed)
Message sent to patient via Mychart that prescription refill has been sent.

## 2022-10-30 LAB — GI PROFILE, STOOL, PCR

## 2022-11-04 ENCOUNTER — Encounter (HOSPITAL_COMMUNITY)
Admission: RE | Disposition: A | Discharge status: Home / Self Care | Payer: Self-pay | Source: Ambulatory Visit | Attending: Gastroenterology

## 2022-11-04 ENCOUNTER — Ambulatory Visit (HOSPITAL_COMMUNITY): Admit: 2022-11-04 | Payer: BC Managed Care – PPO | Admitting: Gastroenterology

## 2022-11-04 ENCOUNTER — Encounter (HOSPITAL_COMMUNITY): Payer: Self-pay | Admitting: Gastroenterology

## 2022-11-04 ENCOUNTER — Ambulatory Visit (HOSPITAL_COMMUNITY): Payer: BC Managed Care – PPO | Admitting: Certified Registered Nurse Anesthetist

## 2022-11-04 ENCOUNTER — Other Ambulatory Visit: Payer: Self-pay

## 2022-11-04 ENCOUNTER — Encounter (HOSPITAL_COMMUNITY): Payer: Self-pay

## 2022-11-04 ENCOUNTER — Ambulatory Visit (HOSPITAL_COMMUNITY)
Admission: RE | Admit: 2022-11-04 | Discharge: 2022-11-04 | Disposition: A | Discharge status: Home / Self Care | Payer: BC Managed Care – PPO | Source: Ambulatory Visit | Attending: Gastroenterology | Admitting: Gastroenterology

## 2022-11-04 DIAGNOSIS — R319 Hematuria, unspecified: Secondary | ICD-10-CM | POA: Diagnosis not present

## 2022-11-04 DIAGNOSIS — R1110 Vomiting, unspecified: Secondary | ICD-10-CM | POA: Diagnosis present

## 2022-11-04 DIAGNOSIS — K625 Hemorrhage of anus and rectum: Secondary | ICD-10-CM

## 2022-11-04 DIAGNOSIS — K635 Polyp of colon: Secondary | ICD-10-CM | POA: Diagnosis not present

## 2022-11-04 DIAGNOSIS — R112 Nausea with vomiting, unspecified: Secondary | ICD-10-CM | POA: Diagnosis not present

## 2022-11-04 DIAGNOSIS — K529 Noninfective gastroenteritis and colitis, unspecified: Secondary | ICD-10-CM | POA: Insufficient documentation

## 2022-11-04 DIAGNOSIS — K621 Rectal polyp: Secondary | ICD-10-CM | POA: Diagnosis not present

## 2022-11-04 DIAGNOSIS — K3189 Other diseases of stomach and duodenum: Secondary | ICD-10-CM | POA: Diagnosis not present

## 2022-11-04 DIAGNOSIS — Z8379 Family history of other diseases of the digestive system: Secondary | ICD-10-CM | POA: Diagnosis not present

## 2022-11-04 DIAGNOSIS — Z87891 Personal history of nicotine dependence: Secondary | ICD-10-CM | POA: Diagnosis not present

## 2022-11-04 DIAGNOSIS — R197 Diarrhea, unspecified: Secondary | ICD-10-CM | POA: Insufficient documentation

## 2022-11-04 DIAGNOSIS — Z6841 Body Mass Index (BMI) 40.0 and over, adult: Secondary | ICD-10-CM | POA: Insufficient documentation

## 2022-11-04 DIAGNOSIS — K219 Gastro-esophageal reflux disease without esophagitis: Secondary | ICD-10-CM | POA: Diagnosis not present

## 2022-11-04 DIAGNOSIS — R109 Unspecified abdominal pain: Secondary | ICD-10-CM | POA: Diagnosis not present

## 2022-11-04 DIAGNOSIS — F419 Anxiety disorder, unspecified: Secondary | ICD-10-CM | POA: Diagnosis not present

## 2022-11-04 DIAGNOSIS — K319 Disease of stomach and duodenum, unspecified: Secondary | ICD-10-CM | POA: Diagnosis not present

## 2022-11-04 HISTORY — PX: COLONOSCOPY WITH PROPOFOL: SHX5780

## 2022-11-04 HISTORY — PX: BIOPSY: SHX5522

## 2022-11-04 HISTORY — PX: ESOPHAGOGASTRODUODENOSCOPY (EGD) WITH PROPOFOL: SHX5813

## 2022-11-04 LAB — PREGNANCY, URINE: Preg Test, Ur: NEGATIVE

## 2022-11-04 SURGERY — COLONOSCOPY WITH PROPOFOL
Anesthesia: Monitor Anesthesia Care

## 2022-11-04 SURGERY — ESOPHAGOGASTRODUODENOSCOPY (EGD) WITH PROPOFOL
Anesthesia: Monitor Anesthesia Care

## 2022-11-04 MED ORDER — LIDOCAINE 2% (20 MG/ML) 5 ML SYRINGE
INTRAMUSCULAR | Status: DC | PRN
  Administered 2022-11-04: 100 mg via INTRAVENOUS

## 2022-11-04 MED ORDER — ONDANSETRON HCL 4 MG/2ML IJ SOLN
INTRAMUSCULAR | Status: DC | PRN
  Administered 2022-11-04: 4 mg via INTRAVENOUS

## 2022-11-04 MED ORDER — PROPOFOL 500 MG/50ML IV EMUL
INTRAVENOUS | Status: AC
  Filled 2022-11-04: qty 50

## 2022-11-04 MED ORDER — PROPOFOL 10 MG/ML IV BOLUS
INTRAVENOUS | Status: DC | PRN
  Administered 2022-11-04: 30 mg via INTRAVENOUS
  Administered 2022-11-04: 10 mg via INTRAVENOUS

## 2022-11-04 MED ORDER — PROPOFOL 500 MG/50ML IV EMUL
INTRAVENOUS | Status: DC | PRN
  Administered 2022-11-04: 125 ug/kg/min via INTRAVENOUS

## 2022-11-04 MED ORDER — PROPOFOL 1000 MG/100ML IV EMUL
INTRAVENOUS | Status: AC
  Filled 2022-11-04: qty 100

## 2022-11-04 MED ORDER — LACTATED RINGERS IV SOLN: INTRAVENOUS | Status: DC | PRN

## 2022-11-04 MED ORDER — SODIUM CHLORIDE 0.9 % IV SOLN: INTRAVENOUS | Status: DC

## 2022-11-04 MED ORDER — LACTATED RINGERS IV SOLN: Freq: Once | INTRAVENOUS | Status: AC

## 2022-11-04 SURGICAL SUPPLY — 25 items

## 2022-11-04 NOTE — H&P (Signed)
Referring Provider: No ref. provider found Primary Care Physician:  Donella Stade, PA-C   Indication for EGD:  Diarrhea, nausea, vomiting Indication for Colonoscopy: Diarrhea, rectal bleeding  IMPRESSION:  Diarrhea Nausea and vomiting   PLAN: EGD and colonoscopy   HPI: Emma Boyer is a 27 y.o. female presents for endoscopic evaluation of nausea, vomiting, and and diarrhea.  She has associated rectal bleeding, urgency, and occasional nocturnal symptoms.  She states last two years has had IBS in her opinion.  She has had 4 Bm's today, on average has 3-4 a day.  Rare nocturnal symptoms, can wake up at 2 AM with BM, last time a month ago.  She has a lot of urgency with the Bm's.  Had rectal bleeding once a week for last 3 months, in the toilet, TP, some rectal pain rare, BRB to dark red blood, moderate to large volume.  Imodium can help some.  No AB pain, can have AB bloating/tightness.  Has had nausea and vomiting x 2 years, zofran has not helped, reglan helped some. Nausea is worse in the morning with waking up, at least once a week.  She use to have GERD, since being on prilosec 40 mg daily for 1-2 years no GERD.  No dysphagia, no melena.  She is on reglan as needed, potentially every other day.  She works from home and it has interfered with her work.  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   Denies rashes/joint.    No NSAIDS, ETOH once a week, no drug use.  Patient has been on zoloft 200mg  for 2-3 years, was on prozac prior, on trazodone 100mg ,  She can have sweating a lot, never feels flushing. Occ palpitations and anxiety. No family history of colon cancer, mother with colitis but uncertain what time.  No weight loss.      Past Medical History:  Diagnosis Date   ADHD    Anxiety    Benign paroxysmal positional vertigo    Bulging lumbar disc 08/16/2012   Dry eye syndrome    HA (headache)    History of anal fissures    Low back pain    Neck  pain    Pneumonia    Pseudopapilledema    Shoulder pain    Vision abnormalities    wears contacts, right eye worse    Past Surgical History:  Procedure Laterality Date   SUBOCCIPITAL CRANIECTOMY CERVICAL LAMINECTOMY N/A 05/01/2013   Procedure: Chiari Decompression;  Surgeon: Erline Levine, MD;  Location: [facility];  Service: Neurosurgery;  Laterality: N/A;  Chiari Decompression    No current facility-administered medications for this encounter.    Allergies as of 10/05/2022 - Review Complete 10/04/2022  Allergen Reaction Noted   Peanut-containing drug products Swelling and Other (See Comments) 09/08/2012    Family History  Problem Relation Age of Onset   Anxiety disorder Mother    Depression Mother    High blood pressure Father    Arthritis Father    Anxiety disorder Sister    Depression Sister    Physical abuse Sister    Sexual abuse Sister    Anxiety disorder Maternal Grandfather    Dementia Maternal Grandfather    Depression Maternal Grandfather    ADD / ADHD Maternal Aunt    Anxiety disorder Maternal Aunt    Depression Maternal Aunt    Colon cancer Neg Hx    Stomach cancer Neg Hx    Esophageal cancer Neg Hx  Physical Exam: General:   Alert,  well-nourished, pleasant and cooperative in NAD Head:  Normocephalic and atraumatic. Eyes:  Sclera clear, no icterus.   Conjunctiva pink. Mouth:  No deformity or lesions.   Neck:  Supple; no masses or thyromegaly. Lungs:  Clear throughout to auscultation.   No wheezes. Heart:  Regular rate and rhythm; no murmurs. Abdomen:  Soft, non-tender, nondistended, normal bowel sounds, no rebound or guarding.  Msk:  Symmetrical. No boney deformities LAD: No inguinal or umbilical LAD Extremities:  No clubbing or edema. Neurologic:  Alert and  oriented x4;  grossly nonfocal Skin:  No obvious rash or bruise. Psych:  Alert and cooperative. Normal mood and affect.     Studies/Results: No results found.    Briannah Lona L.  Tarri Glenn, MD, MPH 11/04/2022, 8:32 AM

## 2022-11-04 NOTE — Anesthesia Postprocedure Evaluation (Signed)
Anesthesia Post Note  Patient: MATILDA WALDON  Procedure(s) Performed: COLONOSCOPY WITH PROPOFOL ESOPHAGOGASTRODUODENOSCOPY (EGD) WITH PROPOFOL BIOPSY     Anesthesia Type: MAC Anesthetic complications: no   No notable events documented.  Last Vitals:  Vitals:   11/04/22 1030 11/04/22 1040  BP: (!) 137/95 139/77  Pulse: 98 88  Resp: (!) 22 13  Temp:    SpO2: 100% 100%    Last Pain:  Vitals:   11/04/22 1030  TempSrc:   PainSc: 0-No pain                 Audry Pili

## 2022-11-04 NOTE — Op Note (Signed)
[facility] Patient Name: Emma Boyer Procedure Date: 11/04/2022 MRN: [id number] Attending MD: Thornton Park MD, MD, [id number] Date of Birth: 04/27/1996 CSN: [id number] Age: 27 Admit Type: Outpatient Procedure:                Colonoscopy Indications:              Abdominal pain, Family history of inflammatory                            bowel disease in a first-degree relative (mother                            with colitis NOS), Chronic diarrhea Providers:                Thornton Park MD, MD, Jamison Neighbor RN, RN,                            Darliss Cheney, Technician Referring MD:              Medicines:                Monitored Anesthesia Care Complications:            No immediate complications. Estimated Blood Loss:     Estimated blood loss was minimal. Procedure:                Pre-Anesthesia Assessment:                           - Prior to the procedure, a History and Physical                            was performed, and patient medications and                            allergies were reviewed. The patient's tolerance of                            previous anesthesia was also reviewed. The risks                            and benefits of the procedure and the sedation                            options and risks were discussed with the patient.                            All questions were answered, and informed consent                            was obtained. Prior Anticoagulants: The patient has                            taken no anticoagulant or antiplatelet agents. ASA  Grade Assessment: III - A patient with severe                            systemic disease. After reviewing the risks and                            benefits, the patient was deemed in satisfactory                            condition to undergo the procedure.                           After obtaining informed consent, the colonoscope                             was passed under direct vision. Throughout the                            procedure, the patient's blood pressure, pulse, and                            oxygen saturations were monitored continuously. The                            [id number]) Olympus colonoscope was                            introduced through the anus and advanced to the 5                            cm into the ileum. A second forward view of the                            right colon was performed. The colonoscopy was                            performed without difficulty. The patient tolerated                            the procedure well. The quality of the bowel                            preparation was good. The terminal ileum, ileocecal                            valve, appendiceal orifice, and rectum were                            photographed. Scope In: 9:57:57 AM Scope Out: 10:10:44 AM Scope Withdrawal Time: 0 hours 10 minutes 4 seconds  Total Procedure Duration: 0 hours 12 minutes 47 seconds  Findings:      The perianal and digital rectal examinations were normal.      Two sessile polyps were found in the rectum. The  polyps were 2 to 3 mm       in size. These polyps were removed with a cold biopsy forceps. Resection       and retrieval were complete. Estimated blood loss was minimal.      The colon (entire examined portion) otherwise appeared normal. Biopsies       were taken throughout the colon with a cold forceps for histology.       Estimated blood loss was minimal.      The terminal ileum appeared normal. Biopsies were taken with a cold       forceps for histology. Estimated blood loss was minimal.      The exam was otherwise without abnormality on direct and retroflexion       views. Impression:               - Two 2 to 3 mm polyps in the rectum, removed with                            a cold biopsy forceps. Resected and retrieved.                           - The entire examined colon is  normal. Biopsied.                           - The examined portion of the ileum was normal.                            Biopsied.                           - The examination was otherwise normal on direct                            and retroflexion views. Moderate Sedation:      Not Applicable - Patient had care per Anesthesia. Recommendation:           - Patient has a contact number available for                            emergencies. The signs and symptoms of potential                            delayed complications were discussed with the                            patient. Return to normal activities tomorrow.                            Written discharge instructions were provided to the                            patient.                           - Resume previous diet.                           -  Continue present medications.                           - Await pathology results.                           - Return to GI office to review these results and                            ongoing symptoms. Procedure Code(s):        --- Professional ---                           [phone], Colonoscopy, flexible; with biopsy, single                            or multiple Diagnosis Code(s):        --- Professional ---                           D12.8, Benign neoplasm of rectum                           R10.9, Unspecified abdominal pain                           Z83.79, Family history of other diseases of the                            digestive system                           K52.9, Noninfective gastroenteritis and colitis,                            unspecified CPT copyright 2022 American Medical Association. All rights reserved. The codes documented in this report are preliminary and upon coder review may  be revised to meet current compliance requirements. Thornton Park MD, MD 11/04/2022 10:27:10 AM This report has been signed electronically. Number of Addenda: 0

## 2022-11-04 NOTE — Discharge Instructions (Signed)
YOU HAD AN ENDOSCOPIC PROCEDURE TODAY: Refer to the procedure report and other information in the discharge instructions given to you for any specific questions about what was found during the examination. If this information does not answer your questions, please call [place] office at [phone] to clarify.  ° °YOU SHOULD EXPECT: Some feelings of bloating in the abdomen. Passage of more gas than usual. Walking can help get rid of the air that was put into your GI tract during the procedure and reduce the bloating. If you had a lower endoscopy (such as a colonoscopy or flexible sigmoidoscopy) you may notice spotting of blood in your stool or on the toilet paper. Some abdominal soreness may be present for a day or two, also. ° °DIET: Your first meal following the procedure should be a light meal and then it is ok to progress to your normal diet. A half-sandwich or bowl of soup is an example of a good first meal. Heavy or fried foods are harder to digest and may make you feel nauseous or bloated. Drink plenty of fluids but you should avoid alcoholic beverages for 24 hours. If you had a esophageal dilation, please see attached instructions for diet.   ° °ACTIVITY: Your care partner should take you home directly after the procedure. You should plan to take it easy, moving slowly for the rest of the day. You can resume normal activity the day after the procedure however YOU SHOULD NOT DRIVE, use power tools, machinery or perform tasks that involve climbing or major physical exertion for 24 hours (because of the sedation medicines used during the test).  ° °SYMPTOMS TO REPORT IMMEDIATELY: °A gastroenterologist can be reached at any hour. Please call [phone]  for any of the following symptoms:  °Following lower endoscopy (colonoscopy, flexible sigmoidoscopy) °Excessive amounts of blood in the stool  °Significant tenderness, worsening of abdominal pains  °Swelling of the abdomen that is new, acute  °Fever of 100° or  higher  °Following upper endoscopy (EGD, EUS, ERCP, esophageal dilation) °Vomiting of blood or coffee ground material  °New, significant abdominal pain  °New, significant chest pain or pain under the shoulder blades  °Painful or persistently difficult swallowing  °New shortness of breath  °Black, tarry-looking or red, bloody stools ° °FOLLOW UP:  °If any biopsies were taken you will be contacted by phone or by letter within the next 1-3 weeks. Call [phone]  if you have not heard about the biopsies in 3 weeks.  °Please also call with any specific questions about appointments or follow up tests. ° °

## 2022-11-04 NOTE — Op Note (Addendum)
[facility] Patient Name: Emma Boyer Procedure Date: 11/04/2022 MRN: [id number] Attending MD: Thornton Park MD, MD, [id number] Date of Birth: 01/02/1996 CSN: [id number] Age: 27 Admit Type: Outpatient Procedure:                Upper GI endoscopy Indications:              Abdominal pain, Diarrhea, Nausea with vomiting Providers:                Thornton Park MD, MD, Jamison Neighbor RN, RN,                            Darliss Cheney, Technician Referring MD:              Medicines:                Monitored Anesthesia Care Complications:            No immediate complications. Estimated Blood Loss:     Estimated blood loss was minimal. Procedure:                Pre-Anesthesia Assessment:                           - Prior to the procedure, a History and Physical                            was performed, and patient medications and                            allergies were reviewed. The patient's tolerance of                            previous anesthesia was also reviewed. The risks                            and benefits of the procedure and the sedation                            options and risks were discussed with the patient.                            All questions were answered, and informed consent                            was obtained. Prior Anticoagulants: The patient has                            taken no anticoagulant or antiplatelet agents. ASA                            Grade Assessment: III - A patient with severe                            systemic disease. After reviewing the risks and  benefits, the patient was deemed in satisfactory                            condition to undergo the procedure.                           After obtaining informed consent, the endoscope was                            passed under direct vision. Throughout the                            procedure, the patient's blood pressure, pulse, and                             oxygen saturations were monitored continuously. The                            [id number] Olympus endoscope was introduced                            through the mouth, and advanced to the second part                            of duodenum. The upper GI endoscopy was                            accomplished without difficulty. The patient                            tolerated the procedure well. Scope In: Scope Out: Findings:      Blood was present in the oropharynx related to some trauma that occured       in the left nares from the tubing used for oxygen delivery and       monitoring during the procedure. No active bleeding seen at the time of       the procedure. The examined portions of the nasopharynx, oropharynx and       larynx were otherwise normal.      The examined esophagus was normal. There is mild congestion at the       z-line. Biopsies were taken from the mid/proximal and distal with a cold       forceps for histology. Estimated blood loss was minimal.      The entire examined stomach was normal except for very mild erythema.       Biopsies were taken from the antrum, body, and fundus with a cold       forceps for histology. Estimated blood loss was minimal.      The examined duodenum was normal. Biopsies were taken with a cold       forceps for histology. Estimated blood loss was minimal. Impression:               - Blood in the mouth related to bleeding from  trauma to the left nares from anesthesia equipment.                           - Normal esophagus. Biopsied.                           - Normal stomach except for mild erythema. Biopsied.                           - Normal examined duodenum. Biopsied. Moderate Sedation:      Not Applicable - Patient had care per Anesthesia. Recommendation:           - Patient has a contact number available for                            emergencies. The signs and symptoms of  potential                            delayed complications were discussed with the                            patient. Return to normal activities tomorrow.                            Written discharge instructions were provided to the                            patient.                           - Resume previous diet.                           - Continue present medications.                           - Await pathology results. Procedure Code(s):        --- Professional ---                           [phone], Esophagogastroduodenoscopy, flexible,                            transoral; with biopsy, single or multiple Diagnosis Code(s):        --- Professional ---                           R10.9, Unspecified abdominal pain                           R19.7, Diarrhea, unspecified                           R11.2, Nausea with vomiting, unspecified CPT copyright 2022 American [employer] Association. All rights reserved. The codes documented in this report are preliminary and upon coder review may  be revised to meet current compliance requirements. Thornton Park MD, MD  11/04/2022 10:23:19 AM This report has been signed electronically. Number of Addenda: 0

## 2022-11-04 NOTE — Transfer of Care (Signed)
Immediate Anesthesia Transfer of Care Note  Patient: Emma Boyer  Procedure(s) Performed: COLONOSCOPY WITH PROPOFOL ESOPHAGOGASTRODUODENOSCOPY (EGD) WITH PROPOFOL BIOPSY  Patient Location: PACU and Endoscopy Unit  Anesthesia Type:MAC  Level of Consciousness: awake, alert , and oriented  Airway & Oxygen Therapy: Patient Spontanous Breathing and Patient connected to face mask oxygen  Post-op Assessment: Report given to RN and Post -op Vital signs reviewed and stable  Post vital signs: Reviewed and stable  Last Vitals:  Vitals Value Taken Time  BP    Temp    Pulse 104 11/04/22 1019  Resp    SpO2 100 % 11/04/22 1019  Vitals shown include unvalidated device data.  Last Pain:  Vitals:   11/04/22 0847  TempSrc: Temporal  PainSc: 0-No pain         Complications: No notable events documented.

## 2022-11-04 NOTE — Anesthesia Preprocedure Evaluation (Addendum)
Anesthesia Evaluation  Patient identified by MRN, date of birth, ID band Patient awake    Reviewed: Allergy & Precautions, NPO status , Patient's Chart, lab work & pertinent test results  History of Anesthesia Complications Negative for: history of anesthetic complications  Airway Mallampati: III  TM Distance: >3 FB Neck ROM: Full    Dental  (+) Dental Advisory Given   Pulmonary former smoker   Pulmonary exam normal        Cardiovascular negative cardio ROS Normal cardiovascular exam     Neuro/Psych  Headaches PSYCHIATRIC DISORDERS Anxiety      Chiari malformation type 1 BPPV     GI/Hepatic Neg liver ROS,GERD  Medicated and Controlled,,  Endo/Other    Morbid obesity  Renal/GU negative Renal ROS     Musculoskeletal negative musculoskeletal ROS (+)    Abdominal  (+) + obese  Peds  (+) ADHD Hematology negative hematology ROS (+)   Anesthesia Other Findings   Reproductive/Obstetrics                             Anesthesia Physical Anesthesia Plan  ASA: 3  Anesthesia Plan: MAC   Post-op Pain Management: Minimal or no pain anticipated   Induction:   PONV Risk Score and Plan: 2 and Propofol infusion and Treatment may vary due to age or medical condition  Airway Management Planned: Nasal Cannula and Natural Airway  Additional Equipment: None  Intra-op Plan:   Post-operative Plan:   Informed Consent: I have reviewed the patients History and Physical, chart, labs and discussed the procedure including the risks, benefits and alternatives for the proposed anesthesia with the patient or authorized representative who has indicated his/her understanding and acceptance.       Plan Discussed with: CRNA and Anesthesiologist  Anesthesia Plan Comments:        Anesthesia Quick Evaluation

## 2022-11-04 NOTE — Anesthesia Postprocedure Evaluation (Signed)
Anesthesia Post Note  Patient: Emma Boyer  Procedure(s) Performed: COLONOSCOPY WITH PROPOFOL ESOPHAGOGASTRODUODENOSCOPY (EGD) WITH PROPOFOL BIOPSY     Patient location during evaluation: PACU Anesthesia Type: MAC Level of consciousness: awake and alert Pain management: pain level controlled Vital Signs Assessment: post-procedure vital signs reviewed and stable Respiratory status: spontaneous breathing, nonlabored ventilation and respiratory function stable Cardiovascular status: stable and blood pressure returned to baseline Anesthetic complications: no   No notable events documented.  Last Vitals:  Vitals:   11/04/22 1030 11/04/22 1040  BP: (!) 137/95 139/77  Pulse: 98 88  Resp: (!) 22 13  Temp:    SpO2: 100% 100%    Last Pain:  Vitals:   11/04/22 1030  TempSrc:   PainSc: 0-No pain                 Audry Pili

## 2022-11-05 ENCOUNTER — Encounter (HOSPITAL_COMMUNITY): Payer: Self-pay | Admitting: Gastroenterology

## 2022-11-05 LAB — SURGICAL PATHOLOGY

## 2022-11-10 ENCOUNTER — Other Ambulatory Visit (HOSPITAL_COMMUNITY): Payer: Self-pay

## 2022-11-23 ENCOUNTER — Ambulatory Visit: Payer: BC Managed Care – PPO | Admitting: Physical Therapy

## 2022-11-24 ENCOUNTER — Telehealth: Payer: Self-pay | Admitting: *Deleted

## 2022-11-24 NOTE — Telephone Encounter (Signed)
Scheduled procedure follow up with Marchelle Folks, PA on 01/20/23

## 2022-11-24 NOTE — Telephone Encounter (Signed)
-----   Message from Tressia Danas, MD sent at 11/04/2022 10:16 AM EDT ----- Please arrange for office follow-up with Marchelle Folks - the next available appointment.  Thank you

## 2022-11-25 NOTE — Telephone Encounter (Signed)
Patient called requesting a call back to discuss her colonoscopy results. Please advise.

## 2022-11-25 NOTE — Telephone Encounter (Signed)
The pt has been advised that Dr Orvan Falconer has not reviewed the path results as of today. We will contact her as soon as able.

## 2022-11-28 ENCOUNTER — Other Ambulatory Visit: Payer: Self-pay | Admitting: Physician Assistant

## 2022-11-28 DIAGNOSIS — F902 Attention-deficit hyperactivity disorder, combined type: Secondary | ICD-10-CM

## 2022-11-28 DIAGNOSIS — R41840 Attention and concentration deficit: Secondary | ICD-10-CM

## 2022-11-29 ENCOUNTER — Other Ambulatory Visit: Payer: Self-pay

## 2022-11-29 ENCOUNTER — Other Ambulatory Visit (HOSPITAL_BASED_OUTPATIENT_CLINIC_OR_DEPARTMENT_OTHER): Payer: Self-pay

## 2022-11-29 NOTE — Telephone Encounter (Signed)
Please contact the patient to schedule an appointment to follow up on the methylphenidate rx. Thanks in advance.

## 2022-11-29 NOTE — Telephone Encounter (Signed)
Last OV: 06/09/22 Next OV: no appt scheduled Last RF: 10/08/22

## 2022-11-29 NOTE — Telephone Encounter (Signed)
Called patient left vm to call back and schedule an appointment

## 2022-11-29 NOTE — Telephone Encounter (Signed)
Needs appt

## 2022-11-30 ENCOUNTER — Other Ambulatory Visit (HOSPITAL_BASED_OUTPATIENT_CLINIC_OR_DEPARTMENT_OTHER): Payer: Self-pay

## 2022-11-30 ENCOUNTER — Ambulatory Visit: Payer: BC Managed Care – PPO | Admitting: Physical Therapy

## 2022-12-03 ENCOUNTER — Ambulatory Visit: Payer: BC Managed Care – PPO | Admitting: Orthopaedic Surgery

## 2022-12-06 ENCOUNTER — Ambulatory Visit (INDEPENDENT_AMBULATORY_CARE_PROVIDER_SITE_OTHER): Payer: BC Managed Care – PPO | Admitting: Physician Assistant

## 2022-12-06 ENCOUNTER — Other Ambulatory Visit (HOSPITAL_COMMUNITY): Payer: Self-pay

## 2022-12-06 ENCOUNTER — Other Ambulatory Visit (HOSPITAL_BASED_OUTPATIENT_CLINIC_OR_DEPARTMENT_OTHER): Payer: Self-pay

## 2022-12-06 ENCOUNTER — Telehealth: Payer: Self-pay | Admitting: Family Medicine

## 2022-12-06 ENCOUNTER — Encounter: Payer: Self-pay | Admitting: Physician Assistant

## 2022-12-06 VITALS — BP 129/91 | HR 106 | Ht 67.0 in | Wt 353.0 lb

## 2022-12-06 DIAGNOSIS — K580 Irritable bowel syndrome with diarrhea: Secondary | ICD-10-CM

## 2022-12-06 DIAGNOSIS — G4700 Insomnia, unspecified: Secondary | ICD-10-CM

## 2022-12-06 DIAGNOSIS — F902 Attention-deficit hyperactivity disorder, combined type: Secondary | ICD-10-CM

## 2022-12-06 DIAGNOSIS — R197 Diarrhea, unspecified: Secondary | ICD-10-CM

## 2022-12-06 DIAGNOSIS — F419 Anxiety disorder, unspecified: Secondary | ICD-10-CM | POA: Diagnosis not present

## 2022-12-06 DIAGNOSIS — R41840 Attention and concentration deficit: Secondary | ICD-10-CM | POA: Diagnosis not present

## 2022-12-06 DIAGNOSIS — R1030 Lower abdominal pain, unspecified: Secondary | ICD-10-CM

## 2022-12-06 DIAGNOSIS — E7800 Pure hypercholesterolemia, unspecified: Secondary | ICD-10-CM | POA: Diagnosis not present

## 2022-12-06 DIAGNOSIS — R7982 Elevated C-reactive protein (CRP): Secondary | ICD-10-CM

## 2022-12-06 DIAGNOSIS — R140 Abdominal distension (gaseous): Secondary | ICD-10-CM

## 2022-12-06 DIAGNOSIS — R700 Elevated erythrocyte sedimentation rate: Secondary | ICD-10-CM

## 2022-12-06 MED ORDER — SERTRALINE HCL 100 MG PO TABS
200.0000 mg | ORAL_TABLET | Freq: Every morning | ORAL | 1 refills | Status: DC
  Filled 2023-01-23: qty 60, 30d supply, fill #0
  Filled 2023-02-18: qty 60, 30d supply, fill #1
  Filled 2023-03-20: qty 60, 30d supply, fill #2
  Filled 2023-06-22: qty 60, 30d supply, fill #3
  Filled 2023-07-21: qty 60, 30d supply, fill #4

## 2022-12-06 MED ORDER — METHYLPHENIDATE HCL ER 20 MG PO TBCR
20.0000 mg | EXTENDED_RELEASE_TABLET | Freq: Every day | ORAL | 0 refills | Status: DC
  Filled 2022-12-06: qty 30, 30d supply, fill #0

## 2022-12-06 MED ORDER — METHYLPHENIDATE HCL ER 20 MG PO TBCR
20.0000 mg | EXTENDED_RELEASE_TABLET | Freq: Every day | ORAL | 0 refills | Status: DC
  Filled 2023-01-14: qty 30, 30d supply, fill #0

## 2022-12-06 MED ORDER — TRAZODONE HCL 50 MG PO TABS
100.0000 mg | ORAL_TABLET | Freq: Every day | ORAL | 3 refills | Status: DC
  Filled 2022-12-06: qty 60, 30d supply, fill #0
  Filled 2022-12-08: qty 90, 45d supply, fill #0
  Filled 2023-01-23: qty 60, 30d supply, fill #1
  Filled 2023-02-18: qty 60, 30d supply, fill #2
  Filled 2023-03-20: qty 60, 30d supply, fill #3
  Filled 2023-04-19: qty 60, 30d supply, fill #4

## 2022-12-06 MED ORDER — RIFAXIMIN 550 MG PO TABS
550.0000 mg | ORAL_TABLET | Freq: Three times a day (TID) | ORAL | 0 refills | Status: DC
  Filled 2022-12-06: qty 42, 14d supply, fill #0

## 2022-12-06 MED ORDER — METHYLPHENIDATE HCL ER 20 MG PO TBCR
20.0000 mg | EXTENDED_RELEASE_TABLET | Freq: Every day | ORAL | 0 refills | Status: DC
  Filled 2023-02-18: qty 30, 30d supply, fill #0

## 2022-12-06 NOTE — Telephone Encounter (Signed)
Meds ordered this encounter  Medications   methylphenidate (METADATE ER) 20 MG ER tablet    Sig: Take 1 tablet (20 mg total) by mouth daily.    Dispense:  30 tablet    Refill:  0

## 2022-12-06 NOTE — Progress Notes (Unsigned)
Established Patient Office Visit  Subjective   Patient ID: Emma Boyer, female    DOB: 1995/11/26  Age: 27 y.o. MRN: [id number]  Chief Complaint  Patient presents with   Diarrhea   Abdominal Pain    HPI Patient is a 27 year old obese female with depression, anxiety, ADHD who presents to the clinic for follow-up and to discuss ongoing diarrhea, lower abdominal pain, cramping, bloody and mucousy stools.  Patient has been seen by gastroenterology with [place] but has been frustrated by not getting results and feeling like she does not know what is going on.  She would like for me to review labs and procedures and explained this to her.    She feels very overwhelmed and increased anxiety around not knowing what is going on.   Patient continues to have 4-5 bowel movements a day.  She has intermittent lower abdominal cramping and pain. She has intermittent blood in stool and mucus in stool. She is currently on FMLA for intermittent leave. She is often bloated and abdomen hard after eating.   Her lab on 2/15 showed elevated ESR, CRP, WBC, IgA. She had negative GI pathogen panel.   See colonoscopy/endoscopy results:   FINAL MICROSCOPIC DIAGNOSIS:  A. DUODENUM, BIOPSY: - Duodenal mucosa with no specific histopathologic changes - Negative for increased intraepithelial lymphocytes or villous architectural changes  B. STOMACH, ANTRUM, BODY AND FUNDUS, BIOPSY: - Gastric antral mucosa with mild nonspecific reactive gastropathy - Gastric oxyntic mucosa with parietal cell hyperplasia as can be seen in hypergastrinemic states such as PPI therapy. - Helicobacter pylori-like organisms are not identified on routine HE stain  C. ESOPHAGUS, DISTAL, BIOPSY: - Esophageal squamous mucosa with no specific histopathologic changes - Negative for increased intraepithelial eosinophils  D. ESOPHAGUS, MID AND PROXIMAL, BIOPSY: - Esophageal squamous mucosa with no specific histopathologic changes -  Negative for increased intraepithelial eosinophils  E. TERMINAL ILEUM, BIOPSY: - Ileal mucosa with no specific histopathologic changes - Negative for acute inflammation, features of chronicity or granulomas  F. COLON, RANDOM, BIOPSY: - Colonic mucosa with no specific histopathologic changes - Negative for acute inflammation, increased intraepithelial lymphocytes or thickened subepithelial collagen table  G. 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   Pseudopapilledema    Shoulder pain    Acute right-sided low back pain without sciatica 04/20/2013   Abnormal weight gain 01/27/2015   Migraine headache 01/27/2015   History of migraine headaches 08/29/2015   De Quervain's tenosynovitis, bilateral 11/21/2015   Hemoptysis 09/07/2016   Temporomandibular joint arthralgia, right 04/25/2017   H/O degenerative disc disease 02/07/2019   Acute suppurative inflammation of sphenoidal sinus 10/13/2020   Empty sella turcica 10/13/2020   Benign paroxysmal positional vertigo, right 10/13/2020   Insomnia 10/13/2020   Anxiety 10/13/2020   Nausea and vomiting 04/15/2021   Class 3 severe obesity due to excess calories without serious comorbidity with body mass index (BMI) of 50.0 to 59.9 in adult 04/15/2021   Gastroesophageal reflux disease 04/15/2021   Strain of lumbar region 11/19/2021   Attention deficit hyperactivity disorder (ADHD), combined type 11/22/2021   Elevated blood pressure reading in office without diagnosis of hypertension 10/20/2022   Diarrhea 11/04/2022   Abdominal pain 11/04/2022   Rectal bleeding 11/04/2022   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   Bloating 12/06/2022   ESR raised 12/06/2022   Elevated C-reactive protein (CRP) 12/06/2022  Resolved Ambulatory Problems    Diagnosis Date Noted   OTITIS MEDIA, SEROUS, ACUTE,  LEFT 05/18/2009   Acute pharyngitis 04/28/2010   URI 05/18/2009   Preventive measure 09/08/2012   Cerumen impaction 01/14/2014   Influenza A 09/23/2015   Viral syndrome 10/06/2018   Past Medical History:  Diagnosis Date   ADHD    Benign paroxysmal positional vertigo    Bulging lumbar disc 08/16/2012   Dry eye syndrome    HA (headache)    History of anal fissures    Low back pain    Neck pain    Pneumonia    Vision abnormalities      ROS See HPI.    Objective:     BP (!) 129/91 (BP Location: Right Arm, Patient Position: Sitting, Cuff Size: Large)   Pulse (!) 106   Ht  (1.702 m)   Wt (!) 353 lb (160.1 kg)   SpO2 96%   BMI 55.29 kg/m  BP Readings from Last 3 Encounters:  12/06/22 (!) 129/91  11/04/22 139/77  10/22/22 (!) 148/88   Wt Readings from Last 3 Encounters:  12/06/22 (!) 353 lb (160.1 kg)  11/04/22 (!) 355 lb (161 kg)  10/22/22 (!) 360 lb (163.3 kg)    ..    12/06/2022    2:46 PM 06/09/2022   10:22 AM 11/19/2021    3:48 PM 04/15/2021    1:24 PM 09/06/2019    9:21 AM  Depression screen PHQ 2/9  Decreased Interest 1 1 0 1 0  Down, Depressed, Hopeless 1 0 0 0 0  PHQ - 2 Score 2 1 0 1 0  Altered sleeping 0 0  3 1  Tired, decreased energy Change in appetite 0 0  1 0  Feeling bad or failure about yourself  0 1  0 0  Trouble concentrating 0 3  1 0  Moving slowly or fidgety/restless 0 0  0 0  Suicidal thoughts 0 0  0 0  PHQ-9 Score Difficult doing work/chores Very difficult Somewhat difficult  Somewhat difficult Not difficult at all   ..    12/06/2022    2:47 PM 06/09/2022   10:23 AM 04/15/2021    1:25 PM 09/06/2019    9:21 AM  GAD 7 : Generalized Anxiety Score  Nervous, Anxious, on Edge 0  Control/stop worrying 0 0 0 0  Worry too much - different things 0 1 1 0  Trouble relaxing 1 0 1 0  Restless 0 0 0 0  Easily annoyed or irritable 1 3 0 0  Afraid - awful might happen 0 0 0 0  Total GAD 7 Score 0  Anxiety  Difficulty Very difficult Somewhat difficult Somewhat difficult Not difficult at all      Physical Exam Constitutional:      Appearance: Normal appearance. She is obese.  HENT:     Head: Normocephalic.  Cardiovascular:     Rate and Rhythm: Normal rate.  Pulmonary:     Effort: Pulmonary effort is normal.  Abdominal:     General: Bowel sounds are normal. There is no distension.     Palpations: Abdomen is soft. There is no mass.     Tenderness: There is generalized abdominal tenderness. There is no right CVA tenderness, left CVA tenderness, guarding or rebound.     Hernia: No hernia is present.  Neurological:  General: No focal deficit present.     Mental Status: She is alert.  Psychiatric:     Comments: tearful         Assessment & Plan:  Marland KitchenMarland KitchenTwilia was seen today for diarrhea and abdominal pain.  Diagnoses and all orders for this visit:  Irritable bowel syndrome with diarrhea -     rifaximin (XIFAXAN) 550 MG TABS tablet; Take 1 tablet (550 mg total) by mouth 3 (three) times daily. -     CBC w/Diff/Platelet -     Sed Rate (ESR) -     COMPLETE METABOLIC PANEL WITH GFR -     High sensitivity CRP -     Food Panel II IgG -     Ambulatory referral to Gastroenterology  Difficulty concentrating -     Discontinue: methylphenidate (METADATE ER) 20 MG ER tablet; Take 1 tablet (20 mg total) by mouth daily. -     CBC w/Diff/Platelet -     Sed Rate (ESR) -     COMPLETE METABOLIC PANEL WITH GFR -     High sensitivity CRP -     Food Panel II IgG -     methylphenidate (METADATE ER) 20 MG ER tablet; Take 1 tablet (20 mg total) by mouth daily. -     methylphenidate (METADATE ER) 20 MG ER tablet; Take 1 tablet (20 mg total) by mouth daily.  Attention deficit hyperactivity disorder (ADHD), combined type -     Discontinue: methylphenidate (METADATE ER) 20 MG ER tablet; Take 1 tablet (20 mg total) by mouth daily. -     CBC w/Diff/Platelet -     Sed Rate (ESR) -     COMPLETE  METABOLIC PANEL WITH GFR -     High sensitivity CRP -     Food Panel II IgG -     methylphenidate (METADATE ER) 20 MG ER tablet; Take 1 tablet (20 mg total) by mouth daily. -     methylphenidate (METADATE ER) 20 MG ER tablet; Take 1 tablet (20 mg total) by mouth daily.  Anxiety -     sertraline (ZOLOFT) 100 MG tablet; Take 2 tablets (200 mg total) by mouth in the morning. -     CBC w/Diff/Platelet -     Sed Rate (ESR) -     COMPLETE METABOLIC PANEL WITH GFR -     High sensitivity CRP -     Food Panel II IgG  Diarrhea, unspecified type -     rifaximin (XIFAXAN) 550 MG TABS tablet; Take 1 tablet (550 mg total) by mouth 3 (three) times daily. -     CBC w/Diff/Platelet -     Sed Rate (ESR) -     COMPLETE METABOLIC PANEL WITH GFR -     High sensitivity CRP -     Food Panel II IgG -     Ambulatory referral to Gastroenterology  Bloating -     rifaximin (XIFAXAN) 550 MG TABS tablet; Take 1 tablet (550 mg total) by mouth 3 (three) times daily. -     CBC w/Diff/Platelet -     Sed Rate (ESR) -     COMPLETE METABOLIC PANEL WITH GFR -     High sensitivity CRP -     Food Panel II IgG -     Ambulatory referral to Gastroenterology  Lower abdominal pain -     rifaximin (XIFAXAN) 550 MG TABS tablet; Take 1 tablet (550 mg total) by mouth 3 (three) times  daily. -     CBC w/Diff/Platelet -     Sed Rate (ESR) -     COMPLETE METABOLIC PANEL WITH GFR -     High sensitivity CRP -     Food Panel II IgG -     Ambulatory referral to Gastroenterology  Elevated C-reactive protein (CRP) -     rifaximin (XIFAXAN) 550 MG TABS tablet; Take 1 tablet (550 mg total) by mouth 3 (three) times daily. -     CBC w/Diff/Platelet -     Sed Rate (ESR) -     COMPLETE METABOLIC PANEL WITH GFR -     High sensitivity CRP -     Food Panel II IgG -     Ambulatory referral to Gastroenterology  ESR raised -     rifaximin (XIFAXAN) 550 MG TABS tablet; Take 1 tablet (550 mg total) by mouth 3 (three) times daily. -      CBC w/Diff/Platelet -     Sed Rate (ESR) -     COMPLETE METABOLIC PANEL WITH GFR -     High sensitivity CRP -     Food Panel II IgG -     Ambulatory referral to Gastroenterology  Insomnia, unspecified type -     traZODone (DESYREL) 50 MG tablet; Take 2 tablets (100 mg total) by mouth at bedtime.   PHQ/GAD not to goal but feels like health is effecting this Concerta and zoloft refilled Trazodone refilled for sleep  Discussed results from GI It is concerning that her inflammatory markers and WBC are elevated but no findings significant for crohns,  IBD, h.pylori infection on colonoscopy I do think trial of xifaxin is prudent Discussed medication  Coupon card given Will recheck ESR/CRP/CMP/CBC/food panel Will make another referral for 2nd opinion  Spent 50 minutes reviewing chart and with patient discussing plan and coordinating care.  Return in about 3 months (around 03/07/2023).    Tandy Gaw, PA-C

## 2022-12-07 ENCOUNTER — Encounter: Payer: Self-pay | Admitting: Physician Assistant

## 2022-12-07 ENCOUNTER — Other Ambulatory Visit: Payer: Self-pay

## 2022-12-07 NOTE — Progress Notes (Signed)
Can we see if we can add IgA.

## 2022-12-07 NOTE — Progress Notes (Signed)
Rubi,   Persist WBC elevation.  Sed rate improved but still elevated.  Kidney, liver, glucose looks good.  Referral made to GI.  Some labs still pending.  Let me know if you have any questions and I will give you a call.

## 2022-12-07 NOTE — Progress Notes (Signed)
CRP improved but also still elevated.  Food panel may take a few days.

## 2022-12-07 NOTE — Telephone Encounter (Signed)
Called the patient.  Had just seen PCP and given trial of xiphaxin for IBS-D.  If she can not afford it or it is not effective, will call our office.  Can consider GES but will start with xiphaxin trial.  Will continue to fill out FMLA with goal of being able to improve her symptoms with normal EGD colonoscopy.

## 2022-12-09 ENCOUNTER — Other Ambulatory Visit (HOSPITAL_COMMUNITY): Payer: Self-pay

## 2022-12-09 ENCOUNTER — Other Ambulatory Visit: Payer: Self-pay

## 2022-12-10 ENCOUNTER — Encounter: Payer: Self-pay | Admitting: Physician Assistant

## 2022-12-10 LAB — FOOD PANEL II IGG
APPLE (F49) IGG: <2 ug/mL (ref <2.0)
BANANA (F92) IGG: 5 ug/mL — ABNORMAL HIGH (ref <2.0)
Beef, IgG: 8.8 ug/mL — ABNORMAL HIGH (ref <2.0)
CHICKEN MEAT (F83) IGG: <2 ug/mL (ref <2.0)
Cacao (Chocolate)IgG: <2 ug/mL (ref <2.0)
Casein IgE: <2 ug/mL (ref <2.0)
Corn IgG: 4.4 ug/mL — ABNORMAL HIGH (ref <2.0)
Egg white, IgG: <2 ug/mL (ref <2.0)
Orange IgG: <2 ug/mL (ref <2.0)
POTATO (F35) IGG: <2 ug/mL (ref <2.0)
Soybean IgG: <2 ug/mL (ref <2.0)
Tomato IgG: <2 ug/mL (ref <2.0)
Wheat IgG: 3 ug/mL — ABNORMAL HIGH (ref <2.0)

## 2022-12-10 LAB — COMPLETE METABOLIC PANEL WITH GFR
AG Ratio: 1.5 (calc) (ref 1.0–2.5)
ALT: 10 U/L (ref 6–29)
AST: 10 U/L (ref 10–30)
Albumin: 4.2 g/dL (ref 3.6–5.1)
Alkaline phosphatase (APISO): 78 U/L (ref 31–125)
BUN: 7 mg/dL (ref 7–25)
CO2: 26 mmol/L (ref 20–32)
Calcium: 9.6 mg/dL (ref 8.6–10.2)
Chloride: 105 mmol/L (ref 98–110)
Creat: 0.67 mg/dL (ref 0.50–0.96)
Globulin: 2.8 g/dL (calc) (ref 1.9–3.7)
Glucose, Bld: 82 mg/dL (ref 65–99)
Potassium: 4.2 mmol/L (ref 3.5–5.3)
Sodium: 139 mmol/L (ref 135–146)
Total Bilirubin: 0.4 mg/dL (ref 0.2–1.2)
Total Protein: 7 g/dL (ref 6.1–8.1)
eGFR: 123 mL/min/{1.73_m2} (ref >=60)

## 2022-12-10 LAB — SEDIMENTATION RATE: Sed Rate: 29 mm/h — ABNORMAL HIGH (ref 0–20)

## 2022-12-10 LAB — HIGH SENSITIVITY CRP: hs-CRP: >10 mg/L — ABNORMAL HIGH

## 2022-12-10 LAB — CBC WITH DIFFERENTIAL/PLATELET
Absolute Monocytes: 815 cells/uL (ref 200–950)
Basophils Absolute: 65 cells/uL (ref 0–200)
Basophils Relative: 0.4 %
Eosinophils Absolute: 98 cells/uL (ref 15–500)
Eosinophils Relative: 0.6 %
HCT: 41.6 % (ref 35.0–45.0)
Hemoglobin: 13.4 g/dL (ref 11.7–15.5)
Lymphs Abs: 3553 cells/uL (ref 850–3900)
MCH: 25 pg — ABNORMAL LOW (ref 27.0–33.0)
MCHC: 32.2 g/dL (ref 32.0–36.0)
MCV: 77.8 fL — ABNORMAL LOW (ref 80.0–100.0)
MPV: 11.4 fL (ref 7.5–12.5)
Monocytes Relative: 5 %
Neutro Abs: 11769 cells/uL — ABNORMAL HIGH (ref 1500–7800)
Neutrophils Relative %: 72.2 %
Platelets: 254 10*3/uL (ref 140–400)
RBC: 5.35 10*6/uL — ABNORMAL HIGH (ref 3.80–5.10)
RDW: 14.4 % (ref 11.0–15.0)
Total Lymphocyte: 21.8 %
WBC: 16.3 10*3/uL — ABNORMAL HIGH (ref 3.8–10.8)

## 2022-12-10 LAB — IGA: Immunoglobulin A: 385 mg/dL — ABNORMAL HIGH (ref 47–310)

## 2022-12-10 NOTE — Progress Notes (Signed)
IGA still elevated which can be seen in celiac. Do you have GI appt yet?  Have you tried to 2 week gluten free diet just to see how you feel?

## 2022-12-11 ENCOUNTER — Encounter: Payer: Self-pay | Admitting: Gastroenterology

## 2022-12-14 ENCOUNTER — Other Ambulatory Visit: Payer: Self-pay | Admitting: Physician Assistant

## 2022-12-26 ENCOUNTER — Other Ambulatory Visit: Payer: Self-pay | Admitting: Physician Assistant

## 2022-12-27 ENCOUNTER — Encounter: Payer: Self-pay | Admitting: Physician Assistant

## 2023-01-03 ENCOUNTER — Other Ambulatory Visit (HOSPITAL_BASED_OUTPATIENT_CLINIC_OR_DEPARTMENT_OTHER): Payer: Self-pay

## 2023-01-03 ENCOUNTER — Other Ambulatory Visit: Payer: Self-pay | Admitting: Physician Assistant

## 2023-01-13 ENCOUNTER — Other Ambulatory Visit: Payer: Self-pay | Admitting: Physician Assistant

## 2023-01-14 ENCOUNTER — Other Ambulatory Visit (HOSPITAL_BASED_OUTPATIENT_CLINIC_OR_DEPARTMENT_OTHER): Payer: Self-pay

## 2023-01-14 ENCOUNTER — Other Ambulatory Visit (HOSPITAL_COMMUNITY): Payer: Self-pay

## 2023-01-14 ENCOUNTER — Other Ambulatory Visit: Payer: Self-pay

## 2023-01-17 ENCOUNTER — Encounter: Payer: Self-pay | Admitting: Physician Assistant

## 2023-01-17 ENCOUNTER — Other Ambulatory Visit (HOSPITAL_BASED_OUTPATIENT_CLINIC_OR_DEPARTMENT_OTHER): Payer: Self-pay

## 2023-01-17 MED ORDER — METOCLOPRAMIDE HCL 10 MG PO TABS
10.0000 mg | ORAL_TABLET | Freq: Three times a day (TID) | ORAL | 0 refills | Status: DC | PRN
  Filled 2023-01-17: qty 30, 10d supply, fill #0

## 2023-01-18 NOTE — Progress Notes (Deleted)
01/18/2023 Emma Boyer [id number] 16-Nov-1995  Referring provider: Jomarie Longs, PA-C Primary GI doctor: (Dr. Orvan Falconer)  ASSESSMENT AND PLAN:   Diarrhea for 2 years with associated rectal bleeding, urgency, occ nocturnal symptoms Mom with history of colitis With chronicity will not get infectious labs Check CBC, last checked 2022 Get Sed rate, CRP Check celiac panel Can do trial of IBGARD daily FODMAP, trial off lactulose and lifestyle changes discussed Will schedule for endoscopic evaluation at the hospital due to BMI, discussed with patient and agrees with plan. Consider SIBO testing or xifaxin trial pending results Consider serotonin syndrome and cutting back on some SSRI's  Nausea and vomiting, unspecified vomiting type Improved with reglan, will schedule EGD to evaluate Consider GES if negative  Class 3 severe obesity due to excess calories without serious comorbidity with body mass index (BMI) of 50.0 to 59.9 in adult [facility]) Will need to have done at the hospital due to her BMI   I recommend upper gastrointestinal and colorectal evaluation with an EGD and colonoscopy at the hospital.  Risk of bowel prep, conscious sedation, and EGD and colonoscopy were discussed.  Risks include but are not limited to dehydration, pain, bleeding, cardiopulmonary process, bowel perforation, or other possible adverse outcomes..  Treatment plan was discussed with patient, and agreed upon.   Patient Care Team: Nolene Ebbs as PCP - General (Family Medicine) Monica Becton, MD as Consulting Physician (Sports Medicine)  HISTORY OF PRESENT ILLNESS: 27 y.o. 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   09/30/2022 patient seen for rectal bleeding and diarrhea which was affecting her work.  Has  been filling out FMLA for the patient. 10/04/2022 CT abdomen pelvis with contrast was unremarkable 11/04/2022 colonoscopy and endoscopy at the hospital with Dr. Orvan Falconer. Showed no celiac, no inflammation of esophagus, negative H. pylori gastritis Patient did have a polyp removed from her colon which was benign, random biopsies negative for colitis/inflammation.  Patient continues to have elevated CRP and sed rate.   With elevated WBC.  Negative GI pathogen panel. Given trial of Xifaxan, by primary care. Unremarkable CT AB 09/2022 Colonoscopy and EGD without evidence of IBD Also asked to get referred to digestive health for second opinion.   She states last two years has had IBS in her opinion.  She has had 4 Bm's today, on average has 3-4 a day.  Rare nocturnal symptoms, can wake up at 2 AM with BM, last time a month ago.  She has a lot of urgency with the Bm's.  Had rectal bleeding once a week for last 3 months, in the toilet, TP, some rectal pain rare, BRB to dark red blood, moderate to large volume.  Imodium can help some.  No AB pain, can have AB bloating/tightness.  Has had nausea and vomiting x 2 years, zofran has not helped, reglan helped some. Nausea is worse in the morning with waking up, at least once a week.  She use to have GERD, since being on prilosec 40 mg daily for 1-2 years no GERD.  No dysphagia, no melena.  She is on reglan as needed, potentially every other day.  She works from home and it has interfered with her work.  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   Denies rashes/joint.   No NSAIDS,  ETOH once a week, no drug use.  Patient has been on zoloft 200mg  for 2-3 years, was on prozac prior, on trazodone 100mg ,  She can have sweating a lot, never feels flushing. Occ palpitations and anxiety. No family history of colon cancer, mother with colitis but uncertain what time.  No weight loss.   Wt Readings from Last 3 Encounters:  12/06/22 (!) 353 lb  (160.1 kg)  11/04/22 (!) 355 lb (161 kg)  10/22/22 (!) 360 lb (163.3 kg)     She  reports that she quit smoking about 5 years ago. Her smoking use included cigarettes. She has a 0.70 pack-year smoking history. She has never used smokeless tobacco. She reports current alcohol use of about 1.0 standard drink of alcohol per week. She reports that she does not use drugs.  RELEVANT LABS AND IMAGING: CBC    Component Value Date/Time   WBC 16.3 (H) 12/06/2022 1453   RBC 5.35 (H) 12/06/2022 1453   HGB 13.4 12/06/2022 1453   HGB 13.2 05/23/2018 1500   HCT 41.6 12/06/2022 1453   HCT 40.6 05/23/2018 1500   PLT 254 12/06/2022 1453   PLT 258 05/23/2018 1500   MCV 77.8 (L) 12/06/2022 1453   MCV 84 05/23/2018 1500   MCH 25.0 (L) 12/06/2022 1453   MCHC 32.2 12/06/2022 1453   RDW 14.4 12/06/2022 1453   RDW 13.9 05/23/2018 1500   LYMPHSABS 3,553 12/06/2022 1453   LYMPHSABS 3.3 (H) 05/23/2018 1500   MONOABS 0.7 09/30/2022 1544   EOSABS 98 12/06/2022 1453   EOSABS 0.1 05/23/2018 1500   BASOSABS 65 12/06/2022 1453   BASOSABS 0.0 05/23/2018 1500   Recent Labs    09/30/22 1544 10/04/22 1346 12/06/22 1453  HGB 13.2 13.3 13.4     CMP     Component Value Date/Time   NA 139 12/06/2022 1453   NA 141 05/23/2018 1500   K 4.2 12/06/2022 1453   CL 105 12/06/2022 1453   CO2 26 12/06/2022 1453   GLUCOSE 82 12/06/2022 1453   BUN 7 12/06/2022 1453   BUN 9 05/23/2018 1500   CREATININE 0.67 12/06/2022 1453   CALCIUM 9.6 12/06/2022 1453   PROT 7.0 12/06/2022 1453   PROT 6.4 05/23/2018 1500   ALBUMIN 3.9 10/04/2022 1346   ALBUMIN 4.3 05/23/2018 1500   AST 10 12/06/2022 1453   ALT 10 12/06/2022 1453   ALKPHOS 71 10/04/2022 1346   BILITOT 0.4 12/06/2022 1453   BILITOT <0.2 05/23/2018 1500   GFRNONAA >60 10/04/2022 1346   GFRNONAA 119 09/06/2019 0956   GFRAA 138 09/06/2019 0956      Latest Ref Rng & Units 12/06/2022    2:53 PM 10/04/2022    1:46 PM 09/30/2022    3:44 PM  Hepatic Function   Total Protein 6.1 - 8.1 g/dL 7.0  7.6  7.6   Albumin 3.5 - 5.0 g/dL  3.9  4.3   AST 10 - 30 U/L 10  16  15    ALT 6 - 29 U/L 10  17  20    Alk Phosphatase 38 - 126 U/L  71  78   Total Bilirubin 0.2 - 1.2 mg/dL 0.4  0.6  0.2       Current Medications:     Current Outpatient Medications (Respiratory):    albuterol (VENTOLIN HFA) 108 (90 Base) MCG/ACT inhaler, Inhale 2 puffs into the lungs every 6 (six) hours as needed for wheezing or shortness of breath.    Current Outpatient Medications (  Other):    [START ON 02/05/2023] methylphenidate (METADATE ER) 20 MG ER tablet, Take 1 tablet (20 mg total) by mouth daily.   methylphenidate (METADATE ER) 20 MG ER tablet, Take 1 tablet (20 mg total) by mouth daily.   methylphenidate (METADATE ER) 20 MG ER tablet, Take 1 tablet (20 mg total) by mouth daily.   metoCLOPramide (REGLAN) 10 MG tablet, Take 1 tablet (10 mg total) by mouth 3 (three) times daily as needed for nausea   omeprazole (PRILOSEC) 40 MG capsule, Take 1 capsule (40 mg total) by mouth 2 (two) times daily.   rifaximin (XIFAXAN) 550 MG TABS tablet, Take 1 tablet (550 mg total) by mouth 3 (three) times daily.   sertraline (ZOLOFT) 100 MG tablet, Take 2 tablets (200 mg total) by mouth in the morning.   traZODone (DESYREL) 50 MG tablet, Take 2 tablets (100 mg total) by mouth at bedtime.  Medical History:  Past Medical History:  Diagnosis Date   ADHD    Anxiety    Benign paroxysmal positional vertigo    Bulging lumbar disc 08/16/2012   Dry eye syndrome    HA (headache)    History of anal fissures    Low back pain    Neck pain    Pneumonia    Pseudopapilledema    Shoulder pain    Vision abnormalities    wears contacts, right eye worse   Allergies:  Allergies  Allergen Reactions   Peanut-Containing Drug Products Swelling and Other (See Comments)    Childhood reaction- lips became swollen/breathing not affected, though     Surgical History:  She  has a past surgical history  that includes Suboccipital craniectomy cervical laminectomy (N/A, 05/01/2013); Colonoscopy with propofol (N/A, 11/04/2022); Esophagogastroduodenoscopy (egd) with propofol (N/A, 11/04/2022); and biopsy (11/04/2022). Family History:  Her family history includes ADD / ADHD in her maternal aunt; Anxiety disorder in her maternal aunt, maternal grandfather, mother, and sister; Arthritis in her father; Dementia in her maternal grandfather; Depression in her maternal aunt, maternal grandfather, mother, and sister; High blood pressure in her father; Physical abuse in her sister; Sexual abuse in her sister.  REVIEW OF SYSTEMS  : All other systems reviewed and negative except where noted in the History of Present Illness.  PHYSICAL EXAM: There were no vitals taken for this visit. General Appearance: Well nourished, in no apparent distress. Head:   Normocephalic and atraumatic. Eyes:  sclerae anicteric,conjunctive pink  Respiratory: Respiratory effort normal, BS equal bilaterally without rales, rhonchi, wheezing. Cardio: RRR with no MRGs. Peripheral pulses intact.  Abdomen: Soft,  Obese ,active bowel sounds. mild tenderness in the lower abdomen. Without guarding and Without rebound. No masses. Rectal: declines Musculoskeletal: Full ROM, Normal gait. Without edema. Skin:  Dry and intact without significant lesions or rashes Neuro: Alert and  oriented x4;  No focal deficits. Psych:  Cooperative. Normal mood and affect.    Doree Albee, PA-C 1:14 PM

## 2023-01-20 ENCOUNTER — Ambulatory Visit: Payer: BC Managed Care – PPO | Admitting: Physician Assistant

## 2023-01-24 ENCOUNTER — Other Ambulatory Visit (HOSPITAL_BASED_OUTPATIENT_CLINIC_OR_DEPARTMENT_OTHER): Payer: Self-pay

## 2023-01-28 ENCOUNTER — Encounter: Payer: Self-pay | Admitting: Physician Assistant

## 2023-01-28 DIAGNOSIS — D72829 Elevated white blood cell count, unspecified: Secondary | ICD-10-CM

## 2023-01-28 DIAGNOSIS — R7982 Elevated C-reactive protein (CRP): Secondary | ICD-10-CM | POA: Diagnosis not present

## 2023-01-28 DIAGNOSIS — R718 Other abnormality of red blood cells: Secondary | ICD-10-CM

## 2023-01-28 DIAGNOSIS — K529 Noninfective gastroenteritis and colitis, unspecified: Secondary | ICD-10-CM | POA: Diagnosis not present

## 2023-01-28 DIAGNOSIS — R7989 Other specified abnormal findings of blood chemistry: Secondary | ICD-10-CM

## 2023-01-28 DIAGNOSIS — R634 Abnormal weight loss: Secondary | ICD-10-CM | POA: Diagnosis not present

## 2023-02-01 DIAGNOSIS — R7989 Other specified abnormal findings of blood chemistry: Secondary | ICD-10-CM | POA: Insufficient documentation

## 2023-02-01 DIAGNOSIS — D72829 Elevated white blood cell count, unspecified: Secondary | ICD-10-CM | POA: Insufficient documentation

## 2023-02-01 DIAGNOSIS — R718 Other abnormality of red blood cells: Secondary | ICD-10-CM | POA: Insufficient documentation

## 2023-02-09 ENCOUNTER — Inpatient Hospital Stay (HOSPITAL_BASED_OUTPATIENT_CLINIC_OR_DEPARTMENT_OTHER): Payer: BC Managed Care – PPO | Admitting: Hematology & Oncology

## 2023-02-09 ENCOUNTER — Inpatient Hospital Stay: Payer: BC Managed Care – PPO | Attending: Hematology & Oncology | Admitting: *Deleted

## 2023-02-09 ENCOUNTER — Encounter: Payer: Self-pay | Admitting: Hematology & Oncology

## 2023-02-09 VITALS — BP 136/69 | HR 100 | Temp 99.7°F | Resp 20 | Ht 67.0 in | Wt 349.1 lb

## 2023-02-09 DIAGNOSIS — D72829 Elevated white blood cell count, unspecified: Secondary | ICD-10-CM | POA: Diagnosis not present

## 2023-02-09 DIAGNOSIS — Z87891 Personal history of nicotine dependence: Secondary | ICD-10-CM | POA: Insufficient documentation

## 2023-02-09 DIAGNOSIS — D72823 Leukemoid reaction: Secondary | ICD-10-CM

## 2023-02-09 DIAGNOSIS — R7982 Elevated C-reactive protein (CRP): Secondary | ICD-10-CM | POA: Diagnosis not present

## 2023-02-09 DIAGNOSIS — Z79899 Other long term (current) drug therapy: Secondary | ICD-10-CM | POA: Diagnosis not present

## 2023-02-09 DIAGNOSIS — R1319 Other dysphagia: Secondary | ICD-10-CM | POA: Insufficient documentation

## 2023-02-09 LAB — CBC WITH DIFFERENTIAL (CANCER CENTER ONLY)
Abs Immature Granulocytes: 0.08 10*3/uL — ABNORMAL HIGH (ref 0.00–0.07)
Basophils Absolute: 0.1 10*3/uL (ref 0.0–0.1)
Basophils Relative: 0 %
Eosinophils Absolute: 0.1 10*3/uL (ref 0.0–0.5)
Eosinophils Relative: 1 %
HCT: 40.9 % (ref 36.0–46.0)
Hemoglobin: 13.1 g/dL (ref 12.0–15.0)
Immature Granulocytes: 1 %
Lymphocytes Relative: 17 %
Lymphs Abs: 2.6 10*3/uL (ref 0.7–4.0)
MCH: 25.6 pg — ABNORMAL LOW (ref 26.0–34.0)
MCHC: 32 g/dL (ref 30.0–36.0)
MCV: 80 fL (ref 80.0–100.0)
Monocytes Absolute: 0.7 10*3/uL (ref 0.1–1.0)
Monocytes Relative: 5 %
Neutro Abs: 11.9 10*3/uL — ABNORMAL HIGH (ref 1.7–7.7)
Neutrophils Relative %: 76 %
Platelet Count: 245 10*3/uL (ref 150–400)
RBC: 5.11 MIL/uL (ref 3.87–5.11)
RDW: 14 % (ref 11.5–15.5)
WBC Count: 15.4 10*3/uL — ABNORMAL HIGH (ref 4.0–10.5)
nRBC: 0 % (ref 0.0–0.2)

## 2023-02-09 LAB — CMP (CANCER CENTER ONLY)
ALT: 11 U/L (ref 0–44)
AST: 10 U/L — ABNORMAL LOW (ref 15–41)
Albumin: 4.3 g/dL (ref 3.5–5.0)
Alkaline Phosphatase: 74 U/L (ref 38–126)
Anion gap: 8 (ref 5–15)
BUN: 8 mg/dL (ref 6–20)
CO2: 27 mmol/L (ref 22–32)
Calcium: 9.8 mg/dL (ref 8.9–10.3)
Chloride: 105 mmol/L (ref 98–111)
Creatinine: 0.71 mg/dL (ref 0.44–1.00)
GFR, Estimated: >60 mL/min (ref >60)
Glucose, Bld: 111 mg/dL — ABNORMAL HIGH (ref 70–99)
Potassium: 4 mmol/L (ref 3.5–5.1)
Sodium: 140 mmol/L (ref 135–145)
Total Bilirubin: 0.4 mg/dL (ref 0.3–1.2)
Total Protein: 6.9 g/dL (ref 6.5–8.1)

## 2023-02-09 LAB — LACTATE DEHYDROGENASE: LDH: 110 U/L (ref 98–192)

## 2023-02-09 LAB — RETICULOCYTES
Immature Retic Fract: 15.6 % (ref 2.3–15.9)
RBC.: 5.11 MIL/uL (ref 3.87–5.11)
Retic Count, Absolute: 64.9 10*3/uL (ref 19.0–186.0)
Retic Ct Pct: 1.3 % (ref 0.4–3.1)

## 2023-02-09 LAB — SAVE SMEAR(SSMR), FOR PROVIDER SLIDE REVIEW

## 2023-02-09 LAB — VITAMIN B12: Vitamin B-12: 295 pg/mL (ref 180–914)

## 2023-02-09 LAB — FERRITIN: Ferritin: 33 ng/mL (ref 11–307)

## 2023-02-09 NOTE — Progress Notes (Signed)
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  : White blood cells are elevated and high CRP  HPI: [name] is a very nice 27 year old white female.  She comes in with her sister.  They actually live close by to the office.    She was kindly referred by Tandy Gaw, NP because of high white cell count.  Ms. Lockner has had her share of health issues.  She has had multiple health issues.  She actually had a Chiari malformation that was treated surgically I think at age 37.  She has had upper endoscopy and lower endoscopy.  I think this was all recently.  The pathology was negative.  She is having some difficulty swallowing.  I am not too sure as to why she would have this.  She had a CT of the abdomen pelvis back in February of this year.  Again, this really did not show anything that was unusual in the abdomen or pelvis.  She has been found to have a chronically elevated white blood cell count.  Going back to February of this year, her white cell count was 15.6.  Hemoglobin 13.2 platelet count was 267,000.  In April, white blood cell count was 16.3.  Hemoglobin 13.4.  Platelet count 254,000.  Surprising, MCV was 78.  Today, her white cell count is 15.4 with a hemoglobin of 13.1 and platelet count 245,000.  The MCV is 80.  She had a normal white blood cell differential.  She also has had elevated CRP.  I really not too sure what this indicates.  I would think that this is some measure of inflammation that she is dealing with within her body.  She does not smoke.  She has rare beer.  She has had no obvious change in bowel or bladder habits.  She has her monthly cycles.  She has had some chronic arthralgias and myalgias.  Her electrolytes have all been pretty much unremarkable.  She has had normal LDH.  Again, the C-reactive protein back in February was  23.6.  This has not been repeated since.  She has had no rashes.  There is no visual changes.  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  Her level was 385 mg/dL in April.  Currently, I would say performance status is probably ECOG 1.    Past Medical History:  Diagnosis Date   ADHD    Anxiety    Benign paroxysmal positional vertigo    Bulging lumbar disc 08/16/2012   Dry eye syndrome    HA (headache)    History of anal fissures    Low back pain    Neck pain    Pneumonia    Pseudopapilledema    Shoulder pain    Vision abnormalities    wears contacts, right eye worse  :   Past Surgical History:  Procedure Laterality Date   BIOPSY  11/04/2022   Procedure: BIOPSY;  Surgeon: Tressia Danas, MD;  Location: [facility];  Service: Gastroenterology;;   COLONOSCOPY WITH PROPOFOL N/A 11/04/2022   Procedure: COLONOSCOPY WITH PROPOFOL;  Surgeon: Tressia Danas, MD;  Location: [facility];  Service: Gastroenterology;  Laterality: N/A;  w biopsies   ESOPHAGOGASTRODUODENOSCOPY (EGD) WITH PROPOFOL N/A 11/04/2022   Procedure: ESOPHAGOGASTRODUODENOSCOPY (EGD) WITH PROPOFOL;  Surgeon: Tressia Danas, MD;  Location: [facility];  Service: Gastroenterology;  Laterality: N/A;  w biopsies   SUBOCCIPITAL CRANIECTOMY CERVICAL LAMINECTOMY N/A 05/01/2013   Procedure: Chiari Decompression;  Surgeon: Maeola Harman, MD;  Location: [facility];  Service: Neurosurgery;  Laterality: N/A;  Chiari Decompression  :   Current Outpatient Medications:    albuterol (VENTOLIN HFA) 108 (90 Base) MCG/ACT inhaler, Inhale 2 puffs into the lungs every 6 (six) hours as needed for wheezing or shortness of breath., Disp: 18 g, Rfl: 0   methylphenidate (METADATE ER) 20 MG ER tablet, Take 1 tablet (20 mg total) by mouth daily., Disp: 30 tablet, Rfl: 0   metoCLOPramide (REGLAN) 10 MG tablet, Take 1 tablet (10 mg total) by mouth 3 (three) times daily as needed for nausea, Disp: 30 tablet, Rfl: 0    omeprazole (PRILOSEC) 40 MG capsule, Take 1 capsule (40 mg total) by mouth 2 (two) times daily., Disp: 90 capsule, Rfl: 3   sertraline (ZOLOFT) 100 MG tablet, Take 2 tablets (200 mg total) by mouth in the morning., Disp: 180 tablet, Rfl: 1   traZODone (DESYREL) 50 MG tablet, Take 2 tablets (100 mg total) by mouth at bedtime., Disp: 90 tablet, Rfl: 3   cyclobenzaprine (FLEXERIL) 10 MG tablet, Take by mouth 3 (three) times daily as needed. (Patient not taking: Reported on 02/09/2023), Disp: , Rfl:    fluticasone (FLONASE) 50 MCG/ACT nasal spray, Place 1 spray into both nostrils daily. (Patient not taking: Reported on 02/09/2023), Disp: , Rfl: :  :   Allergies  Allergen Reactions   Peanut-Containing Drug Products Swelling and Other (See Comments)    Childhood reaction- lips became swollen/breathing not affected, though  :   Family History  Problem Relation Age of Onset   Anxiety disorder Mother    Depression Mother    High blood pressure Father    Arthritis Father    Anxiety disorder Sister    Depression Sister    Physical abuse Sister    Sexual abuse Sister    Anxiety disorder Maternal Grandfather    Dementia Maternal Grandfather    Depression Maternal Grandfather    ADD / ADHD Maternal Aunt    Anxiety disorder Maternal Aunt    Depression Maternal Aunt    Colon cancer Neg Hx    Stomach cancer Neg Hx    Esophageal cancer Neg Hx   :   Social History   Socioeconomic History   Marital status: Single    Spouse name: Not on file   Number of children: 0   Years of education: Not on file   Highest education level: Some college, no degree  Occupational History    Comment: Lawyer   Occupation: Lobbyist  Tobacco Use   Smoking status: Former    Packs/day: 0.10    Years: 7.00    Additional pack years: 0.00    Total pack years: 0.70    Types: Cigarettes    Quit date: 03/18/2017    Years since quitting: 5.9   Smokeless tobacco: Never   Tobacco comments:    Smokes  about 2 per day.  Vaping Use   Vaping Use: Former   Substances: Nicotine  Substance and Sexual Activity   Alcohol use: Yes    Alcohol/week: 2.0 standard drinks of alcohol    Types: 1 Glasses of wine, 1 Cans of beer per week    Comment: once a week   Drug use: No   Sexual activity: Yes    Partners: Male  Other Topics Concern   Not on file  Social History Narrative   Patient lives at home with parents . 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    Right handed.   Caffeine- One cup of coffee daily and one soda.    Social Determinants of Health   Financial Resource Strain: Low Risk  (12/05/2022)   Overall Financial Resource Strain (CARDIA)    Difficulty of Paying Living Expenses: Not hard at all  Food Insecurity: No Food Insecurity (02/09/2023)   Hunger Vital Sign    Worried About Running Out of Food in the Last Year: Never true    Ran Out of Food in the Last Year: Never true  Transportation Needs: No Transportation Needs (12/05/2022)   PRAPARE - Administrator, Civil Service (Medical): No    Lack of Transportation (Non-Medical): No  Physical Activity: Insufficiently Active (12/05/2022)   Exercise Vital Sign    Days of Exercise per Week: 1 day    Minutes of Exercise per Session: 10 min  Stress: No Stress Concern Present (12/05/2022)   [employer] of Occupational Health - Occupational Stress Questionnaire    Feeling of Stress : Only a little  Social Connections: Moderately Isolated (12/05/2022)   Social Connection and Isolation Panel [NHANES]    Frequency of Communication with Friends and Family: Twice a week    Frequency of Social Gatherings with Friends and Family: Once a week    Attends Religious Services: Never    Database administrator or Organizations: No    Attends Engineer, structural: Not on file    Marital Status: Living with partner  Intimate Partner Violence: Not At Risk (02/09/2023)   Humiliation, Afraid, Rape, and  Kick questionnaire    Fear of Current or Ex-Partner: No    Emotionally Abused: No    Physically Abused: No    Sexually Abused: No  :  Review of Systems  Constitutional:  Positive for malaise/fatigue.  HENT:  Positive for sore throat.   Eyes: Negative.   Respiratory: Negative.    Cardiovascular: Negative.   Gastrointestinal:  Positive for abdominal pain and nausea.  Genitourinary: Negative.   Musculoskeletal:  Positive for joint pain and myalgias.  Skin: Negative.   Neurological: Negative.   Endo/Heme/Allergies: Negative.   Psychiatric/Behavioral: Negative.       Exam: Temperature is 99.7.  Pulse 100.  Blood pressure 136/69.  Weight is 349 pounds.  @IPVITALS @ Physical Exam Vitals reviewed.  HENT:     Head: Normocephalic and atraumatic.  Eyes:     Pupils: Pupils are equal, round, and reactive to light.  Cardiovascular:     Rate and Rhythm: Normal rate and regular rhythm.     Heart sounds: Normal heart sounds.  Pulmonary:     Effort: Pulmonary effort is normal.     Breath sounds: Normal breath sounds.  Abdominal:     General: Bowel sounds are normal.     Palpations: Abdomen is soft.  Musculoskeletal:        General: No tenderness or deformity. Normal range of motion.     Cervical back: Normal range of motion.  Lymphadenopathy:     Cervical: No cervical adenopathy.  Skin:    General: Skin is warm and dry.     Findings: No erythema or rash.  Neurological:     Mental Status: She is alert and oriented to person, place, and time.  Psychiatric:        Behavior: Behavior normal.  Thought Content: Thought content normal.        Judgment: Judgment normal.     Recent Labs    02/09/23 1337  WBC 15.4*  HGB 13.1  HCT 40.9  PLT 245    Recent Labs    02/09/23 1337  NA 140  K 4.0  CL 105  CO2 27  GLUCOSE 111*  BUN 8  CREATININE 0.71  CALCIUM 9.8    Blood smear review: Mild anisocytosis and poikilocytosis.  I do not see any rouleaux formation.  There  is no nucleated red blood cells.  I see no schistocytes or spherocytes.  White blood cells are relatively normal morphology maturation.  She does have numerous hypersegmented polys.  I do not see any immature myeloid cells.  Platelets are adequate in number and size.  Pathology: None    Assessment and Plan: Ms. Maves is a very nice 27 year old white female.  I am not sure exactly what is going on.  However, I think that a bone marrow test is indicated here.  I think a bone marrow test could certainly tell us if there is any bone marrow pathology.  I would not think that she would have a myeloproliferative neoplasm but this is always a possibility.  I am unsure why her MCV is on the low side.  I would think that she might be iron deficient.  This could be because of her monthly cycles.  I am checking a vitamin B12 level on her.  We will see what her iron studies show.  Again, I am not sure what to make of the elevated C-reactive protein.  This is not a indicator of malignancy in her case from what I can tell.  I do not think she needs any additional scans.  She does not smoke so I do not think a CT scan of the chest would help [place] out.  Again, I think that this is the case we will be manage to think about a bone marrow biopsy on her.  I think this would really tell us if there is any issue with her bone marrow that could be causing some of the abnormal cell counts.  I had a long talk with she and her sister.  They are both very nice.  They actually are quite funny.  She has a very good attitude.  I know that she has been through quite a lot.  I am not sure what to make about the swallowing.  I know she has seen gastroenterology.  She is not too interested in seeing the previous gastroenterologist.  We will see about getting a bone marrow biopsy set up for her in a couple weeks.  I will then plan to get her back to see [place] in probably about 3 or 4 weeks at which point I should have back enough  information to know how to proceed.

## 2023-02-10 ENCOUNTER — Encounter: Payer: Self-pay | Admitting: *Deleted

## 2023-02-10 LAB — IRON AND IRON BINDING CAPACITY (CC-WL,HP ONLY)
Iron: 30 ug/dL (ref 28–170)
Saturation Ratios: 8 % — ABNORMAL LOW (ref 10.4–31.8)
TIBC: 367 ug/dL (ref 250–450)
UIBC: 337 ug/dL (ref 148–442)

## 2023-02-10 LAB — IGG, IGA, IGM
IgA: 360 mg/dL — ABNORMAL HIGH (ref 87–352)
IgG (Immunoglobin G), Serum: 718 mg/dL (ref 586–1602)
IgM (Immunoglobulin M), Srm: 250 mg/dL — ABNORMAL HIGH (ref 26–217)

## 2023-02-14 ENCOUNTER — Encounter: Payer: Self-pay | Admitting: *Deleted

## 2023-02-14 NOTE — Progress Notes (Signed)
Bone marrow is scheduled for July 12,

## 2023-02-16 ENCOUNTER — Other Ambulatory Visit (HOSPITAL_BASED_OUTPATIENT_CLINIC_OR_DEPARTMENT_OTHER): Payer: Self-pay

## 2023-02-21 ENCOUNTER — Other Ambulatory Visit: Payer: Self-pay

## 2023-02-21 ENCOUNTER — Encounter: Payer: Self-pay | Admitting: Hematology & Oncology

## 2023-02-21 ENCOUNTER — Other Ambulatory Visit (HOSPITAL_BASED_OUTPATIENT_CLINIC_OR_DEPARTMENT_OTHER): Payer: Self-pay

## 2023-02-22 DIAGNOSIS — D509 Iron deficiency anemia, unspecified: Secondary | ICD-10-CM | POA: Insufficient documentation

## 2023-02-24 ENCOUNTER — Other Ambulatory Visit (HOSPITAL_COMMUNITY): Payer: Self-pay | Admitting: Student

## 2023-02-24 DIAGNOSIS — D72828 Other elevated white blood cell count: Secondary | ICD-10-CM

## 2023-02-24 NOTE — H&P (Signed)
Chief Complaint: Patient was seen in consultation today for leukocytosis/leukemoid reaction.  Referring Physician(s): Ennever,Peter R  Supervising Physician: Roanna Banning  Patient Status: [facility] - Out-pt  History of Present Illness: Emma Boyer is a 27 y.o. female with a past medical history significant ADHD, anxiety, BPPV, pseudopapilledema, Chiari malformation s/p decompression who presents today for bone marrow aspiration/biopsy. [name] has been experiencing diarrhea, lower abdominal pain, cramping, bloody and mucousy stools since the beginning of this year. She was seen by her PCP and lab work showed elevated ESR, CRP, WBC, IgA and negative GI pathogen panel. CT abd/pelvis w/contrast 10/04/22 was negative. She was seen by GI and underwent EGD/colonoscopy in March which was essentially negative. She was referred to heme/onc for evaluation of leukocytosis and high CRP and was seen by Dr. Myna Hidalgo 02/09/23 who reviewed peripheral blood smear and noted:  Blood smear review: Mild anisocytosis and poikilocytosis.  I do not see any rouleaux formation.  There is no nucleated red blood cells.  I see no schistocytes or spherocytes.  White blood cells are relatively normal morphology maturation.  She does have numerous hypersegmented polys.  I do not see any immature myeloid cells.  Platelets are adequate in number and size.   She was also noted to have very low iron.   She has been referred to IR for bone marrow aspiration/biopsy for further evaluation.  Past Medical History:  Diagnosis Date   ADHD    Anxiety    Benign paroxysmal positional vertigo    Bulging lumbar disc 08/16/2012   Dry eye syndrome    HA (headache)    History of anal fissures    Low back pain    Neck pain    Pneumonia    Pseudopapilledema    Shoulder pain    Vision abnormalities    wears contacts, right eye worse    Past Surgical History:  Procedure Laterality Date   BIOPSY  11/04/2022   Procedure: BIOPSY;   Surgeon: Tressia Danas, MD;  Location: [facility];  Service: Gastroenterology;;   COLONOSCOPY WITH PROPOFOL N/A 11/04/2022   Procedure: COLONOSCOPY WITH PROPOFOL;  Surgeon: Tressia Danas, MD;  Location: [facility];  Service: Gastroenterology;  Laterality: N/A;  w biopsies   ESOPHAGOGASTRODUODENOSCOPY (EGD) WITH PROPOFOL N/A 11/04/2022   Procedure: ESOPHAGOGASTRODUODENOSCOPY (EGD) WITH PROPOFOL;  Surgeon: Tressia Danas, MD;  Location: [facility];  Service: Gastroenterology;  Laterality: N/A;  w biopsies   SUBOCCIPITAL CRANIECTOMY CERVICAL LAMINECTOMY N/A 05/01/2013   Procedure: Chiari Decompression;  Surgeon: Maeola Harman, MD;  Location: [facility];  Service: Neurosurgery;  Laterality: N/A;  Chiari Decompression    Allergies: Peanut-containing drug products  Medications: Prior to Admission medications   Medication Sig Start Date End Date Taking? Authorizing Provider  albuterol (VENTOLIN HFA) 108 (90 Base) MCG/ACT inhaler Inhale 2 puffs into the lungs every 6 (six) hours as needed for wheezing or shortness of breath. 08/20/22   Breeback, Lesly Rubenstein L, PA-C  cyclobenzaprine (FLEXERIL) 10 MG tablet Take by mouth 3 (three) times daily as needed. Patient not taking: Reported on 02/09/2023 10/29/22   [provider]  fluticasone (FLONASE) 50 MCG/ACT nasal spray Place 1 spray into both nostrils daily. Patient not taking: Reported on 02/09/2023    [provider]  methylphenidate (METADATE ER) 20 MG ER tablet Take 1 tablet (20 mg total) by mouth daily. 02/05/23   Breeback, Lonna Cobb, PA-C  metoCLOPramide (REGLAN) 10 MG tablet Take 1 tablet (10 mg total) by mouth 3 (three) times  daily as needed for nausea 01/17/23   Breeback, Jade L, PA-C  omeprazole (PRILOSEC) 40 MG capsule Take 1 capsule (40 mg total) by mouth 2 (two) times daily. 10/05/22   Doree Albee, PA-C  sertraline (ZOLOFT) 100 MG tablet Take 2 tablets (200 mg total) by mouth in the morning. 12/06/22   Breeback, Jade L,  PA-C  traZODone (DESYREL) 50 MG tablet Take 2 tablets (100 mg total) by mouth at bedtime. 12/06/22   Jomarie Longs, PA-C     Family History  Problem Relation Age of Onset   Anxiety disorder Mother    Depression Mother    High blood pressure Father    Arthritis Father    Anxiety disorder Sister    Depression Sister    Physical abuse Sister    Sexual abuse Sister    Anxiety disorder Maternal Grandfather    Dementia Maternal Grandfather    Depression Maternal Grandfather    ADD / ADHD Maternal Aunt    Anxiety disorder Maternal Aunt    Depression Maternal Aunt    Colon cancer Neg Hx    Stomach cancer Neg Hx    Esophageal cancer Neg Hx     Social History   Socioeconomic History   Marital status: Single    Spouse name: Not on file   Number of children: 0   Years of education: Not on file   Highest education level: Some college, no degree  Occupational History    Comment: Lawyer   Occupation: Lobbyist  Tobacco Use   Smoking status: Former    Current packs/day: 0.00    Average packs/day: 0.1 packs/day for 7.0 years (0.7 ttl pk-yrs)    Types: Cigarettes    Start date: 03/18/2010    Quit date: 03/18/2017    Years since quitting: 5.9   Smokeless tobacco: Never   Tobacco comments:    Smokes about 2 per day.  Vaping Use   Vaping status: Former   Substances: Nicotine  Substance and Sexual Activity   Alcohol use: Yes    Alcohol/week: 2.0 standard drinks of alcohol    Types: 1 Glasses of wine, 1 Cans of beer per week    Comment: once a week   Drug use: No   Sexual activity: Yes    Partners: Male  Other Topics Concern   Not on file  Social History Narrative   Patient lives at home with parents . 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    Right handed.   Caffeine- One cup of coffee daily and one soda.    Social Determinants of Health   Financial Resource Strain: Low Risk  (12/20/2022)   Received from [facility]   Overall  Financial Resource Strain (CARDIA)    Difficulty of Paying Living Expenses: Not hard at all  Food Insecurity: No Food Insecurity (02/09/2023)   Hunger Vital Sign    Worried About Running Out of Food in the Last Year: Never true    Ran Out of Food in the Last Year: Never true  Transportation Needs: No Transportation Needs (12/20/2022)   Received from [facility] - Transportation    Lack of Transportation (Medical): No    Lack of Transportation (Non-Medical): No  Physical Activity: Insufficiently Active (12/20/2022)   Received from [facility]   Exercise Vital Sign    Days of Exercise per Week: 1 day    Minutes of Exercise per Session: 30  min  Stress: Stress Concern Present (12/20/2022)   Received from [facility] - Occupational Stress Questionnaire    Feeling of Stress : To some extent  Social Connections: Socially Integrated (12/20/2022)   Received from [facility]   Social Network    How would you rate your social network (family, work, friends)?: Good participation with social networks  Recent Concern: Social Connections - Moderately Isolated (12/05/2022)   Social Connection and Isolation Panel [NHANES]    Frequency of Communication with Friends and Family: Twice a week    Frequency of Social Gatherings with Friends and Family: Once a week    Attends Religious Services: Never    Database administrator or Organizations: No    Attends Engineer, structural: Not on file    Marital Status: Living with partner     Review of Systems: A 12 point ROS discussed and pertinent positives are indicated in the HPI above.  All other systems are negative.  Review of Systems  Constitutional:  Negative for chills and fever.  Respiratory:  Negative for cough and shortness of breath.   Cardiovascular:  Negative for chest pain.  Gastrointestinal:  Negative for abdominal pain, diarrhea, nausea and vomiting.  Musculoskeletal:  Negative  for back pain.  Neurological:  Negative for dizziness and headaches.    Vital Signs: There were no vitals taken for this visit.  Physical Exam Vitals reviewed.  Constitutional:      General: She is not in acute distress. HENT:     Head: Normocephalic.     Mouth/Throat:     Mouth: Mucous membranes are moist.     Pharynx: Oropharynx is clear. No oropharyngeal exudate or posterior oropharyngeal erythema.  Cardiovascular:     Rate and Rhythm: Normal rate and regular rhythm.  Pulmonary:     Effort: Pulmonary effort is normal.     Breath sounds: Normal breath sounds.  Abdominal:     General: There is no distension.     Palpations: Abdomen is soft.     Tenderness: There is no abdominal tenderness.  Skin:    General: Skin is warm and dry.  Neurological:     Mental Status: She is alert and oriented to person, place, and time.  Psychiatric:        Mood and Affect: Mood normal.        Behavior: Behavior normal.        Thought Content: Thought content normal.        Judgment: Judgment normal.      MD Evaluation Airway: WNL Heart: WNL Abdomen: WNL Chest/ Lungs: WNL ASA  Classification: 2 Mallampati/Airway Score: One   Imaging: No results found.  Labs:  CBC: Recent Labs    09/30/22 1544 10/04/22 1346 12/06/22 1453 02/09/23 1337  WBC 15.6* 14.7* 16.3* 15.4*  HGB 13.2 13.3 13.4 13.1  HCT 40.5 41.4 41.6 40.9  PLT 267.0 284 254 245    COAGS: No results for input(s): "INR", "APTT" in the last 8760 hours.  BMP: Recent Labs    09/30/22 1544 10/04/22 1346 12/06/22 1453 02/09/23 1337  NA 138 134* 139 140  K 3.9 3.8 4.2 4.0  CL 102 105 105 105  CO2 24 21* 26 27  GLUCOSE 84 91 82 111*  BUN 9 7 7 8   CALCIUM 9.7 8.6* 9.6 9.8  CREATININE 0.76 0.74 0.67 0.71  GFRNONAA  --  >60  --  >60    LIVER  FUNCTION TESTS: Recent Labs    09/30/22 1544 10/04/22 1346 12/06/22 1453 02/09/23 1337  BILITOT 0.2 0.6 0.4 0.4  AST 15 16 10  10*  ALT 20 17 10 11   ALKPHOS 78  71  --  74  PROT 7.6 7.6 7.0 6.9  ALBUMIN 4.3 3.9  --  4.3    TUMOR MARKERS: No results for input(s): "AFPTM", "CEA", "CA199", "CHROMGRNA" in the last 8760 hours.  Assessment and Plan:  27 y/o F with history of diarrhea, bloody and mucousy stools, abdominal pain, joint pain, fatigue, sore throat with negative workup for cause thus far. She was noted to have very low iron studies, leukocytosis and elevated CRP. She was referred to heme/onc for this reason and recommendation is to proceed with bone marrow aspiration/biopsy for further evaluation.  Risks and benefits of bone marrow aspiration/biopsy was discussed with the patient and/or patient's family including, but not limited to bleeding, infection, damage to adjacent structures or low yield requiring additional tests.  All of the questions were answered and there is agreement to proceed.  Consent signed and in chart.  Thank you for this interesting consult.  I greatly enjoyed meeting JASMYN PICHA and look forward to participating in their care.  A copy of this report was sent to the requesting provider on this date.  Electronically Signed: Villa Herb, PA-C 02/24/2023, 8:19 AM   I spent a total of  30 Minutes  in face to face in clinical consultation, greater than 50% of which was counseling/coordinating care for bone marrow biopsy.

## 2023-02-25 ENCOUNTER — Ambulatory Visit (HOSPITAL_COMMUNITY)
Admission: RE | Admit: 2023-02-25 | Discharge: 2023-02-25 | Disposition: A | Discharge status: Home / Self Care | Payer: BC Managed Care – PPO | Source: Ambulatory Visit | Attending: Hematology & Oncology | Admitting: Hematology & Oncology

## 2023-02-25 ENCOUNTER — Other Ambulatory Visit: Payer: Self-pay

## 2023-02-25 ENCOUNTER — Other Ambulatory Visit: Payer: Self-pay | Admitting: Hematology & Oncology

## 2023-02-25 ENCOUNTER — Encounter (HOSPITAL_COMMUNITY): Payer: Self-pay

## 2023-02-25 DIAGNOSIS — D72828 Other elevated white blood cell count: Secondary | ICD-10-CM

## 2023-02-25 DIAGNOSIS — D72829 Elevated white blood cell count, unspecified: Secondary | ICD-10-CM | POA: Diagnosis not present

## 2023-02-25 DIAGNOSIS — R1030 Lower abdominal pain, unspecified: Secondary | ICD-10-CM | POA: Insufficient documentation

## 2023-02-25 DIAGNOSIS — D72823 Leukemoid reaction: Secondary | ICD-10-CM | POA: Insufficient documentation

## 2023-02-25 DIAGNOSIS — F419 Anxiety disorder, unspecified: Secondary | ICD-10-CM | POA: Diagnosis not present

## 2023-02-25 DIAGNOSIS — D759 Disease of blood and blood-forming organs, unspecified: Secondary | ICD-10-CM | POA: Diagnosis not present

## 2023-02-25 DIAGNOSIS — D720 Genetic anomalies of leukocytes: Secondary | ICD-10-CM | POA: Diagnosis not present

## 2023-02-25 LAB — CBC WITH DIFFERENTIAL/PLATELET
Abs Immature Granulocytes: 0.06 10*3/uL (ref 0.00–0.07)
Basophils Absolute: 0.1 10*3/uL (ref 0.0–0.1)
Basophils Relative: 1 %
Eosinophils Absolute: 0.1 10*3/uL (ref 0.0–0.5)
Eosinophils Relative: 1 %
HCT: 40.1 % (ref 36.0–46.0)
Hemoglobin: 12.5 g/dL (ref 12.0–15.0)
Immature Granulocytes: 0 %
Lymphocytes Relative: 21 %
Lymphs Abs: 2.8 10*3/uL (ref 0.7–4.0)
MCH: 24.8 pg — ABNORMAL LOW (ref 26.0–34.0)
MCHC: 31.2 g/dL (ref 30.0–36.0)
MCV: 79.6 fL — ABNORMAL LOW (ref 80.0–100.0)
Monocytes Absolute: 0.8 10*3/uL (ref 0.1–1.0)
Monocytes Relative: 6 %
Neutro Abs: 9.8 10*3/uL — ABNORMAL HIGH (ref 1.7–7.7)
Neutrophils Relative %: 71 %
Platelets: 250 10*3/uL (ref 150–400)
RBC: 5.04 MIL/uL (ref 3.87–5.11)
RDW: 14.1 % (ref 11.5–15.5)
WBC: 13.6 10*3/uL — ABNORMAL HIGH (ref 4.0–10.5)
nRBC: 0 % (ref 0.0–0.2)

## 2023-02-25 MED ORDER — LIDOCAINE HCL (PF) 1 % IJ SOLN
INTRAMUSCULAR | Status: AC | PRN
  Administered 2023-02-25: 20 mL

## 2023-02-25 MED ORDER — MIDAZOLAM HCL 2 MG/2ML IJ SOLN
INTRAMUSCULAR | Status: AC | PRN
  Administered 2023-02-25 (×3): 1 mg via INTRAVENOUS

## 2023-02-25 MED ORDER — NALOXONE HCL 0.4 MG/ML IJ SOLN
INTRAMUSCULAR | Status: AC
  Filled 2023-02-25: qty 1

## 2023-02-25 MED ORDER — SODIUM CHLORIDE 0.9 % IV SOLN: INTRAVENOUS | Status: DC

## 2023-02-25 MED ORDER — FLUMAZENIL 0.5 MG/5ML IV SOLN
INTRAVENOUS | Status: AC
  Filled 2023-02-25: qty 5

## 2023-02-25 MED ORDER — FENTANYL CITRATE (PF) 100 MCG/2ML IJ SOLN
INTRAMUSCULAR | Status: AC
  Filled 2023-02-25: qty 4

## 2023-02-25 MED ORDER — FENTANYL CITRATE (PF) 100 MCG/2ML IJ SOLN
INTRAMUSCULAR | Status: AC
  Filled 2023-02-25: qty 2

## 2023-02-25 MED ORDER — FENTANYL CITRATE (PF) 100 MCG/2ML IJ SOLN
INTRAMUSCULAR | Status: AC | PRN
  Administered 2023-02-25 (×3): 50 ug via INTRAVENOUS

## 2023-02-25 MED ORDER — MIDAZOLAM HCL 2 MG/2ML IJ SOLN
INTRAMUSCULAR | Status: AC
  Filled 2023-02-25: qty 4

## 2023-02-25 NOTE — Procedures (Signed)
Vascular and Interventional Radiology Procedure Note  Patient: Emma Boyer DOB: 1996-08-07 Medical Record Number: [id number] Note Date/Time: 02/25/23 8:55 AM   Performing Physician: Roanna Banning, MD Assistant(s): None  Diagnosis: > WBCs   Procedure: BONE MARROW ASPIRATION and BIOPSY  Anesthesia: Conscious Sedation Complications: None Estimated Blood Loss: Minimal Specimens: Sent for Pathology  Findings:  Successful CT-guided bone marrow aspiration and biopsy A total of 1 cores were obtained. Hemostasis of the tract was achieved using Manual Pressure.  Plan: Bed rest for 1 hours.  See detailed procedure note with images in PACS. The patient tolerated the procedure well without incident or complication and was returned to Recovery in stable condition.    Roanna Banning, MD V[facility] Interventional Radiology [facility] Radiology   Pager. [phone] Clinic. [phone]

## 2023-02-25 NOTE — Discharge Instructions (Signed)
Discharge Instructions:   Please call Interventional Radiology clinic [phone] with any questions or concerns.  You may remove your dressing and shower tomorrow.   Moderate Conscious Sedation, Adult, Care After This sheet gives you information about how to care for yourself after your procedure. Your health care provider may also give you more specific instructions. If you have problems or questions, contact your health care provider. What can I expect after the procedure? After the procedure, it is common to have: Sleepiness for several hours. Impaired judgment for several hours. Difficulty with balance. Vomiting if you eat too soon. Follow these instructions at home: For the time period you were told by your health care provider: Rest. Do not participate in activities where you could fall or become injured. Do not drive or use machinery. Do not drink alcohol. Do not take sleeping pills or medicines that cause drowsiness. Do not make important decisions or sign legal documents. Do not take care of children on your own. Eating and drinking  Follow the diet recommended by your health care provider. Drink enough fluid to keep your urine pale yellow. If you vomit: Drink water, juice, or soup when you can drink without vomiting. Make sure you have little or no nausea before eating solid foods. General instructions Take over-the-counter and prescription medicines only as told by your health care provider. Have a responsible adult stay with you for the time you are told. It is important to have someone help care for you until you are awake and alert. Do not smoke. Keep all follow-up visits as told by your health care provider. This is important. Contact a health care provider if: You are still sleepy or having trouble with balance after 24 hours. You feel light-headed. You keep feeling nauseous or you keep vomiting. You develop a rash. You have a fever. You have redness or  swelling around the IV site. Get help right away if: You have trouble breathing. You have new-onset confusion at home. Summary After the procedure, it is common to feel sleepy, have impaired judgment, or feel nauseous if you eat too soon. Rest after you get home. Know the things you should not do after the procedure. Follow the diet recommended by your health care provider and drink enough fluid to keep your urine pale yellow. Get help right away if you have trouble breathing or new-onset confusion at home. This information is not intended to replace advice given to you by your health care provider. Make sure you discuss any questions you have with your health care provider. Document Revised: 11/30/2019 Document Reviewed: 06/28/2019 Elsevier Patient Education  2023 Elsevier Inc.   Bone Marrow Aspiration and Bone Marrow Biopsy, Adult, Care After This sheet gives you information about how to care for yourself after your procedure. Your health care provider may also give you more specific instructions. If you have problems or questions, contact your health care provider. What can I expect after the procedure? After the procedure, it is common to have: Mild pain and tenderness. Swelling. Bruising. Follow these instructions at home: Puncture site care  Follow instructions from your health care provider about how to take care of the puncture site. Make sure you: Wash your hands with soap and water before and after you change your bandage (dressing). If soap and water are not available, use hand sanitizer. Change your dressing as told by your health care provider. Check your puncture site every day for signs of infection. Check for: More redness, swelling, or pain.   Fluid or blood. Warmth. Pus or a bad smell. Activity Return to your normal activities as told by your health care provider. Ask your health care provider what activities are safe for you. Do not lift anything that is heavier  than 10 lb (4.5 kg), or the limit that you are told, until your health care provider says that it is safe. Do not drive for 24 hours if you were given a sedative during your procedure. General instructions  Take over-the-counter and prescription medicines only as told by your health care provider. Do not take baths, swim, or use a hot tub until your health care provider approves. Ask your health care provider if you may take showers. You may only be allowed to take sponge baths. If directed, put ice on the affected area. To do this: Put ice in a plastic bag. Place a towel between your skin and the bag. Leave the ice on for 20 minutes, 2-3 times a day. Keep all follow-up visits as told by your health care provider. This is important. Contact a health care provider if: Your pain is not controlled with medicine. You have a fever. You have more redness, swelling, or pain around the puncture site. You have fluid or blood coming from the puncture site. Your puncture site feels warm to the touch. You have pus or a bad smell coming from the puncture site. Summary After the procedure, it is common to have mild pain, tenderness, swelling, and bruising. Follow instructions from your health care provider about how to take care of the puncture site and what activities are safe for you. Take over-the-counter and prescription medicines only as told by your health care provider. Contact a health care provider if you have any signs of infection, such as fluid or blood coming from the puncture site. This information is not intended to replace advice given to you by your health care provider. Make sure you discuss any questions you have with your health care provider. Document Revised: 12/19/2018 Document Reviewed: 12/19/2018 Elsevier Patient Education  2023 Elsevier Inc. 

## 2023-03-01 ENCOUNTER — Inpatient Hospital Stay: Payer: BC Managed Care – PPO | Attending: Hematology & Oncology

## 2023-03-01 VITALS — BP 127/78 | HR 72 | Temp 98.4°F | Resp 18

## 2023-03-01 DIAGNOSIS — D72829 Elevated white blood cell count, unspecified: Secondary | ICD-10-CM | POA: Diagnosis not present

## 2023-03-01 DIAGNOSIS — R768 Other specified abnormal immunological findings in serum: Secondary | ICD-10-CM | POA: Diagnosis not present

## 2023-03-01 DIAGNOSIS — Z79899 Other long term (current) drug therapy: Secondary | ICD-10-CM | POA: Insufficient documentation

## 2023-03-01 DIAGNOSIS — Z7952 Long term (current) use of systemic steroids: Secondary | ICD-10-CM | POA: Insufficient documentation

## 2023-03-01 DIAGNOSIS — D509 Iron deficiency anemia, unspecified: Secondary | ICD-10-CM | POA: Diagnosis not present

## 2023-03-01 LAB — SURGICAL PATHOLOGY

## 2023-03-01 MED ORDER — SODIUM CHLORIDE 0.9 % IV SOLN
300.0000 mg | Freq: Once | INTRAVENOUS | Status: AC
  Administered 2023-03-01: 300 mg via INTRAVENOUS
  Filled 2023-03-01: qty 300

## 2023-03-01 MED ORDER — SODIUM CHLORIDE 0.9 % IV SOLN: Freq: Once | INTRAVENOUS | Status: AC

## 2023-03-01 NOTE — Patient Instructions (Signed)
Iron Sucrose Injection What is this medication? IRON SUCROSE (EYE ern SOO krose) treats low levels of iron (iron deficiency anemia) in people with kidney disease. Iron is a mineral that plays an important role in making red blood cells, which carry oxygen from your lungs to the rest of your body. This medicine may be used for other purposes; ask your health care provider or pharmacist if you have questions. COMMON BRAND NAME(S): Venofer What should I tell my care team before I take this medication? They need to know if you have any of these conditions: Anemia not caused by low iron levels Heart disease High levels of iron in the blood Kidney disease Liver disease An unusual or allergic reaction to iron, other medications, foods, dyes, or preservatives Pregnant or trying to get pregnant Breastfeeding How should I use this medication? This medication is for infusion into a vein. It is given in a hospital or clinic setting. Talk to your care team about the use of this medication in children. While this medication may be prescribed for children as young as 2 years for selected conditions, precautions do apply. Overdosage: If you think you have taken too much of this medicine contact a poison control center or emergency room at once. NOTE: This medicine is only for you. Do not share this medicine with others. What if I miss a dose? Keep appointments for follow-up doses. It is important not to miss your dose. Call your care team if you are unable to keep an appointment. What may interact with this medication? Do not take this medication with any of the following: Deferoxamine Dimercaprol Other iron products This medication may also interact with the following: Chloramphenicol Deferasirox This list may not describe all possible interactions. Give your health care provider a list of all the medicines, herbs, non-prescription drugs, or dietary supplements you use. Also tell them if you smoke,  drink alcohol, or use illegal drugs. Some items may interact with your medicine. What should I watch for while using this medication? Visit your care team regularly. Tell your care team if your symptoms do not start to get better or if they get worse. You may need blood work done while you are taking this medication. You may need to follow a special diet. Talk to your care team. Foods that contain iron include: whole grains/cereals, dried fruits, beans, or peas, leafy green vegetables, and organ meats (liver, kidney). What side effects may I notice from receiving this medication? Side effects that you should report to your care team as soon as possible: Allergic reactions--skin rash, itching, hives, swelling of the face, lips, tongue, or throat Low blood pressure--dizziness, feeling faint or lightheaded, blurry vision Shortness of breath Side effects that usually do not require medical attention (report to your care team if they continue or are bothersome): Flushing Headache Joint pain Muscle pain Nausea Pain, redness, or irritation at injection site This list may not describe all possible side effects. Call your doctor for medical advice about side effects. You may report side effects to FDA at 1-800-FDA-1088. Where should I keep my medication? This medication is given in a hospital or clinic and will not be stored at home. NOTE: This sheet is a summary. It may not cover all possible information. If you have questions about this medicine, talk to your doctor, pharmacist, or health care provider.  2024 Elsevier/Gold Standard (2022-02-10 00:00:00)  

## 2023-03-02 ENCOUNTER — Encounter: Payer: Self-pay | Admitting: *Deleted

## 2023-03-02 ENCOUNTER — Encounter: Payer: Self-pay | Admitting: Hematology & Oncology

## 2023-03-04 ENCOUNTER — Encounter: Payer: Self-pay | Admitting: Medical-Surgical

## 2023-03-04 ENCOUNTER — Ambulatory Visit (INDEPENDENT_AMBULATORY_CARE_PROVIDER_SITE_OTHER): Payer: BC Managed Care – PPO | Admitting: Medical-Surgical

## 2023-03-04 VITALS — BP 129/83 | HR 105 | Resp 20 | Ht 67.0 in | Wt 358.0 lb

## 2023-03-04 DIAGNOSIS — M5441 Lumbago with sciatica, right side: Secondary | ICD-10-CM | POA: Diagnosis not present

## 2023-03-04 MED ORDER — PREDNISONE 50 MG PO TABS: 50.0000 mg | ORAL_TABLET | Freq: Every day | ORAL | 0 refills | Status: DC

## 2023-03-04 MED ORDER — HYDROCODONE-ACETAMINOPHEN 5-325 MG PO TABS: 1.0000 | ORAL_TABLET | Freq: Three times a day (TID) | ORAL | 0 refills | Status: DC | PRN

## 2023-03-04 NOTE — Progress Notes (Signed)
        Established patient visit  History, exam, impression, and plan:  1. Acute right-sided low back pain with right-sided sciatica Pleasant 27 year old accompanied by fianc presenting today for evaluation of acute right-sided low back pain that started approximately 5 days ago.  Notes that she had a bone marrow biopsy last Thursday which went well but was very uncomfortable.  She relaxed and rested over the weekend with no unusual activity.  On Monday, the pain started in her right lower back and has progressively worsened.  Now experiencing sharp, stabbing pains that extend down the posterior thigh to the back of the knee.  Last night, her pain was severe and she was unable to get comfortable or rest.  Has tried ice, heat, Flexeril, TENS unit, and a massage gun with no relief of symptoms.  Ambulating well however is guarded on the right side.  Pain at the area of the SI joint extending into the right buttock.  Tenderness in the area of discomfort.  No history of lumbar spinal issues and has seen orthopedics for this.  Does not have an orthopedic follow-up and has not sought other medical attention for the current symptoms.  Plan to treat with prednisone 50 mg daily x 5 days.  Continue conservative measures at home.  Adding low-dose Norco for severe pain management.  Adding home exercises.  If no improvement in 6 weeks of conservative measures, return for further evaluation.   Procedures performed this visit: None.  Return if symptoms worsen or fail to improve.  __________________________________ Thayer Ohm, DNP, APRN, FNP-BC Primary Care and Sports Medicine [facility] [place]

## 2023-03-06 ENCOUNTER — Other Ambulatory Visit: Payer: Self-pay | Admitting: Physician Assistant

## 2023-03-07 ENCOUNTER — Encounter (HOSPITAL_COMMUNITY): Payer: Self-pay | Admitting: Hematology & Oncology

## 2023-03-07 ENCOUNTER — Encounter: Payer: Self-pay | Admitting: *Deleted

## 2023-03-07 ENCOUNTER — Ambulatory Visit: Payer: BC Managed Care – PPO | Admitting: Physician Assistant

## 2023-03-07 LAB — SURGICAL PATHOLOGY

## 2023-03-08 ENCOUNTER — Inpatient Hospital Stay: Payer: BC Managed Care – PPO

## 2023-03-08 ENCOUNTER — Inpatient Hospital Stay (HOSPITAL_BASED_OUTPATIENT_CLINIC_OR_DEPARTMENT_OTHER): Payer: BC Managed Care – PPO | Admitting: Hematology & Oncology

## 2023-03-08 ENCOUNTER — Encounter: Payer: Self-pay | Admitting: Hematology & Oncology

## 2023-03-08 VITALS — BP 118/67 | HR 85

## 2023-03-08 VITALS — BP 129/89 | HR 101 | Temp 98.5°F | Resp 20 | Ht 67.0 in | Wt 358.0 lb

## 2023-03-08 DIAGNOSIS — Z7952 Long term (current) use of systemic steroids: Secondary | ICD-10-CM | POA: Diagnosis not present

## 2023-03-08 DIAGNOSIS — R768 Other specified abnormal immunological findings in serum: Secondary | ICD-10-CM | POA: Diagnosis not present

## 2023-03-08 DIAGNOSIS — D509 Iron deficiency anemia, unspecified: Secondary | ICD-10-CM

## 2023-03-08 DIAGNOSIS — R112 Nausea with vomiting, unspecified: Secondary | ICD-10-CM | POA: Diagnosis not present

## 2023-03-08 DIAGNOSIS — M25511 Pain in right shoulder: Secondary | ICD-10-CM | POA: Diagnosis not present

## 2023-03-08 DIAGNOSIS — D72823 Leukemoid reaction: Secondary | ICD-10-CM

## 2023-03-08 DIAGNOSIS — D72824 Basophilia: Secondary | ICD-10-CM

## 2023-03-08 DIAGNOSIS — D72829 Elevated white blood cell count, unspecified: Secondary | ICD-10-CM | POA: Diagnosis not present

## 2023-03-08 DIAGNOSIS — Z79899 Other long term (current) drug therapy: Secondary | ICD-10-CM | POA: Diagnosis not present

## 2023-03-08 LAB — CMP (CANCER CENTER ONLY)
ALT: 12 U/L (ref 0–44)
AST: 10 U/L — ABNORMAL LOW (ref 15–41)
Albumin: 3.9 g/dL (ref 3.5–5.0)
Alkaline Phosphatase: 63 U/L (ref 38–126)
Anion gap: 8 (ref 5–15)
BUN: 12 mg/dL (ref 6–20)
CO2: 25 mmol/L (ref 22–32)
Calcium: 8.7 mg/dL — ABNORMAL LOW (ref 8.9–10.3)
Chloride: 106 mmol/L (ref 98–111)
Creatinine: 0.68 mg/dL (ref 0.44–1.00)
GFR, Estimated: >60 mL/min (ref >60)
Glucose, Bld: 92 mg/dL (ref 70–99)
Potassium: 3.4 mmol/L — ABNORMAL LOW (ref 3.5–5.1)
Sodium: 139 mmol/L (ref 135–145)
Total Bilirubin: 0.3 mg/dL (ref 0.3–1.2)
Total Protein: 6.4 g/dL — ABNORMAL LOW (ref 6.5–8.1)

## 2023-03-08 LAB — CBC WITH DIFFERENTIAL (CANCER CENTER ONLY)
Abs Immature Granulocytes: 0.12 10*3/uL — ABNORMAL HIGH (ref 0.00–0.07)
Basophils Absolute: 0.1 10*3/uL (ref 0.0–0.1)
Basophils Relative: 0 %
Eosinophils Absolute: 0 10*3/uL (ref 0.0–0.5)
Eosinophils Relative: 0 %
HCT: 37.6 % (ref 36.0–46.0)
Hemoglobin: 12 g/dL (ref 12.0–15.0)
Immature Granulocytes: 1 %
Lymphocytes Relative: 25 %
Lymphs Abs: 4.4 10*3/uL — ABNORMAL HIGH (ref 0.7–4.0)
MCH: 25.5 pg — ABNORMAL LOW (ref 26.0–34.0)
MCHC: 31.9 g/dL (ref 30.0–36.0)
MCV: 80 fL (ref 80.0–100.0)
Monocytes Absolute: 0.9 10*3/uL (ref 0.1–1.0)
Monocytes Relative: 5 %
Neutro Abs: 11.8 10*3/uL — ABNORMAL HIGH (ref 1.7–7.7)
Neutrophils Relative %: 69 %
Platelet Count: 168 10*3/uL (ref 150–400)
RBC: 4.7 MIL/uL (ref 3.87–5.11)
RDW: 14.6 % (ref 11.5–15.5)
Smear Review: NORMAL
WBC Count: 17.2 10*3/uL — ABNORMAL HIGH (ref 4.0–10.5)
nRBC: 0 % (ref 0.0–0.2)

## 2023-03-08 LAB — C-REACTIVE PROTEIN: CRP: 0.9 mg/dL (ref <1.0)

## 2023-03-08 LAB — LACTATE DEHYDROGENASE: LDH: 108 U/L (ref 98–192)

## 2023-03-08 MED ORDER — SODIUM CHLORIDE 0.9 % IV SOLN
300.0000 mg | Freq: Once | INTRAVENOUS | Status: AC
  Administered 2023-03-08: 300 mg via INTRAVENOUS
  Filled 2023-03-08: qty 300

## 2023-03-08 MED ORDER — SODIUM CHLORIDE 0.9 % IV SOLN: Freq: Once | INTRAVENOUS | Status: AC

## 2023-03-08 NOTE — Patient Instructions (Signed)
Iron Sucrose Injection What is this medication? IRON SUCROSE (EYE ern SOO krose) treats low levels of iron (iron deficiency anemia) in people with kidney disease. Iron is a mineral that plays an important role in making red blood cells, which carry oxygen from your lungs to the rest of your body. This medicine may be used for other purposes; ask your health care provider or pharmacist if you have questions. COMMON BRAND NAME(S): Venofer What should I tell my care team before I take this medication? They need to know if you have any of these conditions: Anemia not caused by low iron levels Heart disease High levels of iron in the blood Kidney disease Liver disease An unusual or allergic reaction to iron, other medications, foods, dyes, or preservatives Pregnant or trying to get pregnant Breastfeeding How should I use this medication? This medication is for infusion into a vein. It is given in a hospital or clinic setting. Talk to your care team about the use of this medication in children. While this medication may be prescribed for children as young as 2 years for selected conditions, precautions do apply. Overdosage: If you think you have taken too much of this medicine contact a poison control center or emergency room at once. NOTE: This medicine is only for you. Do not share this medicine with others. What if I miss a dose? Keep appointments for follow-up doses. It is important not to miss your dose. Call your care team if you are unable to keep an appointment. What may interact with this medication? Do not take this medication with any of the following: Deferoxamine Dimercaprol Other iron products This medication may also interact with the following: Chloramphenicol Deferasirox This list may not describe all possible interactions. Give your health care provider a list of all the medicines, herbs, non-prescription drugs, or dietary supplements you use. Also tell them if you smoke,  drink alcohol, or use illegal drugs. Some items may interact with your medicine. What should I watch for while using this medication? Visit your care team regularly. Tell your care team if your symptoms do not start to get better or if they get worse. You may need blood work done while you are taking this medication. You may need to follow a special diet. Talk to your care team. Foods that contain iron include: whole grains/cereals, dried fruits, beans, or peas, leafy green vegetables, and organ meats (liver, kidney). What side effects may I notice from receiving this medication? Side effects that you should report to your care team as soon as possible: Allergic reactions--skin rash, itching, hives, swelling of the face, lips, tongue, or throat Low blood pressure--dizziness, feeling faint or lightheaded, blurry vision Shortness of breath Side effects that usually do not require medical attention (report to your care team if they continue or are bothersome): Flushing Headache Joint pain Muscle pain Nausea Pain, redness, or irritation at injection site This list may not describe all possible side effects. Call your doctor for medical advice about side effects. You may report side effects to FDA at 1-800-FDA-1088. Where should I keep my medication? This medication is given in a hospital or clinic. It will not be stored at home. NOTE: This sheet is a summary. It may not cover all possible information. If you have questions about this medicine, talk to your doctor, pharmacist, or health care provider.  2024 Elsevier/Gold Standard (2023-01-07 00:00:00)

## 2023-03-09 ENCOUNTER — Encounter: Payer: Self-pay | Admitting: Hematology & Oncology

## 2023-03-09 ENCOUNTER — Telehealth: Payer: Self-pay

## 2023-03-09 NOTE — Telephone Encounter (Signed)
-----   Message from Josph Macho sent at 03/09/2023  6:09 AM EDT ----- Please call and let her know that the C-reactive protein is normal.  I have to believe  this is because she had been on steroids recently.  Thanks.  Cindee Lame

## 2023-03-09 NOTE — Telephone Encounter (Signed)
Advised via MyChart.

## 2023-03-09 NOTE — Progress Notes (Addendum)
Hematology and Oncology Follow Up Visit  JASON HAUGE [id number] 09/04/1995 27 y.o. 03/09/2023   Principle Diagnosis:  Iron deficiency anemia; elevated IgA; leukocytosis  Current Therapy:   IV iron-Venofer given on 03/08/2023     Interim History:  Ms. Kroening is back for follow-up.  This is her second office visit.  It was unclear as to what was going on with her when we first saw her.  We actually did do a bone marrow biopsy on her because of her leukocytosis and elevated IgE a level.  The bone marrow test was done on 02/25/2023.  The pathology report [id number]) normocellular marrow with mild myeloid hyperplasia.  There was scant iron stores.  There is no immature cells noted.  There were no blast.  There were no plasma cells.  She good maturation of her myeloid cells.  We did extensive genetic studies.  She was negative for BCR/ABL.  Her cytogenetics were normal.  We did find that she had a low iron when she came in.  Back on 02/09/2023, her ferritin was 33 with an iron saturation of 8%.  As such, we have given her IV iron.  When we first saw her, her IgA level was 360 mg/dL.  She had a mildly elevated IgM level 250 mg/dL.  She had a normal vitamin B12 level 295.  Apparently, she recently was on some steroids.  Her C-reactive protein today was 0.9.  She still is not sure as to what is going on why she does not feel all that great.  Is hard to tell if there is actually any obvious hematologic issue.  I suppose that she may be a rare patient with a triple negative myeloproliferative neoplasm.  I will send off NGS studies and see if this may show [place] anything.  I also think that a ultrasound of her spleen but I would not be a bad idea.  She has had no rashes.  She has had no nausea or vomiting.  She has had no obvious change in bowel or bladder habits.  She has had no cough.  There is been no bleeding.  She has had no fever.  She has not noted any swollen lymph nodes.  Overall, I  would say that her performance status is probably ECOG 1.   Medications:  Current Outpatient Medications:    cyclobenzaprine (FLEXERIL) 10 MG tablet, Take by mouth 3 (three) times daily as needed., Disp: , Rfl:    HYDROcodone-acetaminophen (NORCO/VICODIN) 5-325 MG tablet, Take 1 tablet by mouth every 8 (eight) hours as needed for moderate pain., Disp: 15 tablet, Rfl: 0   methylphenidate (METADATE ER) 20 MG ER tablet, Take 1 tablet (20 mg total) by mouth daily., Disp: 30 tablet, Rfl: 0   omeprazole (PRILOSEC) 40 MG capsule, Take 1 capsule (40 mg total) by mouth 2 (two) times daily., Disp: 90 capsule, Rfl: 3   predniSONE (DELTASONE) 50 MG tablet, Take 1 tablet (50 mg total) by mouth daily., Disp: 5 tablet, Rfl: 0   sertraline (ZOLOFT) 100 MG tablet, Take 2 tablets (200 mg total) by mouth in the morning., Disp: 180 tablet, Rfl: 1   traZODone (DESYREL) 50 MG tablet, Take 2 tablets (100 mg total) by mouth at bedtime., Disp: 90 tablet, Rfl: 3   albuterol (VENTOLIN HFA) 108 (90 Base) MCG/ACT inhaler, Inhale 2 puffs into the lungs every 6 (six) hours as needed for wheezing or shortness of breath. (Patient not taking: Reported on 03/08/2023), Disp: 18 g, Rfl: 0  fluticasone (FLONASE) 50 MCG/ACT nasal spray, Place 1 spray into both nostrils daily. (Patient not taking: Reported on 03/08/2023), Disp: , Rfl:    metoCLOPramide (REGLAN) 10 MG tablet, Take 1 tablet (10 mg total) by mouth 3 (three) times daily as needed for nausea (Patient not taking: Reported on 03/08/2023), Disp: 30 tablet, Rfl: 0  Allergies:  Allergies  Allergen Reactions   Peanut-Containing Drug Products Swelling and Other (See Comments)    Childhood reaction- lips became swollen/breathing not affected, though    Past Medical History, Surgical history, Social history, and Family History were reviewed and updated.  Review of Systems: Review of Systems  Constitutional:  Positive for fatigue.  HENT:  Negative.    Eyes: Negative.    Respiratory:  Positive for shortness of breath.   Cardiovascular: Negative.   Gastrointestinal: Negative.   Endocrine: Negative.   Genitourinary: Negative.    Musculoskeletal:  Positive for arthralgias and myalgias.  Skin: Negative.   Neurological:  Positive for headaches.  Hematological: Negative.   Psychiatric/Behavioral:  The patient is nervous/anxious.     Physical Exam:  height is 5\' 7"  (1.702 m) and weight is 358 lb (162.4 kg) (abnormal). Her oral temperature is 98.5 F (36.9 C). Her blood pressure is 129/89 and her pulse is 101 (abnormal). Her respiration is 20.   Wt Readings from Last 3 Encounters:  03/08/23 (!) 358 lb (162.4 kg)  03/04/23 (!) 358 lb (162.4 kg)  02/25/23 (!) 340 lb (154.2 kg)    Physical Exam Vitals reviewed.  HENT:     Head: Normocephalic and atraumatic.  Eyes:     Pupils: Pupils are equal, round, and reactive to light.  Cardiovascular:     Rate and Rhythm: Normal rate and regular rhythm.     Heart sounds: Normal heart sounds.  Pulmonary:     Effort: Pulmonary effort is normal.     Breath sounds: Normal breath sounds.  Abdominal:     General: Bowel sounds are normal.     Palpations: Abdomen is soft.  Musculoskeletal:        General: No tenderness or deformity. Normal range of motion.     Cervical back: Normal range of motion.  Lymphadenopathy:     Cervical: No cervical adenopathy.  Skin:    General: Skin is warm and dry.     Findings: No erythema or rash.  Neurological:     Mental Status: She is alert and oriented to person, place, and time.  Psychiatric:        Behavior: Behavior normal.        Thought Content: Thought content normal.        Judgment: Judgment normal.      Lab Results  Component Value Date   WBC 17.2 (H) 03/08/2023   HGB 12.0 03/08/2023   HCT 37.6 03/08/2023   MCV 80.0 03/08/2023   PLT 168 03/08/2023     Chemistry      Component Value Date/Time   NA 139 03/08/2023 0901   NA 141 05/23/2018 1500   K 3.4  (L) 03/08/2023 0901   CL 106 03/08/2023 0901   CO2 25 03/08/2023 0901   BUN 12 03/08/2023 0901   BUN 9 05/23/2018 1500   CREATININE 0.68 03/08/2023 0901   CREATININE 0.67 12/06/2022 1453      Component Value Date/Time   CALCIUM 8.7 (L) 03/08/2023 0901   ALKPHOS 63 03/08/2023 0901   AST 10 (L) 03/08/2023 0901   ALT 12 03/08/2023 0901  BILITOT 0.3 03/08/2023 0901     On review of her peripheral blood smear, she has a normochromic normocytic population of red blood cells.  She has no nucleated red blood cells.  I see no rouleaux formation.  There are no schistocytes or spherocytes.  I see no target cells.  White blood cells are increased in number.  These are mature white blood cells.  I see no hypersegmented polys.  I see no immature lymphoid cells.  Platelets are adequate in number and size.  Platelets are well granulated.  Impression and Plan: Ms. Elbe is a very nice 27 year old white female.  She has iron deficiency anemia.  This, we know for sure about her iron studies.  She is getting IV iron.  She will get a dose of iron today.  I think the bone marrow is reassuring that there is no obvious hematologic malignancy.  However, she still has the leukocytosis.  This could easily be a leukemoid reaction.  Again, she was recently on steroids which could be the etiology of the higher white cell count.  The fact that the cytogenetics and BCR/ABL was normal is also reassuring that there is no occult myeloproliferative process.  Just to be safe, we will send off the NGS studies for a mild proliferative process.  I think the elevated IgA is just a chronic issue.  I do not think this is indicative of any plasma cell disorder.  We will go ahead and plan to get her back in a few months.  We will see how her iron studies look.  She is in for IV iron today.  We will go ahead and get an ultrasound of her spleen to make sure this is not enlarged.  She is worried about having arthritis.  I will  try to send off a bone scan on her.  I suppose that she could always be referred to a Rheumatologist.  However, this is something that I would think her family doctor would have to initiate.   Josph Macho, MD 7/24/20246:57 AM

## 2023-03-10 ENCOUNTER — Other Ambulatory Visit: Payer: Self-pay | Admitting: *Deleted

## 2023-03-10 ENCOUNTER — Encounter: Payer: Self-pay | Admitting: Medical-Surgical

## 2023-03-10 ENCOUNTER — Telehealth: Payer: Self-pay

## 2023-03-10 ENCOUNTER — Ambulatory Visit (HOSPITAL_COMMUNITY): Admission: RE | Admit: 2023-03-10 | Payer: BC Managed Care – PPO | Source: Ambulatory Visit

## 2023-03-10 LAB — IGG, IGA, IGM
IgA: 332 mg/dL (ref 87–352)
IgM (Immunoglobulin M), Srm: 242 mg/dL — ABNORMAL HIGH (ref 26–217)

## 2023-03-10 LAB — HGB FRACTIONATION CASCADE
Hgb A2: 2.2 % (ref 1.8–3.2)
Hgb F: 0 % (ref 0.0–2.0)

## 2023-03-10 NOTE — Telephone Encounter (Signed)
Advised via MyChart.

## 2023-03-10 NOTE — Telephone Encounter (Signed)
-----   Message from Josph Macho sent at 03/10/2023  7:58 AM EDT ----- Please call and let her know that the immunoglobulins are improving.  The IgM level is minimally elevated now.  The IgA level is normal.  Thanks.  Cindee Lame

## 2023-03-11 ENCOUNTER — Other Ambulatory Visit (HOSPITAL_BASED_OUTPATIENT_CLINIC_OR_DEPARTMENT_OTHER): Payer: Self-pay

## 2023-03-11 ENCOUNTER — Other Ambulatory Visit: Payer: Self-pay | Admitting: Medical-Surgical

## 2023-03-11 MED ORDER — PREDNISONE 10 MG PO TABS
10.0000 mg | ORAL_TABLET | Freq: Every day | ORAL | 0 refills | Status: DC
  Filled 2023-03-11: qty 4, 4d supply, fill #0

## 2023-03-11 MED ORDER — PREDNISONE 10 MG PO TABS: 10.0000 mg | ORAL_TABLET | Freq: Every day | ORAL | 0 refills | Status: DC

## 2023-03-11 NOTE — Telephone Encounter (Signed)
You could add 10mg  for another 4 days until she sees me.

## 2023-03-12 ENCOUNTER — Ambulatory Visit (HOSPITAL_BASED_OUTPATIENT_CLINIC_OR_DEPARTMENT_OTHER)
Admission: RE | Admit: 2023-03-12 | Discharge: 2023-03-12 | Disposition: A | Discharge status: Home / Self Care | Payer: BC Managed Care – PPO | Source: Ambulatory Visit | Attending: Hematology & Oncology | Admitting: Hematology & Oncology

## 2023-03-12 DIAGNOSIS — M25511 Pain in right shoulder: Secondary | ICD-10-CM | POA: Insufficient documentation

## 2023-03-12 DIAGNOSIS — R112 Nausea with vomiting, unspecified: Secondary | ICD-10-CM | POA: Insufficient documentation

## 2023-03-12 DIAGNOSIS — D72829 Elevated white blood cell count, unspecified: Secondary | ICD-10-CM | POA: Diagnosis not present

## 2023-03-14 ENCOUNTER — Ambulatory Visit (INDEPENDENT_AMBULATORY_CARE_PROVIDER_SITE_OTHER): Payer: BC Managed Care – PPO | Admitting: Physician Assistant

## 2023-03-14 ENCOUNTER — Encounter: Payer: Self-pay | Admitting: *Deleted

## 2023-03-14 ENCOUNTER — Other Ambulatory Visit (HOSPITAL_BASED_OUTPATIENT_CLINIC_OR_DEPARTMENT_OTHER): Payer: Self-pay

## 2023-03-14 VITALS — BP 129/48 | HR 110 | Ht 67.0 in | Wt 354.1 lb

## 2023-03-14 DIAGNOSIS — D509 Iron deficiency anemia, unspecified: Secondary | ICD-10-CM | POA: Diagnosis not present

## 2023-03-14 DIAGNOSIS — R718 Other abnormality of red blood cells: Secondary | ICD-10-CM

## 2023-03-14 DIAGNOSIS — D72823 Leukemoid reaction: Secondary | ICD-10-CM

## 2023-03-14 DIAGNOSIS — R197 Diarrhea, unspecified: Secondary | ICD-10-CM | POA: Diagnosis not present

## 2023-03-14 DIAGNOSIS — D72824 Basophilia: Secondary | ICD-10-CM

## 2023-03-14 DIAGNOSIS — R41840 Attention and concentration deficit: Secondary | ICD-10-CM

## 2023-03-14 DIAGNOSIS — M5441 Lumbago with sciatica, right side: Secondary | ICD-10-CM

## 2023-03-14 DIAGNOSIS — D72829 Elevated white blood cell count, unspecified: Secondary | ICD-10-CM

## 2023-03-14 DIAGNOSIS — G8929 Other chronic pain: Secondary | ICD-10-CM

## 2023-03-14 DIAGNOSIS — F902 Attention-deficit hyperactivity disorder, combined type: Secondary | ICD-10-CM

## 2023-03-14 DIAGNOSIS — R7989 Other specified abnormal findings of blood chemistry: Secondary | ICD-10-CM | POA: Diagnosis not present

## 2023-03-14 DIAGNOSIS — M5136 Other intervertebral disc degeneration, lumbar region: Secondary | ICD-10-CM

## 2023-03-14 DIAGNOSIS — M51369 Other intervertebral disc degeneration, lumbar region without mention of lumbar back pain or lower extremity pain: Secondary | ICD-10-CM

## 2023-03-14 MED ORDER — CYCLOBENZAPRINE HCL 10 MG PO TABS
10.0000 mg | ORAL_TABLET | Freq: Three times a day (TID) | ORAL | 0 refills | Status: DC | PRN
Start: 2023-03-14 — End: 2023-05-11
  Filled 2023-03-14: qty 30, 10d supply, fill #0

## 2023-03-14 NOTE — Patient Instructions (Addendum)
MRI of lumbar spine and abdomen.  New hematology referral.

## 2023-03-14 NOTE — Progress Notes (Signed)
Established Patient Office Visit  Subjective   Patient ID: Emma Boyer, female    DOB: 10/18/1995  Age: 27 y.o. MRN: [id number]  Chief Complaint  Patient presents with   Back Pain    Not in pain right now patient stated.Saw Joy on Monday last week.Pain didn't go away until last night.Usually last 2-3 days.Spasms were like every 4 months and now they are monthly    HPI Pt is a 27 yo obese female who presents to the clinic to follow up on low back pain radiating into right leg. She has had this for years now, since 2014. Flares uses to be a few times a year but lately they have been once a month. She would like something evaluated with this. Prednisone is really the only thing that helps. She had xrays of lumbar spine in 2023 with mild lumbar DDD and had last MRI of lumbar spine in 2014   See below.   Narrative    Additional findings at individual levels are as follows:  L1-2: Unremarkable.  L2-3: Disc bulge, no impingement. Small synovial cyst adjacent to the right facet joint is not in a position to cause impingement.  L3-4: Mild left subarticular lateral recess stenosis due to left eccentric disc bulge.  L4-5: Mild left subarticular lateral recess stenosis and borderline left foraminal stenosis due to disc bulge and minimal intervertebral spurring.  L5-S1: Unremarkable.  IMPRESSION: 1. Mild left subarticular lateral recess stenosis at L4-5 and L5-S1 due to disc bulges. 2. Leftward lumbar rotary scoliosis. 3. Cystic right adnexal lesion could be further characterized by pelvic sonography, if clinically warranted.   She wanted to follow up on hematology referral as well that she got for leukocytosis. She was not happy with how she was streated and how she did not get any real answers. She did get iron infusion and was told she had leukomoid reaction that was not dangerous. [place] of spleen normal.   Continues to have GI symptoms. Wonders about MRI of abdomen to look for  chrons.  Doing well with ADHD medication. Needs refills.   .. 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   Pseudopapilledema    Shoulder pain    Acute right-sided low back pain without sciatica 04/20/2013   Abnormal weight gain 01/27/2015   Migraine headache 01/27/2015   History of migraine headaches 08/29/2015   De Quervain's tenosynovitis, bilateral 11/21/2015   Hemoptysis 09/07/2016   Temporomandibular joint arthralgia, right 04/25/2017   H/O degenerative disc disease 02/07/2019   Acute suppurative inflammation of sphenoidal sinus 10/13/2020   Empty sella turcica (HCC) 10/13/2020   Benign paroxysmal positional vertigo, right 10/13/2020   Insomnia 10/13/2020   Anxiety 10/13/2020   Nausea and vomiting 04/15/2021   Class 3 severe obesity due to excess calories without serious comorbidity with body mass index (BMI) of 50.0 to 59.9 in adult (HCC) 04/15/2021   Gastroesophageal reflux disease 04/15/2021   Strain of lumbar region 11/19/2021   Attention deficit hyperactivity disorder (ADHD), combined type 11/22/2021   Elevated blood pressure reading in office without diagnosis of hypertension 10/20/2022   Diarrhea 11/04/2022   Abdominal pain 11/04/2022   Rectal bleeding 11/04/2022   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   Bloating 12/06/2022   ESR raised 12/06/2022   Elevated C-reactive protein (CRP) 12/06/2022   Leukocytosis 02/01/2023   Abnormal red blood cells 02/01/2023   Abnormal CBC 02/01/2023  Anemia, iron deficiency 02/22/2023   Chronic bilateral low back pain with right-sided sciatica 03/16/2023   Bulge of lumbar disc without myelopathy 03/16/2023   Resolved Ambulatory Problems    Diagnosis Date Noted   OTITIS MEDIA, SEROUS, ACUTE, LEFT 05/18/2009   Acute pharyngitis 04/28/2010   URI 05/18/2009   Preventive measure 09/08/2012   Cerumen impaction 01/14/2014    Influenza A 09/23/2015   Viral syndrome 10/06/2018   Past Medical History:  Diagnosis Date   ADHD    Benign paroxysmal positional vertigo    Bulging lumbar disc 08/16/2012   Dry eye syndrome    HA (headache)    History of anal fissures    Low back pain    Neck pain    Pneumonia    Vision abnormalities     ROS See HPI.    Objective:     BP (!) 129/48   Pulse (!) 110   Ht 5\' 7"  (1.702 m)   Wt (!) 354 lb 1.9 oz (160.6 kg)   LMP 02/14/2023   SpO2 98%   BMI 55.46 kg/m  BP Readings from Last 3 Encounters:  03/14/23 (!) 129/48  03/08/23 118/67  03/08/23 129/89   Wt Readings from Last 3 Encounters:  03/14/23 (!) 354 lb 1.9 oz (160.6 kg)  03/08/23 (!) 358 lb (162.4 kg)  03/04/23 (!) 358 lb (162.4 kg)      Physical Exam Constitutional:      Appearance: Normal appearance. She is obese.  HENT:     Head: Normocephalic.  Cardiovascular:     Rate and Rhythm: Normal rate and regular rhythm.     Pulses: Normal pulses.  Pulmonary:     Effort: Pulmonary effort is normal.  Musculoskeletal:     Right lower leg: No edema.     Left lower leg: No edema.  Neurological:     General: No focal deficit present.     Mental Status: She is alert and oriented to person, place, and time.  Psychiatric:     Comments: frustrated        Assessment & Plan:  Marland KitchenMarland KitchenMadicyn was seen today for back pain.  Diagnoses and all orders for this visit:  Leukemoid reaction -     Ambulatory referral to Hematology / Oncology  Basophilic leukocytosis -     ANA,IFA RA Diag Pnl w/rflx Tit/Patn -     CBC w/Diff/Platelet -     TSH -     C-reactive protein -     Sed Rate (ESR) -     Ambulatory referral to Hematology / Oncology  Diarrhea, unspecified type  Abnormal CBC -     Ambulatory referral to Hematology / Oncology  Abnormal red blood cells -     Ambulatory referral to Hematology / Oncology  Iron deficiency anemia, unspecified iron deficiency anemia type -     Ambulatory referral to  Hematology / Oncology  Difficulty concentrating -     methylphenidate (METADATE ER) 20 MG ER tablet; Take 1 tablet (20 mg total) by mouth daily.  Attention deficit hyperactivity disorder (ADHD), combined type -     methylphenidate (METADATE ER) 20 MG ER tablet; Take 1 tablet (20 mg total) by mouth daily. -     methylphenidate (METADATE ER) 20 MG ER tablet; Take 1 tablet (20 mg total) by mouth daily. -     methylphenidate (METADATE ER) 20 MG ER tablet; Take 1 tablet (20 mg total) by mouth daily.  Chronic bilateral low back pain with  right-sided sciatica -     cyclobenzaprine (FLEXERIL) 10 MG tablet; Take 1 tablet (10 mg total) by mouth 3 (three) times daily as needed. -     MR LUMBAR SPINE WO CONTRAST; Future  Bulge of lumbar disc without myelopathy -     cyclobenzaprine (FLEXERIL) 10 MG tablet; Take 1 tablet (10 mg total) by mouth 3 (three) times daily as needed. -     MR LUMBAR SPINE WO CONTRAST; Future   Needs to follow up with GI for MRI of abdomen.   MRI of lumbar spine ordered to assess due to worsening symptoms. Discussed tens unit, core strengthening  Flexeril refilled  New referral for hematology made Labs ordered for repeat but wait 2 weeks after prednisone  Concerta refilled for 3 months   Spent 55 minutes with patient in chart review, discussing plan and coordinating care.    Tandy Gaw, PA-C

## 2023-03-16 ENCOUNTER — Other Ambulatory Visit (HOSPITAL_BASED_OUTPATIENT_CLINIC_OR_DEPARTMENT_OTHER): Payer: Self-pay

## 2023-03-16 DIAGNOSIS — M5136 Other intervertebral disc degeneration, lumbar region: Secondary | ICD-10-CM | POA: Insufficient documentation

## 2023-03-16 DIAGNOSIS — M51369 Other intervertebral disc degeneration, lumbar region without mention of lumbar back pain or lower extremity pain: Secondary | ICD-10-CM | POA: Insufficient documentation

## 2023-03-16 DIAGNOSIS — G8929 Other chronic pain: Secondary | ICD-10-CM | POA: Insufficient documentation

## 2023-03-16 MED ORDER — METHYLPHENIDATE HCL ER 20 MG PO TBCR
20.0000 mg | EXTENDED_RELEASE_TABLET | Freq: Every day | ORAL | 0 refills | Status: DC
Start: 2023-03-16 — End: 2023-05-11
  Filled 2023-03-16 (×2): qty 30, 30d supply, fill #0

## 2023-03-16 MED ORDER — METHYLPHENIDATE HCL ER 20 MG PO TBCR
20.0000 mg | EXTENDED_RELEASE_TABLET | Freq: Every day | ORAL | 0 refills | Status: DC
Start: 2023-05-16 — End: 2023-05-11

## 2023-03-16 MED ORDER — METHYLPHENIDATE HCL ER 20 MG PO TBCR
20.0000 mg | EXTENDED_RELEASE_TABLET | Freq: Every day | ORAL | 0 refills | Status: DC
Start: 2023-04-16 — End: 2023-05-11

## 2023-03-18 ENCOUNTER — Encounter: Payer: Self-pay | Admitting: Physician Assistant

## 2023-03-20 ENCOUNTER — Encounter: Payer: Self-pay | Admitting: Hematology & Oncology

## 2023-03-20 ENCOUNTER — Other Ambulatory Visit (HOSPITAL_COMMUNITY): Payer: Self-pay

## 2023-03-22 ENCOUNTER — Other Ambulatory Visit (HOSPITAL_BASED_OUTPATIENT_CLINIC_OR_DEPARTMENT_OTHER): Payer: Self-pay

## 2023-03-22 ENCOUNTER — Telehealth: Payer: Self-pay

## 2023-03-22 MED ORDER — PREDNISONE 20 MG PO TABS
40.0000 mg | ORAL_TABLET | Freq: Every day | ORAL | 0 refills | Status: DC
  Filled 2023-03-22: qty 10, 5d supply, fill #0

## 2023-03-22 NOTE — Telephone Encounter (Signed)
That she does have evidence of degenerative disc disease I think she would benefit from formal physical therapy they can do multiple modalities to try to improve her pain and range of motion.  I can also send over a 5-day burst of prednisone just for the short-term but we cannot stay on it for prolonged period see if she would be okay with PT here or at another location that is convenient for her.  Meds ordered this encounter  Medications   predniSONE (DELTASONE) 20 MG tablet    Sig: Take 2 tablets (40 mg total) by mouth daily with breakfast.    Dispense:  10 tablet    Refill:  0

## 2023-03-22 NOTE — Telephone Encounter (Signed)
Patient was called regarding her request to change the imaging center ([facility] to [facility]) after obtaining an authorization from the insurance for MRI.   During the call the patient stated she was informed by [facility] imaging center she will have to wait until Oct since she had an iron transfusion on 03/01/23. Patient is unsure if she can wait 3 months due to the constant pain in her back. She mentioned that prednisone burst usually works. However, Lesly Rubenstein wanted her to complete labs and did not want the results to be altered by the use of prednisone. Patient also mentioned that the pain in her back started after she had a bone marrow biopsy.   Patient is upset because she was not informed that having the IV infusion would cause her any delays to having the MRI. Requesting recommendations from the provider.

## 2023-03-23 NOTE — Telephone Encounter (Signed)
Attemtped call to patient. Left a voice mail message requesting a return call.

## 2023-03-24 ENCOUNTER — Other Ambulatory Visit (HOSPITAL_COMMUNITY): Payer: BC Managed Care – PPO

## 2023-03-24 ENCOUNTER — Encounter (HOSPITAL_COMMUNITY): Payer: Self-pay

## 2023-03-25 ENCOUNTER — Other Ambulatory Visit (HOSPITAL_BASED_OUTPATIENT_CLINIC_OR_DEPARTMENT_OTHER): Payer: Self-pay

## 2023-03-25 ENCOUNTER — Other Ambulatory Visit: Payer: Self-pay | Admitting: Family Medicine

## 2023-03-25 MED ORDER — CELECOXIB 200 MG PO CAPS
200.0000 mg | ORAL_CAPSULE | Freq: Two times a day (BID) | ORAL | 0 refills | Status: DC | PRN
  Filled 2023-03-25: qty 60, 30d supply, fill #0

## 2023-03-25 NOTE — Telephone Encounter (Signed)
I did send over prescription for an anti-inflammatory if she can take those.  It look like her kidney function was normal and it does not look like she is on any blood thinners or had any allergic reaction so it should be safe and less she has not other known contraindication

## 2023-03-25 NOTE — Telephone Encounter (Signed)
Attempted call to patient. Left a voice mail message requesting a return call.  

## 2023-03-25 NOTE — Progress Notes (Unsigned)
I did send over prescription for an anti-inflammatory if she can take those.  It look like her kidney function was normal and it does not look like she is on any blood thinners or had any allergic reaction so it should be safe and less she has not other known contraindication

## 2023-03-28 DIAGNOSIS — D72829 Elevated white blood cell count, unspecified: Secondary | ICD-10-CM | POA: Diagnosis not present

## 2023-03-28 NOTE — Telephone Encounter (Signed)
 Attemtped call to patient. Left a voice mail message requesting a return call.

## 2023-03-30 NOTE — Progress Notes (Signed)
Emma Boyer,   Inflammatory markers still elevated.  ANA and rheumatoid markers negative.  Thyroid looks good.

## 2023-04-05 ENCOUNTER — Telehealth: Payer: BC Managed Care – PPO | Admitting: Family Medicine

## 2023-04-05 DIAGNOSIS — R102 Pelvic and perineal pain: Secondary | ICD-10-CM

## 2023-04-05 NOTE — Progress Notes (Signed)
Because the private nature and inability to access this in person- it is best to have you seen in person to make sure we treat this appropriately. Your condition warrants further evaluation and I recommend that you be seen in a face to face visit. You can call your PCP and or go to the the nearest Urgent Care for evaluation and treatment.   NOTE: There will be NO CHARGE for this eVisit   If you are having a true medical emergency please call 911.

## 2023-04-20 ENCOUNTER — Encounter: Payer: Self-pay | Admitting: Hematology & Oncology

## 2023-04-20 ENCOUNTER — Other Ambulatory Visit (HOSPITAL_BASED_OUTPATIENT_CLINIC_OR_DEPARTMENT_OTHER): Payer: Self-pay

## 2023-05-02 ENCOUNTER — Telehealth (HOSPITAL_BASED_OUTPATIENT_CLINIC_OR_DEPARTMENT_OTHER): Payer: Self-pay

## 2023-05-11 ENCOUNTER — Ambulatory Visit: Payer: BC Managed Care – PPO | Admitting: Physician Assistant

## 2023-05-11 ENCOUNTER — Other Ambulatory Visit (HOSPITAL_BASED_OUTPATIENT_CLINIC_OR_DEPARTMENT_OTHER): Payer: Self-pay

## 2023-05-11 ENCOUNTER — Encounter: Payer: Self-pay | Admitting: Physician Assistant

## 2023-05-11 VITALS — BP 128/88 | HR 95 | Ht 67.0 in | Wt 360.0 lb

## 2023-05-11 DIAGNOSIS — F902 Attention-deficit hyperactivity disorder, combined type: Secondary | ICD-10-CM | POA: Diagnosis not present

## 2023-05-11 DIAGNOSIS — M5136 Other intervertebral disc degeneration, lumbar region: Secondary | ICD-10-CM

## 2023-05-11 DIAGNOSIS — G4700 Insomnia, unspecified: Secondary | ICD-10-CM

## 2023-05-11 DIAGNOSIS — R41840 Attention and concentration deficit: Secondary | ICD-10-CM

## 2023-05-11 DIAGNOSIS — M5441 Lumbago with sciatica, right side: Secondary | ICD-10-CM

## 2023-05-11 DIAGNOSIS — G8929 Other chronic pain: Secondary | ICD-10-CM

## 2023-05-11 MED ORDER — TRAZODONE HCL 50 MG PO TABS
100.0000 mg | ORAL_TABLET | Freq: Every day | ORAL | 3 refills | Status: DC
Start: 2023-05-11 — End: 2023-11-22
  Filled 2023-05-11: qty 90, 45d supply, fill #0
  Filled 2023-05-25: qty 60, 30d supply, fill #0
  Filled 2023-06-22: qty 60, 30d supply, fill #1
  Filled 2023-07-21: qty 60, 30d supply, fill #2
  Filled 2023-08-22: qty 60, 30d supply, fill #3
  Filled 2023-09-19 – 2023-09-21 (×2): qty 60, 30d supply, fill #4
  Filled 2023-10-21: qty 60, 30d supply, fill #5

## 2023-05-11 MED ORDER — METHYLPHENIDATE HCL ER 20 MG PO TBCR: 20.0000 mg | EXTENDED_RELEASE_TABLET | Freq: Every day | ORAL | 0 refills | Status: DC

## 2023-05-11 MED ORDER — METHYLPHENIDATE HCL ER 20 MG PO TBCR
20.0000 mg | EXTENDED_RELEASE_TABLET | Freq: Every day | ORAL | 0 refills | Status: DC
Start: 2023-05-11 — End: 2023-08-19
  Filled 2023-05-11: qty 30, 30d supply, fill #0

## 2023-05-11 MED ORDER — METHYLPHENIDATE HCL ER 20 MG PO TBCR
20.0000 mg | EXTENDED_RELEASE_TABLET | Freq: Every day | ORAL | 0 refills | Status: DC
  Filled 2023-07-10: qty 30, 30d supply, fill #0

## 2023-05-11 MED ORDER — CYCLOBENZAPRINE HCL 10 MG PO TABS
10.0000 mg | ORAL_TABLET | Freq: Three times a day (TID) | ORAL | 3 refills | Status: DC | PRN
  Filled 2023-05-11: qty 30, 10d supply, fill #0
  Filled 2023-09-19: qty 30, 10d supply, fill #1

## 2023-05-12 ENCOUNTER — Encounter: Payer: Self-pay | Admitting: Physician Assistant

## 2023-05-12 NOTE — Progress Notes (Signed)
Established Patient Office Visit  Subjective   Patient ID: Emma Boyer, female    DOB: [birth year]  Age: 27 y.o. MRN: [id number]  No chief complaint on file.   HPI Pt is a 27 yo obese female who presents to the clinic for medication refills.   She is doing well with ADHD. She is on metadate ER without issues. She denies any concerning anxiety or depression. She is staying focused. She has some insomnia but resolved with trazodone as needed.   Continues to have chronic low back pain but managed with flexeril as needed with other OTC treatment.   .. 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   Pseudopapilledema    Shoulder pain    Acute right-sided low back pain without sciatica 04/20/2013   Abnormal weight gain 01/27/2015   Migraine headache 01/27/2015   History of migraine headaches 08/29/2015   De Quervain's tenosynovitis, bilateral 11/21/2015   Hemoptysis 09/07/2016   Temporomandibular joint arthralgia, right 04/25/2017   H/O degenerative disc disease 02/07/2019   Acute suppurative inflammation of sphenoidal sinus 10/13/2020   Empty sella turcica (HCC) 10/13/2020   Benign paroxysmal positional vertigo, right 10/13/2020   Insomnia 10/13/2020   Anxiety 10/13/2020   Nausea and vomiting 04/15/2021   Class 3 severe obesity due to excess calories without serious comorbidity with body mass index (BMI) of 50.0 to 59.9 in adult (HCC) 04/15/2021   Gastroesophageal reflux disease 04/15/2021   Strain of lumbar region 11/19/2021   Attention deficit hyperactivity disorder (ADHD), combined type 11/22/2021   Elevated blood pressure reading in office without diagnosis of hypertension 10/20/2022   Diarrhea 11/04/2022   Abdominal pain 11/04/2022   Rectal bleeding 11/04/2022   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   Bloating 12/06/2022   ESR raised 12/06/2022   Elevated C-reactive  protein (CRP) 12/06/2022   Leukocytosis 02/01/2023   Abnormal red blood cells 02/01/2023   Abnormal CBC 02/01/2023   Anemia, iron deficiency 02/22/2023   Chronic bilateral low back pain with right-sided sciatica 03/16/2023   Bulge of lumbar disc without myelopathy 03/16/2023   Resolved Ambulatory Problems    Diagnosis Date Noted   OTITIS MEDIA, SEROUS, ACUTE, LEFT 05/18/2009   Acute pharyngitis 04/28/2010   URI 05/18/2009   Preventive measure 09/08/2012   Cerumen impaction 01/14/2014   Influenza A 09/23/2015   Viral syndrome 10/06/2018   Past Medical History:  Diagnosis Date   ADHD    Benign paroxysmal positional vertigo    Bulging lumbar disc 08/16/2012   Dry eye syndrome    HA (headache)    History of anal fissures    Low back pain    Neck pain    Pneumonia    Vision abnormalities      ROS See HPI.    Objective:     BP 128/88   Pulse 95   Ht 5\' 7"  (1.702 m)   Wt (!) 360 lb (163.3 kg)   SpO2 96%   BMI 56.38 kg/m  BP Readings from Last 3 Encounters:  05/11/23 128/88  03/14/23 (!) 129/48  03/08/23 118/67   Wt Readings from Last 3 Encounters:  05/11/23 (!) 360 lb (163.3 kg)  03/14/23 (!) 354 lb 1.9 oz (160.6 kg)  03/08/23 (!) 358 lb (162.4 kg)      Physical Exam      Assessment & Plan:  Marland KitchenMarland KitchenDiagnoses and all orders for this visit:  Attention deficit hyperactivity disorder (ADHD), combined type -     methylphenidate (METADATE ER) 20 MG ER tablet; Take 1 tablet (20 mg total) by mouth daily. -     methylphenidate (METADATE ER) 20 MG ER tablet; Take 1 tablet (20 mg total) by mouth daily. -     methylphenidate (METADATE ER) 20 MG ER tablet; Take 1 tablet (20 mg total) by mouth daily.  Insomnia, unspecified type -     traZODone (DESYREL) 50 MG tablet; Take 2 tablets (100 mg total) by mouth at bedtime.  Chronic bilateral low back pain with right-sided sciatica -     cyclobenzaprine (FLEXERIL) 10 MG tablet; Take 1 tablet (10 mg total) by mouth 3 (three)  times daily as needed.  Bulge of lumbar disc without myelopathy -     cyclobenzaprine (FLEXERIL) 10 MG tablet; Take 1 tablet (10 mg total) by mouth 3 (three) times daily as needed.  Difficulty concentrating -     methylphenidate (METADATE ER) 20 MG ER tablet; Take 1 tablet (20 mg total) by mouth daily.  Flexeril refilled for chronic low back pain.   Metadate refilled for ADHD.  Trazodone for insomnia  No concerns. Vitals look great.    Return in about 6 months (around 11/08/2023), or if symptoms worsen or fail to improve.    Tandy Gaw, PA-C

## 2023-05-15 ENCOUNTER — Telehealth: Payer: BC Managed Care – PPO | Admitting: Family

## 2023-05-15 DIAGNOSIS — R21 Rash and other nonspecific skin eruption: Secondary | ICD-10-CM | POA: Diagnosis not present

## 2023-05-15 NOTE — Progress Notes (Signed)
E Visit for Rash  We are sorry that you are not feeling well. Here is how we plan to help!    Based on what you shared with me you may have a virus.  Avoid contact with pregnant women until a diagnosis is made.  Most viral rashes are contagious (especially if a fever is present).  You can return to work or school after the rash is gone or when your doctor says it is safe to return with the rash.     HOME CARE:  Take cool showers and avoid direct sunlight. Apply cool compress or wet dressings. Take a bath in an oatmeal bath.  Sprinkle content of one Aveeno packet under running faucet with comfortably warm water.  Bathe for 15-20 minutes, 1-2 times daily.  Pat dry with a towel. Do not rub the rash. Use hydrocortisone cream. Take an antihistamine like Benadryl for widespread rashes that itch.  The adult dose of Benadryl is 25-50 mg by mouth 4 times daily. Caution:  This type of medication may cause sleepiness.  Do not drink alcohol, drive, or operate dangerous machinery while taking antihistamines.  Do not take these medications if you have prostate enlargement.  Read package instructions thoroughly on all medications that you take.  GET HELP RIGHT AWAY IF:  Symptoms don't go away after treatment. Severe itching that persists. If you rash spreads or swells. If you rash begins to smell. If it blisters and opens or develops a yellow-brown crust. You develop a fever. You have a sore throat. You become short of breath.  MAKE SURE YOU:  Understand these instructions. Will watch your condition. Will get help right away if you are not doing well or get worse.  Thank you for choosing an e-visit.  Your e-visit answers were reviewed by a board certified advanced clinical practitioner to complete your personal care plan. Depending upon the condition, your plan could have included both over the counter or prescription medications.  Please review your pharmacy choice. Make sure the pharmacy  is open so you can pick up prescription now. If there is a problem, you may contact your provider through [employer] and have the prescription routed to another pharmacy.  Your safety is important to [place]. If you have drug allergies check your prescription carefully.   For the next 24 hours you can use MyChart to ask questions about today's visit, request a non-urgent call back, or ask for a work or school excuse. You will get an email in the next two days asking about your experience. I hope that your e-visit has been valuable and will speed your recovery.  Approximately 5 minutes was spent documenting and reviewing patient's chart.

## 2023-05-19 ENCOUNTER — Other Ambulatory Visit (HOSPITAL_BASED_OUTPATIENT_CLINIC_OR_DEPARTMENT_OTHER): Payer: Self-pay

## 2023-05-19 MED ORDER — CHLORHEXIDINE GLUCONATE 0.12 % MT SOLN
15.0000 mL | OROMUCOSAL | 0 refills | Status: DC
  Filled 2023-05-19: qty 473, 30d supply, fill #0

## 2023-05-20 ENCOUNTER — Other Ambulatory Visit (HOSPITAL_BASED_OUTPATIENT_CLINIC_OR_DEPARTMENT_OTHER): Payer: Self-pay

## 2023-05-20 MED ORDER — PENICILLIN V POTASSIUM 500 MG PO TABS
500.0000 mg | ORAL_TABLET | Freq: Three times a day (TID) | ORAL | 0 refills | Status: DC
  Filled 2023-05-20: qty 30, 10d supply, fill #0

## 2023-05-25 ENCOUNTER — Other Ambulatory Visit: Payer: Self-pay

## 2023-05-25 ENCOUNTER — Other Ambulatory Visit (HOSPITAL_BASED_OUTPATIENT_CLINIC_OR_DEPARTMENT_OTHER): Payer: Self-pay

## 2023-05-25 ENCOUNTER — Encounter: Payer: Self-pay | Admitting: Hematology & Oncology

## 2023-05-27 ENCOUNTER — Telehealth: Payer: BC Managed Care – PPO | Admitting: Emergency Medicine

## 2023-05-27 ENCOUNTER — Other Ambulatory Visit (HOSPITAL_BASED_OUTPATIENT_CLINIC_OR_DEPARTMENT_OTHER): Payer: Self-pay

## 2023-05-27 DIAGNOSIS — R1110 Vomiting, unspecified: Secondary | ICD-10-CM

## 2023-05-27 MED ORDER — ONDANSETRON HCL 4 MG PO TABS
4.0000 mg | ORAL_TABLET | Freq: Three times a day (TID) | ORAL | 0 refills | Status: DC | PRN
  Filled 2023-05-27: qty 18, 6d supply, fill #0

## 2023-05-27 NOTE — Progress Notes (Signed)
E-Visit for Vomiting  We are sorry that you are not feeling well. Here is how we plan to help!  Based on what you have shared with me it looks like you have a Virus that is irritating your GI tract.  Vomiting is the forceful emptying of a portion of the stomach's content through the mouth.  Although nausea and vomiting can make you feel miserable, it's important to remember that these are not diseases, but rather symptoms of an underlying illness.  When we treat short term symptoms, we always caution that any symptoms that persist should be fully evaluated in a medical office.  I have prescribed a medication that will help alleviate your symptoms and allow you to stay hydrated:  Zofran 4 mg 1 tablet every 8 hours as needed for nausea and vomiting  HOME CARE: Drink clear liquids.  This is very important! Dehydration (the lack of fluid) can lead to a serious complication.  Start off with 1 tablespoon every 5 minutes for 8 hours. You may begin eating bland foods after 8 hours without vomiting.  Start with saltine crackers, white bread, rice, mashed potatoes, applesauce. After 48 hours on a bland diet, you may resume a normal diet. Try to go to sleep.  Sleep often empties the stomach and relieves the need to vomit.  GET HELP RIGHT AWAY IF:  Your symptoms do not improve or worsen within 2 days after treatment. You have a fever for over 3 days. You cannot keep down fluids after trying the medication.  MAKE SURE YOU:  Understand these instructions. Will watch your condition. Will get help right away if you are not doing well or get worse.   Thank you for choosing an e-visit.  Your e-visit answers were reviewed by a board certified advanced clinical practitioner to complete your personal care plan. Depending upon the condition, your plan could have included both over the counter or prescription medications.  Please review your pharmacy choice. Make sure the pharmacy is open so you can pick  up prescription now. If there is a problem, you may contact your provider through [employer] and have the prescription routed to another pharmacy.  Your safety is important to [place]. If you have drug allergies check your prescription carefully.   For the next 24 hours you can use MyChart to ask questions about today's visit, request a non-urgent call back, or ask for a work or school excuse. You will get an email in the next two days asking about your experience. I hope that your e-visit has been valuable and will speed your recovery.   I have spent 5 minutes in review of e-visit questionnaire, review and updating patient chart, medical decision making and response to patient.   Willeen Cass, PhD, FNP-BC

## 2023-05-31 ENCOUNTER — Inpatient Hospital Stay: Payer: BC Managed Care – PPO | Admitting: Hematology & Oncology

## 2023-05-31 ENCOUNTER — Inpatient Hospital Stay: Payer: BC Managed Care – PPO | Attending: Hematology & Oncology

## 2023-05-31 ENCOUNTER — Encounter: Payer: Self-pay | Admitting: Hematology & Oncology

## 2023-06-02 ENCOUNTER — Encounter: Payer: Self-pay | Admitting: Physician Assistant

## 2023-06-02 DIAGNOSIS — R1110 Vomiting, unspecified: Secondary | ICD-10-CM

## 2023-06-02 DIAGNOSIS — R197 Diarrhea, unspecified: Secondary | ICD-10-CM

## 2023-06-02 DIAGNOSIS — R7982 Elevated C-reactive protein (CRP): Secondary | ICD-10-CM

## 2023-06-03 ENCOUNTER — Ambulatory Visit: Payer: BC Managed Care – PPO | Admitting: Physician Assistant

## 2023-06-09 ENCOUNTER — Other Ambulatory Visit (HOSPITAL_BASED_OUTPATIENT_CLINIC_OR_DEPARTMENT_OTHER): Payer: Self-pay

## 2023-06-13 ENCOUNTER — Other Ambulatory Visit (HOSPITAL_BASED_OUTPATIENT_CLINIC_OR_DEPARTMENT_OTHER): Payer: Self-pay

## 2023-06-13 MED ORDER — ACETAMINOPHEN-CODEINE 300-30 MG PO TABS
1.0000 | ORAL_TABLET | ORAL | 0 refills | Status: DC | PRN
Start: 2023-06-13 — End: 2023-06-30
  Filled 2023-06-13: qty 12, 2d supply, fill #0

## 2023-06-14 ENCOUNTER — Other Ambulatory Visit (HOSPITAL_BASED_OUTPATIENT_CLINIC_OR_DEPARTMENT_OTHER): Payer: Self-pay

## 2023-06-14 MED ORDER — METHYLPREDNISOLONE 4 MG PO TBPK
ORAL_TABLET | ORAL | 0 refills | Status: DC
  Filled 2023-06-14: qty 21, 6d supply, fill #0

## 2023-06-23 ENCOUNTER — Encounter: Payer: Self-pay | Admitting: Hematology & Oncology

## 2023-06-23 ENCOUNTER — Other Ambulatory Visit (HOSPITAL_BASED_OUTPATIENT_CLINIC_OR_DEPARTMENT_OTHER): Payer: Self-pay

## 2023-06-30 ENCOUNTER — Encounter: Payer: Self-pay | Admitting: Family Medicine

## 2023-06-30 ENCOUNTER — Other Ambulatory Visit (HOSPITAL_BASED_OUTPATIENT_CLINIC_OR_DEPARTMENT_OTHER): Payer: Self-pay

## 2023-06-30 ENCOUNTER — Ambulatory Visit: Payer: BC Managed Care – PPO | Admitting: Family Medicine

## 2023-06-30 VITALS — BP 134/84 | HR 106 | Ht 67.0 in | Wt 356.0 lb

## 2023-06-30 DIAGNOSIS — S39012D Strain of muscle, fascia and tendon of lower back, subsequent encounter: Secondary | ICD-10-CM

## 2023-06-30 MED ORDER — HYDROCODONE-ACETAMINOPHEN 5-325 MG PO TABS
1.0000 | ORAL_TABLET | Freq: Four times a day (QID) | ORAL | 0 refills | Status: DC | PRN
  Filled 2023-06-30: qty 10, 3d supply, fill #0

## 2023-06-30 MED ORDER — PREDNISONE 50 MG PO TABS
50.0000 mg | ORAL_TABLET | Freq: Every day | ORAL | 0 refills | Status: DC
  Filled 2023-06-30: qty 5, 5d supply, fill #0

## 2023-06-30 NOTE — Assessment & Plan Note (Signed)
Treating with 5 day burst of prednisone at 50mg  daily.  Lo w strength norco as needed for severe pain.  Red flags and precautions discussed.

## 2023-06-30 NOTE — Progress Notes (Signed)
Emma Boyer - 27 y.o. female MRN [id number]  Date of birth: 03/04/1996  Subjective Chief Complaint  Patient presents with   Back Pain    HPI Emma Boyer is a 27 y.o. female here today with complaint of back pain.  Pain located in the mid lower back.  She has history of lumbar DDD with mild scoliosis.  Spina bifida occulta noted on previous xrays as well. Seen in July for same issue and prescribed prednisone which seemed to help quite a bit.  She does not have radiation into the legs.  Denies bowel or bladder dysfunction.    ROS:  A comprehensive ROS was completed and negative except as noted per HPI  Allergies  Allergen Reactions   Peanut-Containing Drug Products Swelling and Other (See Comments)    Childhood reaction- lips became swollen/breathing not affected, though    Past Medical History:  Diagnosis Date   ADHD    Anxiety    Benign paroxysmal positional vertigo    Bulging lumbar disc 08/16/2012   Dry eye syndrome    HA (headache)    History of anal fissures    Low back pain    Neck pain    Pneumonia    Pseudopapilledema    Shoulder pain    Vision abnormalities    wears contacts, right eye worse    Past Surgical History:  Procedure Laterality Date   BIOPSY  11/04/2022   Procedure: BIOPSY;  Surgeon: Tressia Danas, MD;  Location: [facility];  Service: Gastroenterology;;   COLONOSCOPY WITH PROPOFOL N/A 11/04/2022   Procedure: COLONOSCOPY WITH PROPOFOL;  Surgeon: Tressia Danas, MD;  Location: [facility];  Service: Gastroenterology;  Laterality: N/A;  w biopsies   ESOPHAGOGASTRODUODENOSCOPY (EGD) WITH PROPOFOL N/A 11/04/2022   Procedure: ESOPHAGOGASTRODUODENOSCOPY (EGD) WITH PROPOFOL;  Surgeon: Tressia Danas, MD;  Location: [facility];  Service: Gastroenterology;  Laterality: N/A;  w biopsies   SUBOCCIPITAL CRANIECTOMY CERVICAL LAMINECTOMY N/A 05/01/2013   Procedure: Chiari Decompression;  Surgeon: Maeola Harman, MD;  Location: [facility];   Service: Neurosurgery;  Laterality: N/A;  Chiari Decompression    Social History   Socioeconomic History   Marital status: Single    Spouse name: Not on file   Number of children: 0   Years of education: Not on file   Highest education level: Some college, no degree  Occupational History    Comment: Lawyer   Occupation: Lobbyist  Tobacco Use   Smoking status: Former    Current packs/day: 0.00    Average packs/day: 0.1 packs/day for 7.0 years (0.7 ttl pk-yrs)    Types: Cigarettes    Start date: 03/18/2010    Quit date: 03/18/2017    Years since quitting: 6.2   Smokeless tobacco: Never  Vaping Use   Vaping status: Former   Substances: Nicotine  Substance and Sexual Activity   Alcohol use: Yes    Alcohol/week: 2.0 standard drinks of alcohol    Types: 1 Glasses of wine, 1 Cans of beer per week    Comment: once a week   Drug use: No   Sexual activity: Yes    Partners: Male  Other Topics Concern   Not on file  Social History Narrative   Patient lives at home with parents . 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    Right handed.   Caffeine- One cup of coffee daily and one soda.    Social Determinants of Health  Financial Resource Strain: Low Risk  (12/20/2022)   Received from [facility], [facility]   Overall Financial Resource Strain (CARDIA)    Difficulty of Paying Living Expenses: Not hard at all  Food Insecurity: No Food Insecurity (02/09/2023)   Hunger Vital Sign    Worried About Running Out of Food in the Last Year: Never true    Ran Out of Food in the Last Year: Never true  Transportation Needs: No Transportation Needs (12/20/2022)   Received from [facility], [facility]   PRAPARE - Transportation    Lack of Transportation (Medical): No    Lack of Transportation (Non-Medical): No  Physical Activity: Insufficiently Active (12/20/2022)   Received from [facility], [facility]   Exercise Vital Sign    Days of Exercise  per Week: 1 day    Minutes of Exercise per Session: 30 min  Stress: Not on file (06/24/2023)  Social Connections: Socially Integrated (12/20/2022)   Received from [facility], [facility]   Social Network    How would you rate your social network (family, work, friends)?: Good participation with social networks  Recent Concern: Social Connections - Moderately Isolated (12/05/2022)   Social Connection and Isolation Panel [NHANES]    Frequency of Communication with Friends and Family: Twice a week    Frequency of Social Gatherings with Friends and Family: Once a week    Attends Religious Services: Never    Database administrator or Organizations: No    Attends Engineer, structural: Not on file    Marital Status: Living with partner    Family History  Problem Relation Age of Onset   Anxiety disorder Mother    Depression Mother    High blood pressure Father    Arthritis Father    Anxiety disorder Sister    Depression Sister    Physical abuse Sister    Sexual abuse Sister    Anxiety disorder Maternal Grandfather    Dementia Maternal Grandfather    Depression Maternal Grandfather    ADD / ADHD Maternal Aunt    Anxiety disorder Maternal Aunt    Depression Maternal Aunt    Colon cancer Neg Hx    Stomach cancer Neg Hx    Esophageal cancer Neg Hx     Health Maintenance  Topic Date Due   COVID-19 Vaccine (3 - Pfizer risk series) 06/09/2020   INFLUENZA VACCINE  11/14/2023 (Originally 03/17/2023)   Hepatitis C Screening  05/10/2024 (Originally 08/18/2013)   HIV Screening  05/10/2024 (Originally 08/18/2010)   Cervical Cancer Screening (Pap smear)  09/17/2023   DTaP/Tdap/Td (6 - Td or Tdap) 02/06/2029   HPV VACCINES  Completed     ----------------------------------------------------------------------------------------------------------------------------------------------------------------------------------------------------------------- Physical Exam BP 134/84 (BP Location:  Left Arm, Patient Position: Sitting, Cuff Size: Large)   Pulse (!) 106   Ht 5\' 7"  (1.702 m)   Wt (!) 356 lb (161.5 kg)   SpO2 97%   BMI 55.76 kg/m   Physical Exam Constitutional:      Appearance: Normal appearance.  Musculoskeletal:     Comments: ROM of lumbar spine limited with flexion/extension.  SLR with pain in low back b/l  Neurological:     Mental Status: She is alert.     ------------------------------------------------------------------------------------------------------------------------------------------------------------------------------------------------------------------- Assessment and Plan  Strain of lumbar region Treating with 5 day burst of prednisone at 50mg  daily.  Lo w strength norco as needed for severe pain.  Red flags and precautions discussed.    Meds ordered this encounter  Medications   predniSONE (DELTASONE) 50 MG tablet    Sig: Take 1 tablet (50 mg total) by mouth daily for 5 days.    Dispense:  5 tablet    Refill:  0   HYDROcodone-acetaminophen (NORCO) 5-325 MG tablet    Sig: Take 1 tablet by mouth every 6 (six) hours as needed for moderate pain (pain score 4-6).    Dispense:  10 tablet    Refill:  0    No follow-ups on file.    This visit occurred during the SARS-CoV-2 public health emergency.  Safety protocols were in place, including screening questions prior to the visit, additional usage of staff PPE, and extensive cleaning of exam room while observing appropriate contact time as indicated for disinfecting solutions.

## 2023-07-10 ENCOUNTER — Ambulatory Visit (HOSPITAL_BASED_OUTPATIENT_CLINIC_OR_DEPARTMENT_OTHER)
Admission: RE | Admit: 2023-07-10 | Discharge: 2023-07-10 | Disposition: A | Discharge status: Home / Self Care | Payer: BC Managed Care – PPO | Source: Ambulatory Visit | Attending: Physician Assistant | Admitting: Physician Assistant

## 2023-07-10 DIAGNOSIS — M5117 Intervertebral disc disorders with radiculopathy, lumbosacral region: Secondary | ICD-10-CM | POA: Diagnosis not present

## 2023-07-10 DIAGNOSIS — M51369 Other intervertebral disc degeneration, lumbar region without mention of lumbar back pain or lower extremity pain: Secondary | ICD-10-CM | POA: Diagnosis not present

## 2023-07-10 DIAGNOSIS — M5116 Intervertebral disc disorders with radiculopathy, lumbar region: Secondary | ICD-10-CM | POA: Diagnosis not present

## 2023-07-10 DIAGNOSIS — M5441 Lumbago with sciatica, right side: Secondary | ICD-10-CM | POA: Insufficient documentation

## 2023-07-10 DIAGNOSIS — G8929 Other chronic pain: Secondary | ICD-10-CM

## 2023-07-11 ENCOUNTER — Other Ambulatory Visit: Payer: Self-pay | Admitting: Gastroenterology

## 2023-07-11 ENCOUNTER — Other Ambulatory Visit (HOSPITAL_COMMUNITY): Payer: Self-pay | Admitting: Gastroenterology

## 2023-07-11 ENCOUNTER — Other Ambulatory Visit (HOSPITAL_BASED_OUTPATIENT_CLINIC_OR_DEPARTMENT_OTHER): Payer: Self-pay

## 2023-07-11 DIAGNOSIS — R112 Nausea with vomiting, unspecified: Secondary | ICD-10-CM

## 2023-07-22 ENCOUNTER — Other Ambulatory Visit (HOSPITAL_BASED_OUTPATIENT_CLINIC_OR_DEPARTMENT_OTHER): Payer: Self-pay

## 2023-07-22 ENCOUNTER — Encounter: Payer: Self-pay | Admitting: Hematology & Oncology

## 2023-07-26 ENCOUNTER — Encounter (HOSPITAL_COMMUNITY): Payer: Self-pay

## 2023-07-26 ENCOUNTER — Ambulatory Visit (HOSPITAL_COMMUNITY): Payer: BC Managed Care – PPO

## 2023-07-26 ENCOUNTER — Encounter: Payer: Self-pay | Admitting: Physician Assistant

## 2023-07-26 NOTE — Progress Notes (Signed)
MRI shows degenerative disc disease at L4/L5/S1. Some small disc protrusion but no significant signs to indicate compression. This pain is chronic and more disogenic. I would suggest [place] targeting weight loss, increasing core strength, and considering a medication for chronic pain like cymbalta. We could also refer you to a orthopedic office for you to discuss plan with them? Thoughts?

## 2023-07-29 ENCOUNTER — Encounter: Payer: Self-pay | Admitting: Physician Assistant

## 2023-07-30 ENCOUNTER — Telehealth: Payer: BC Managed Care – PPO | Admitting: Physician Assistant

## 2023-07-30 DIAGNOSIS — J029 Acute pharyngitis, unspecified: Secondary | ICD-10-CM | POA: Diagnosis not present

## 2023-07-30 NOTE — Progress Notes (Signed)
 E-Visit for Sore Throat  We are sorry that you are not feeling well.  Here is how we plan to help!  Your symptoms indicate a likely viral infection (Pharyngitis).   Pharyngitis is inflammation in the back of the throat which can cause a sore throat, scratchiness and sometimes difficulty swallowing.   Pharyngitis is typically caused by a respiratory virus and will just run its course.  Please keep in mind that your symptoms could last up to 10 days.  For throat pain, we recommend over the counter oral pain relief medications such as acetaminophen or aspirin, or anti-inflammatory medications such as ibuprofen or naproxen sodium.  Topical treatments such as oral throat lozenges or sprays may be used as needed.  Avoid close contact with loved ones, especially the very young and elderly.  Remember to wash your hands thoroughly throughout the day as this is the number one way to prevent the spread of infection and wipe down door knobs and counters with disinfectant.  After careful review of your answers, I would not recommend an antibiotic for your condition.  Antibiotics should not be used to treat conditions that we suspect are caused by viruses like the virus that causes the common cold or flu. However, some people can have Strep with atypical symptoms. You may need formal testing in clinic or office to confirm if your symptoms continue or worsen.  Providers prescribe antibiotics to treat infections caused by bacteria. Antibiotics are very powerful in treating bacterial infections when they are used properly.  To maintain their effectiveness, they should be used only when necessary.  Overuse of antibiotics has resulted in the development of super bugs that are resistant to treatment!    Home Care: Only take medications as instructed by your medical team. Do not drink alcohol while taking these medications. A steam or ultrasonic humidifier can help congestion.  You can place a towel over your head and  breathe in the steam from hot water coming from a faucet. Avoid close contacts especially the very young and the elderly. Cover your mouth when you cough or sneeze. Always remember to wash your hands.  Get Help Right Away If: You develop worsening fever or throat pain. You develop a severe head ache or visual changes. Your symptoms persist after you have completed your treatment plan.  Make sure you Understand these instructions. Will watch your condition. Will get help right away if you are not doing well or get worse.   Thank you for choosing an e-visit.  Your e-visit answers were reviewed by a board certified advanced clinical practitioner to complete your personal care plan. Depending upon the condition, your plan could have included both over the counter or prescription medications.  Please review your pharmacy choice. Make sure the pharmacy is open so you can pick up prescription now. If there is a problem, you may contact your provider through [employer] and have the prescription routed to another pharmacy.  Your safety is important to [place]. If you have drug allergies check your prescription carefully.   For the next 24 hours you can use MyChart to ask questions about today's visit, request a non-urgent call back, or ask for a work or school excuse. You will get an email in the next two days asking about your experience. I hope that your e-visit has been valuable and will speed your recovery.   I have spent 5 minutes in review of e-visit questionnaire, review and updating patient chart, medical decision making and  response to patient.   Tylene Fantasia Ward, PA-C

## 2023-08-19 ENCOUNTER — Other Ambulatory Visit (HOSPITAL_BASED_OUTPATIENT_CLINIC_OR_DEPARTMENT_OTHER): Payer: Self-pay

## 2023-08-19 ENCOUNTER — Encounter: Payer: Self-pay | Admitting: Hematology & Oncology

## 2023-08-19 ENCOUNTER — Ambulatory Visit (INDEPENDENT_AMBULATORY_CARE_PROVIDER_SITE_OTHER): Payer: Self-pay | Admitting: Physician Assistant

## 2023-08-19 VITALS — BP 150/91 | HR 99 | Ht 67.0 in | Wt 361.0 lb

## 2023-08-19 DIAGNOSIS — M5441 Lumbago with sciatica, right side: Secondary | ICD-10-CM | POA: Diagnosis not present

## 2023-08-19 DIAGNOSIS — M51362 Other intervertebral disc degeneration, lumbar region with discogenic back pain and lower extremity pain: Secondary | ICD-10-CM

## 2023-08-19 DIAGNOSIS — F419 Anxiety disorder, unspecified: Secondary | ICD-10-CM

## 2023-08-19 DIAGNOSIS — F902 Attention-deficit hyperactivity disorder, combined type: Secondary | ICD-10-CM | POA: Diagnosis not present

## 2023-08-19 DIAGNOSIS — G8929 Other chronic pain: Secondary | ICD-10-CM

## 2023-08-19 MED ORDER — DULOXETINE HCL 20 MG PO CPEP
20.0000 mg | ORAL_CAPSULE | Freq: Every day | ORAL | 0 refills | Status: DC
  Filled 2023-08-19: qty 30, 30d supply, fill #0

## 2023-08-19 MED ORDER — AMPHETAMINE-DEXTROAMPHET ER 20 MG PO CP24
20.0000 mg | ORAL_CAPSULE | ORAL | 0 refills | Status: DC
  Filled 2023-08-19: qty 30, 30d supply, fill #0

## 2023-08-19 NOTE — Progress Notes (Signed)
 Established Patient Office Visit  Subjective   Patient ID: Emma Boyer, female    DOB: [birth year]  Age: 28 y.o. MRN: [id number]  Chief Complaint  Patient presents with   Medical Management of Chronic Issues    Medication management      HPI Pt is a 28 yo female who presents to the clinic to discussed chronic low back pain and cymbalta .   Pt has not had another chronic back pain flare since November. She is worried about being on cymbalta  and zoloft .   Ritalin  is just not working like it was. Would like to consider different ADD medication. Not tried anything else.      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   Pseudopapilledema    Shoulder pain    Acute right-sided low back pain without sciatica 04/20/2013   Abnormal weight gain 01/27/2015   Migraine headache 01/27/2015   History of migraine headaches 08/29/2015   De Quervain's tenosynovitis, bilateral 11/21/2015   Hemoptysis 09/07/2016   Temporomandibular joint arthralgia, right 04/25/2017   H/O degenerative disc disease 02/07/2019   Acute suppurative inflammation of sphenoidal sinus 10/13/2020   Empty sella turcica (HCC) 10/13/2020   Benign paroxysmal positional vertigo, right 10/13/2020   Insomnia 10/13/2020   Anxiety 10/13/2020   Vomiting 04/15/2021   Class 3 severe obesity due to excess calories without serious comorbidity with body mass index (BMI) of 50.0 to 59.9 in adult (HCC) 04/15/2021   Gastroesophageal reflux disease 04/15/2021   Strain of lumbar region 11/19/2021   Attention deficit hyperactivity disorder (ADHD), combined type 11/22/2021   Elevated blood pressure reading in office without diagnosis of hypertension 10/20/2022   Diarrhea 11/04/2022   Abdominal pain 11/04/2022   Rectal bleeding 11/04/2022   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   Bloating 12/06/2022   ESR raised 12/06/2022    Elevated C-reactive protein (CRP) 12/06/2022   Leukocytosis 02/01/2023   Abnormal red blood cells 02/01/2023   Abnormal CBC 02/01/2023   Anemia, iron  deficiency 02/22/2023   Chronic bilateral low back pain with right-sided sciatica 03/16/2023   DDD (degenerative disc disease), lumbar 03/16/2023   Resolved Ambulatory Problems    Diagnosis Date Noted   OTITIS MEDIA, SEROUS, ACUTE, LEFT 05/18/2009   Acute pharyngitis 04/28/2010   Acute upper respiratory infection 05/18/2009   Preventive measure 09/08/2012   Cerumen impaction 01/14/2014   Influenza A 09/23/2015   Viral syndrome 10/06/2018   Past Medical History:  Diagnosis Date   ADHD    Benign paroxysmal positional vertigo    Bulging lumbar disc 08/16/2012   Dry eye syndrome    HA (headache)    History of anal fissures    Low back pain    Neck pain    Pneumonia    Vision abnormalities       ROS   See HPI.  Objective:     BP (!) 150/91   Pulse 99   Ht 5' 7 (1.702 m)   Wt (!) 361 lb (163.7 kg)   SpO2 99%   BMI 56.54 kg/m  BP Readings from Last 3 Encounters:  08/19/23 (!) 150/91  06/30/23 134/84  05/11/23 128/88   Wt Readings from Last 3 Encounters:  08/19/23 (!) 361 lb (163.7 kg)  06/30/23 (!) 356 lb (161.5 kg)  05/11/23 (!) 360 lb (163.3 kg)      Physical Exam Constitutional:  Appearance: She is obese.  HENT:     Head: Normocephalic.  Neurological:     General: No focal deficit present.     Mental Status: She is alert.  Psychiatric:        Mood and Affect: Mood normal.          Assessment & Plan:  [name] was seen today for medical management of chronic issues.  Diagnoses and all orders for this visit:  Chronic bilateral low back pain with right-sided sciatica -     DULoxetine  (CYMBALTA ) 20 MG capsule; Take 1 capsule (20 mg total) by mouth daily.  Attention deficit hyperactivity disorder (ADHD), combined type -     amphetamine -dextroamphetamine  (ADDERALL XR) 20 MG 24 hr capsule;  Take 1 capsule (20 mg total) by mouth every morning.  Degeneration of intervertebral disc of lumbar region with discogenic back pain and lower extremity pain -     DULoxetine  (CYMBALTA ) 20 MG capsule; Take 1 capsule (20 mg total) by mouth daily.   Stop ritalin  and start adderall.  Discussed cymbalta  for chronic pain Decrease zoloft  to 150mg  daily Discussed serotonin syndrome signs and symptoms Given exercises for low back pain Discussed good lifting techniques Follow up in 1 month  Return in about 4 weeks (around 09/16/2023).      Cherrie Franca, PA-C

## 2023-08-19 NOTE — Patient Instructions (Signed)
 Stop ritalin  and start adderall Cut zoloft  back to 150mg  daily Add cymbalta  20mg  daily  Serotonin Syndrome Serotonin is a chemical that helps to control several functions in the body. This chemical is also called a neurotransmitter. It controls: Brain and nerve cell function. Mood and emotions. Memory. Eating. Sleeping. Sexual activity. Stress response. Having too much serotonin in your body can cause serotonin syndrome. This condition can be harmful to your brain and nerve cells. This can be a life-threatening condition. What are the causes? This condition may be caused by taking medicines or drugs that increase the level of serotonin in your body, such as: Antidepressant medicines. Migraine medicines. Certain pain medicines. Certain drugs, including ecstasy, LSD, cocaine, and amphetamines. Over-the-counter cough or cold medicines that contain dextromethorphan. Certain herbal supplements, including St. John's wort, ginseng, and nutmeg. This condition usually occurs when you take these medicines or drugs together, but it can also happen with a high dose of a single medicine or drug. What increases the risk? You are more likely to develop this condition if: You just started taking a medicine or drug that increases the level of serotonin in the body. You recently increased the dose of a medicine or drug that increases the level of serotonin in the body. You take more than one medicine or drug that increases the level of serotonin in the body. What are the signs or symptoms? Symptoms of this condition usually start within several hours of taking a medicine or drug. Symptoms may be mild or severe. Mild symptoms include: Sweating. Restlessness or agitation. Muscle twitching or stiffness. Rapid heart rate. Nausea, vomiting, or diarrhea. Shivering or goose bumps. Confusion. Severe symptoms include: Irregular heartbeat. Seizures. Loss of consciousness. High fever. How is this  diagnosed? This condition may be diagnosed based on: Your medical history. A physical exam. Your prior use of drugs and medicines. Blood or urine tests. These may be used to rule out other causes of your symptoms. How is this treated? The treatment for this condition depends on the severity of your symptoms. For mild cases, stopping the medicine or drug that caused your condition is usually all that is needed. For moderate to severe cases, treatment in a hospital may be needed to prevent or treat life-threatening symptoms. Treatment may include: Medicines to control your symptoms. IV fluids. Actions to support your breathing. Treatments to control your body temperature. Follow these instructions at home: Medicines  Take over-the-counter and prescription medicines only as told by your health care provider. Check with your health care provider before you start taking any new prescriptions, over-the-counter medicines, herbs, or supplements. Do not combine any medicines that can cause this condition. Lifestyle  Maintain a healthy lifestyle. Eat a healthy diet that includes plenty of vegetables, fruits, whole grains, low-fat dairy products, and lean protein. Do not eat a lot of foods that are high in fat, added sugars, or salt. Get the right amount and quality of sleep. Most adults need 7-9 hours of sleep each night. Make time to exercise, even if it is only for short periods of time. Most adults should exercise for at least 150 minutes each week. Do not drink alcohol. Do not use illegal drugs. Do not take medicines for reasons other than they are prescribed. General instructions Do not use any products that contain nicotine or tobacco. These products include cigarettes, chewing tobacco, and vaping devices, such as e-cigarettes. If you need help quitting, ask your health care provider. Contact a health care provider if: Your  symptoms do not improve or they get worse. Get help right away  if: You have worsening confusion, severe headache, chest pain, high fever, seizures, or loss of consciousness. You experience serious side effects of medicine, such as swelling of your face, lips, tongue, or throat. These symptoms may be an emergency. Get help right away. Call 911. Do not wait to see if the symptoms will go away. Do not drive yourself to the hospital. Also, get help right away if: You have serious thoughts about hurting yourself or others. Take one of these steps if you feel like you may hurt yourself or others, or have thoughts about taking your own life: Go to your nearest emergency room. Call 911. Call the National Suicide Prevention Lifeline at 414 571 5492 or 988. This is open 24 hours a day. Text the Crisis Text Line at 512 234 1702. Summary Serotonin is a chemical that helps to control several functions in the body. High levels of serotonin in the body can cause serotonin syndrome, which can be life-threatening. This condition may be caused by taking medicines or drugs that increase the level of serotonin in your body. Treatment depends on the severity of your symptoms. For mild cases, stopping the medicine or drug that caused your condition is usually all that is needed. Check with your health care provider before you start taking any new prescriptions, over-the-counter medicines, herbs, or supplements. This information is not intended to replace advice given to you by your health care provider. Make sure you discuss any questions you have with your health care provider. Document Revised: 10/22/2021 Document Reviewed: 10/22/2021 Elsevier Patient Education  2024 [employer].

## 2023-08-22 ENCOUNTER — Encounter (HOSPITAL_BASED_OUTPATIENT_CLINIC_OR_DEPARTMENT_OTHER): Payer: Self-pay | Admitting: Pharmacist

## 2023-08-22 ENCOUNTER — Encounter: Payer: Self-pay | Admitting: Physician Assistant

## 2023-08-22 ENCOUNTER — Other Ambulatory Visit (HOSPITAL_BASED_OUTPATIENT_CLINIC_OR_DEPARTMENT_OTHER): Payer: Self-pay

## 2023-08-22 ENCOUNTER — Encounter: Payer: Self-pay | Admitting: Hematology & Oncology

## 2023-08-22 MED ORDER — SERTRALINE HCL 100 MG PO TABS
200.0000 mg | ORAL_TABLET | Freq: Every morning | ORAL | 1 refills | Status: DC
  Filled 2023-08-22: qty 180, 90d supply, fill #0

## 2023-08-23 ENCOUNTER — Other Ambulatory Visit (HOSPITAL_BASED_OUTPATIENT_CLINIC_OR_DEPARTMENT_OTHER): Payer: Self-pay

## 2023-08-29 ENCOUNTER — Encounter (HOSPITAL_COMMUNITY): Payer: Self-pay

## 2023-09-09 ENCOUNTER — Encounter: Payer: Self-pay | Admitting: Hematology & Oncology

## 2023-09-12 ENCOUNTER — Other Ambulatory Visit (HOSPITAL_BASED_OUTPATIENT_CLINIC_OR_DEPARTMENT_OTHER): Payer: Self-pay

## 2023-09-12 ENCOUNTER — Telehealth: Payer: BC Managed Care – PPO | Admitting: Emergency Medicine

## 2023-09-12 ENCOUNTER — Encounter: Payer: Self-pay | Admitting: Emergency Medicine

## 2023-09-12 ENCOUNTER — Encounter: Payer: Self-pay | Admitting: Hematology & Oncology

## 2023-09-12 ENCOUNTER — Ambulatory Visit
Admission: EM | Admit: 2023-09-12 | Discharge: 2023-09-12 | Disposition: A | Discharge status: Home / Self Care | Payer: BC Managed Care – PPO | Attending: Internal Medicine | Admitting: Internal Medicine

## 2023-09-12 DIAGNOSIS — J40 Bronchitis, not specified as acute or chronic: Secondary | ICD-10-CM

## 2023-09-12 DIAGNOSIS — J069 Acute upper respiratory infection, unspecified: Secondary | ICD-10-CM

## 2023-09-12 DIAGNOSIS — J111 Influenza due to unidentified influenza virus with other respiratory manifestations: Secondary | ICD-10-CM

## 2023-09-12 LAB — POC COVID19/FLU A&B COMBO
Covid Antigen, POC: NEGATIVE
Influenza A Antigen, POC: NEGATIVE
Influenza B Antigen, POC: NEGATIVE

## 2023-09-12 MED ORDER — ALBUTEROL SULFATE HFA 108 (90 BASE) MCG/ACT IN AERS
2.0000 | INHALATION_SPRAY | Freq: Four times a day (QID) | RESPIRATORY_TRACT | 2 refills | Status: DC | PRN
  Filled 2023-09-12: qty 6.7, 25d supply, fill #0

## 2023-09-12 MED ORDER — PROMETHAZINE-DM 6.25-15 MG/5ML PO SYRP
5.0000 mL | ORAL_SOLUTION | Freq: Three times a day (TID) | ORAL | 0 refills | Status: DC | PRN
  Filled 2023-09-12: qty 180, 12d supply, fill #0

## 2023-09-12 MED ORDER — ONDANSETRON 4 MG PO TBDP
4.0000 mg | ORAL_TABLET | Freq: Once | ORAL | Status: AC
  Administered 2023-09-12: 4 mg via ORAL

## 2023-09-12 MED ORDER — PREDNISONE 20 MG PO TABS
40.0000 mg | ORAL_TABLET | Freq: Every day | ORAL | 0 refills | Status: AC
  Filled 2023-09-12: qty 10, 5d supply, fill #0

## 2023-09-12 MED ORDER — ACETAMINOPHEN 325 MG PO TABS
650.0000 mg | ORAL_TABLET | Freq: Once | ORAL | Status: AC
  Administered 2023-09-12: 650 mg via ORAL

## 2023-09-12 NOTE — Discharge Instructions (Addendum)
Flu A and Flu B and Covid are all negative but symptoms still consistent with a viral upper respiratory infection. This does not require antibiotics. We will treat the symptoms with the following:  Prednisone 40 mg daily for 5 days. Take this in the morning. This is a steroid to help with inflammation. Promethazine DM 5 mL every 8 hours as needed for cough.  Use caution as this medication can cause drowsiness. Tylenol as needed for fevers.  Avoid taking ibuprofen while on prednisone. Rest and stay hydrated Use albuterol inhaler as needed for shortness of breath Since it is very early on with your symptoms, you may want to considering retesting tomorrow for Flu and Covid and if positive at that time, could do a telehealth urgent care visit to see if Tamiflu or Paxlovid is an option (although you would need blood work for your kidney function for Paxlovid). Return to urgent care or PCP if symptoms worsen or fail to resolve.

## 2023-09-12 NOTE — Progress Notes (Signed)
  Because you are experiencing shortness of breath and chest or abdominal pain with your flu-like symptoms, I feel your condition warrants further evaluation and I recommend that you be seen in a face-to-face visit. Pneumonia is a possible consequence of the flu.    NOTE: There will be NO CHARGE for this E-Visit   If you are having a true medical emergency, please call 911.     For an urgent face to face visit, [place] has multiple urgent care centers for your convenience.  Click the link below for the full list of locations and hours, walk-in wait times, appointment scheduling options and driving directions:  Urgent Care - [place], [place], [place], [place], [place], [place]       Your MyChart E-visit questionnaire answers were reviewed by a board certified advanced clinical practitioner to complete your personal care plan based on your specific symptoms.    Thank you for using e-Visits.

## 2023-09-12 NOTE — ED Triage Notes (Signed)
Patient c/o body aches, nausea and headache since waking up this morning.  Patient has taken Tylenol.

## 2023-09-12 NOTE — ED Provider Notes (Signed)
[facility]    CSN: [id number] Arrival date & time: 09/12/23  1133      History   Chief Complaint Chief Complaint  Patient presents with   Generalized Body Aches    HPI Emma Boyer is a 28 y.o. female.   28 year old female who presents urgent care with complaints of bodyaches, cough, nausea and headache.  Her symptoms started through the night last night and were mild at first.  The body aches and headache became much more severe this morning along with a persistent cough.  She does have asthma and uses an inhaler.  She is also having fevers and chills.  She has taken Tylenol today for the fevers.  She reports that her symptoms continued to get worse throughout the day today.     Past Medical History:  Diagnosis Date   ADHD    Anxiety    Benign paroxysmal positional vertigo    Bulging lumbar disc 08/16/2012   Dry eye syndrome    HA (headache)    History of anal fissures    Low back pain    Neck pain    Pneumonia    Pseudopapilledema    Shoulder pain    Vision abnormalities    wears contacts, right eye worse    Patient Active Problem List   Diagnosis Date Noted   Chronic bilateral low back pain with right-sided sciatica 03/16/2023   DDD (degenerative disc disease), lumbar 03/16/2023   Anemia, iron deficiency 02/22/2023   Leukocytosis 02/01/2023   Abnormal red blood cells 02/01/2023   Abnormal CBC 02/01/2023   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   Bloating 12/06/2022   ESR raised 12/06/2022   Elevated C-reactive protein (CRP) 12/06/2022   Diarrhea 11/04/2022   Abdominal pain 11/04/2022   Rectal bleeding 11/04/2022   Elevated blood pressure reading in office without diagnosis of hypertension 10/20/2022   Attention deficit hyperactivity disorder (ADHD), combined type 11/22/2021   Strain of lumbar region 11/19/2021   Vomiting 04/15/2021   Class 3 severe obesity due to excess calories without serious comorbidity with body mass  index (BMI) of 50.0 to 59.9 in adult (HCC) 04/15/2021   Gastroesophageal reflux disease 04/15/2021   Acute suppurative inflammation of sphenoidal sinus 10/13/2020   Empty sella turcica (HCC) 10/13/2020   Benign paroxysmal positional vertigo, right 10/13/2020   Insomnia 10/13/2020   Anxiety 10/13/2020   H/O degenerative disc disease 02/07/2019   Temporomandibular joint arthralgia, right 04/25/2017   Hemoptysis 09/07/2016   De Quervain's tenosynovitis, bilateral 11/21/2015   History of migraine headaches 08/29/2015   Abnormal weight gain 01/27/2015   Migraine headache 01/27/2015   Acute right-sided low back pain without sciatica 04/20/2013   Pseudopapilledema    Shoulder pain    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   Difficulty concentrating 09/08/2012    Past Surgical History:  Procedure Laterality Date   BIOPSY  11/04/2022   Procedure: BIOPSY;  Surgeon: Tressia Danas, MD;  Location: [facility];  Service: Gastroenterology;;   COLONOSCOPY WITH PROPOFOL N/A 11/04/2022   Procedure: COLONOSCOPY WITH PROPOFOL;  Surgeon: Tressia Danas, MD;  Location: [facility];  Service: Gastroenterology;  Laterality: N/A;  w biopsies   ESOPHAGOGASTRODUODENOSCOPY (EGD) WITH PROPOFOL N/A 11/04/2022   Procedure: ESOPHAGOGASTRODUODENOSCOPY (EGD) WITH PROPOFOL;  Surgeon: Tressia Danas, MD;  Location: [facility];  Service: Gastroenterology;  Laterality: N/A;  w biopsies   SUBOCCIPITAL CRANIECTOMY CERVICAL LAMINECTOMY N/A 05/01/2013   Procedure: Chiari Decompression;  Surgeon: Maeola Harman, MD;  Location:  [facility];  Service: Neurosurgery;  Laterality: N/A;  Chiari Decompression    OB History   No obstetric history on file.      Home Medications    Prior to Admission medications   Medication Sig Start Date End Date Taking? Authorizing Provider  albuterol (VENTOLIN HFA) 108 (90 Base) MCG/ACT inhaler Inhale 2 puffs into the lungs every 6 (six) hours as needed for wheezing or  shortness of breath. 08/20/22  Yes Breeback, Jade L, PA-C  amphetamine-dextroamphetamine (ADDERALL XR) 20 MG 24 hr capsule Take 1 capsule (20 mg total) by mouth every morning. 08/19/23  Yes Breeback, Jade L, PA-C  cyclobenzaprine (FLEXERIL) 10 MG tablet Take 1 tablet (10 mg total) by mouth 3 (three) times daily as needed. 05/11/23  Yes Breeback, Jade L, PA-C  DULoxetine (CYMBALTA) 20 MG capsule Take 1 capsule (20 mg total) by mouth daily. 08/19/23  Yes Breeback, Jade L, PA-C  metoCLOPramide (REGLAN) 10 MG tablet 1 tablet before meals Orally Twice a day As needed   Yes [provider]  omeprazole (PRILOSEC) 40 MG capsule Take 1 capsule (40 mg total) by mouth 2 (two) times daily. 10/05/22  Yes Doree Albee, PA-C  phentermine (ADIPEX-P) 37.5 MG tablet 1 capsule Orally once a day   Yes [provider]  sertraline (ZOLOFT) 100 MG tablet Take 2 tablets (200 mg total) by mouth in the morning. 08/22/23  Yes Breeback, Jade L, PA-C  traZODone (DESYREL) 50 MG tablet Take 2 tablets (100 mg total) by mouth at bedtime. 05/11/23  Yes Breeback, Jade L, PA-C  Liraglutide -Weight Management (SAXENDA) 18 MG/3ML SOPN 0.5 Subcutaneous Once a day    [provider]  methylphenidate (RITALIN) 20 MG tablet 1 tablet on an empty stomach Orally Once a day    [provider]  topiramate (TOPAMAX) 100 MG tablet 1 tablet Orally once a day    [provider]    Family History Family History  Problem Relation Age of Onset   Anxiety disorder Mother    Depression Mother    High blood pressure Father    Arthritis Father    Anxiety disorder Sister    Depression Sister    Physical abuse Sister    Sexual abuse Sister    Anxiety disorder Maternal Grandfather    Dementia Maternal Grandfather    Depression Maternal Grandfather    ADD / ADHD Maternal Aunt    Anxiety disorder Maternal Aunt    Depression Maternal Aunt    Colon cancer Neg Hx    Stomach cancer Neg Hx    Esophageal cancer  Neg Hx     Social History Social History   Tobacco Use   Smoking status: Former    Current packs/day: 0.00    Average packs/day: 0.1 packs/day for 7.0 years (0.7 ttl pk-yrs)    Types: Cigarettes    Start date: 03/18/2010    Quit date: 03/18/2017    Years since quitting: 6.4   Smokeless tobacco: Never  Vaping Use   Vaping status: Former   Substances: Nicotine  Substance Use Topics   Alcohol use: Yes    Alcohol/week: 2.0 standard drinks of alcohol    Types: 1 Glasses of wine, 1 Cans of beer per week    Comment: once a week   Drug use: No     Allergies   Peanut-containing drug products   Review of Systems Review of Systems  Constitutional:  Positive for fatigue and fever. Negative for chills.  HENT:  Negative for ear pain and sore throat.   Eyes:  Negative for pain and visual disturbance.  Respiratory:  Negative for cough and shortness of breath.   Cardiovascular:  Negative for chest pain and palpitations.  Gastrointestinal:  Positive for nausea. Negative for abdominal pain and vomiting.  Genitourinary:  Negative for dysuria and hematuria.  Musculoskeletal:  Negative for arthralgias and back pain.       Generalized body aches  Skin:  Negative for color change and rash.  Neurological:  Positive for headaches. Negative for seizures and syncope.  All other systems reviewed and are negative.    Physical Exam Triage Vital Signs ED Triage Vitals  Encounter Vitals Group     BP 09/12/23 1204 (!) 167/78     Systolic BP Percentile --      Diastolic BP Percentile --      Pulse Rate 09/12/23 1204 (!) 121     Resp 09/12/23 1204 18     Temp 09/12/23 1204 (!) 100.6 F (38.1 C)     Temp Source 09/12/23 1204 Oral     SpO2 09/12/23 1204 98 %     Weight 09/12/23 1206 (!) 350 lb (158.8 kg)     Height 09/12/23 1206 5\' 7"  (1.702 m)     Head Circumference --      Peak Flow --      Pain Score 09/12/23 1206 8     Pain Loc --      Pain Education --      Exclude from Growth Chart --     No data found.  Updated Vital Signs BP (!) 167/78 (BP Location: Left Arm)   Pulse (!) 121   Temp (!) 100.6 F (38.1 C) (Oral)   Resp 18   Ht 5\' 7"  (1.702 m)   Wt (!) 350 lb (158.8 kg)   LMP 08/22/2023   SpO2 98%   BMI 54.82 kg/m   Visual Acuity Right Eye Distance:   Left Eye Distance:   Bilateral Distance:    Right Eye Near:   Left Eye Near:    Bilateral Near:     Physical Exam Vitals and nursing note reviewed.  Constitutional:      General: She is not in acute distress.    Appearance: She is well-developed.     Comments: Appears to not feel well  HENT:     Head: Normocephalic and atraumatic.     Right Ear: Tympanic membrane normal.     Left Ear: Tympanic membrane normal.     Nose: Nose normal.     Mouth/Throat:     Mouth: Mucous membranes are moist.     Pharynx: Posterior oropharyngeal erythema (Mild) present.  Eyes:     Conjunctiva/sclera: Conjunctivae normal.  Cardiovascular:     Rate and Rhythm: Normal rate and regular rhythm.     Heart sounds: No murmur heard. Pulmonary:     Effort: Pulmonary effort is normal. No respiratory distress.     Breath sounds: Normal breath sounds. No decreased breath sounds, wheezing or rhonchi.  Abdominal:     Palpations: Abdomen is soft.     Tenderness: There is no abdominal tenderness.  Musculoskeletal:        General: No swelling.     Cervical back: Neck supple.  Skin:    General: Skin is warm and dry.     Capillary Refill: Capillary refill takes less than 2 seconds.  Neurological:     Mental Status: She is  alert.  Psychiatric:        Mood and Affect: Mood normal.      UC Treatments / Results  Labs (all labs ordered are listed, but only abnormal results are displayed) Labs Reviewed  POC COVID19/FLU A&B COMBO    EKG   Radiology No results found.  Procedures Procedures (including critical care time)  Medications Ordered in UC Medications  acetaminophen (TYLENOL) tablet 650 mg (650 mg Oral Given  09/12/23 1216)  ondansetron (ZOFRAN-ODT) disintegrating tablet 4 mg (4 mg Oral Given 09/12/23 1216)    Initial Impression / Assessment and Plan / UC Course  I have reviewed the triage vital signs and the nursing notes.  Pertinent labs & imaging results that were available during my care of the patient were reviewed by me and considered in my medical decision making (see chart for details).     Viral upper respiratory tract infection with cough  Bronchitis - Plan: albuterol (VENTOLIN HFA) 108 (90 Base) MCG/ACT inhaler   Flu A and Flu B and Covid are all negative but symptoms still consistent with a viral upper respiratory infection. This does not require antibiotics. We will treat the symptoms with the following:  Prednisone 40 mg daily for 5 days. Take this in the morning. This is a steroid to help with inflammation. Promethazine DM 5 mL every 8 hours as needed for cough.  Use caution as this medication can cause drowsiness. Tylenol as needed for fevers.  Avoid taking ibuprofen while on prednisone. Rest and stay hydrated Use albuterol inhaler as needed for shortness of breath Since it is very early on with your symptoms, you may want to considering retesting tomorrow for Flu and Covid and if positive at that time, could do a telehealth urgent care visit to see if Tamiflu or Paxlovid is an option (although you would need blood work for your kidney function for Paxlovid). Return to urgent care or PCP if symptoms worsen or fail to resolve.    Final Clinical Impressions(s) / UC Diagnoses   Final diagnoses:  None   Discharge Instructions   None    ED Prescriptions   None    PDMP not reviewed this encounter.   Landis Martins, [place] 09/12/23 1241

## 2023-09-13 ENCOUNTER — Emergency Department (HOSPITAL_BASED_OUTPATIENT_CLINIC_OR_DEPARTMENT_OTHER)
Admission: EM | Admit: 2023-09-13 | Discharge: 2023-09-13 | Disposition: A | Discharge status: Home / Self Care | Payer: BC Managed Care – PPO | Attending: Emergency Medicine | Admitting: Emergency Medicine

## 2023-09-13 ENCOUNTER — Other Ambulatory Visit: Payer: Self-pay

## 2023-09-13 ENCOUNTER — Encounter (HOSPITAL_BASED_OUTPATIENT_CLINIC_OR_DEPARTMENT_OTHER): Payer: Self-pay | Admitting: Emergency Medicine

## 2023-09-13 ENCOUNTER — Encounter: Payer: Self-pay | Admitting: Hematology & Oncology

## 2023-09-13 ENCOUNTER — Other Ambulatory Visit (HOSPITAL_BASED_OUTPATIENT_CLINIC_OR_DEPARTMENT_OTHER): Payer: Self-pay

## 2023-09-13 DIAGNOSIS — J101 Influenza due to other identified influenza virus with other respiratory manifestations: Secondary | ICD-10-CM | POA: Diagnosis not present

## 2023-09-13 DIAGNOSIS — Z91010 Allergy to peanuts: Secondary | ICD-10-CM | POA: Insufficient documentation

## 2023-09-13 DIAGNOSIS — R0789 Other chest pain: Secondary | ICD-10-CM | POA: Diagnosis not present

## 2023-09-13 DIAGNOSIS — R059 Cough, unspecified: Secondary | ICD-10-CM | POA: Diagnosis not present

## 2023-09-13 DIAGNOSIS — Z20822 Contact with and (suspected) exposure to covid-19: Secondary | ICD-10-CM | POA: Insufficient documentation

## 2023-09-13 DIAGNOSIS — R000 Tachycardia, unspecified: Secondary | ICD-10-CM | POA: Diagnosis not present

## 2023-09-13 LAB — RESP PANEL BY RT-PCR (RSV, FLU A&B, COVID)  RVPGX2
Influenza A by PCR: POSITIVE — AB
Influenza B by PCR: NEGATIVE
Resp Syncytial Virus by PCR: NEGATIVE
SARS Coronavirus 2 by RT PCR: NEGATIVE

## 2023-09-13 MED ORDER — NAPROXEN 375 MG PO TABS
375.0000 mg | ORAL_TABLET | Freq: Two times a day (BID) | ORAL | 0 refills | Status: AC
  Filled 2023-09-13: qty 20, 10d supply, fill #0

## 2023-09-13 MED ORDER — ONDANSETRON 4 MG PO TBDP
4.0000 mg | ORAL_TABLET | Freq: Once | ORAL | Status: AC | PRN
  Administered 2023-09-13: 4 mg via ORAL
  Filled 2023-09-13: qty 1

## 2023-09-13 MED ORDER — BENZONATATE 100 MG PO CAPS
100.0000 mg | ORAL_CAPSULE | Freq: Three times a day (TID) | ORAL | 0 refills | Status: DC
  Filled 2023-09-13: qty 21, 7d supply, fill #0

## 2023-09-13 MED ORDER — ACETAMINOPHEN 325 MG PO TABS
650.0000 mg | ORAL_TABLET | Freq: Once | ORAL | Status: AC | PRN
  Administered 2023-09-13: 650 mg via ORAL
  Filled 2023-09-13: qty 2

## 2023-09-13 MED ORDER — OSELTAMIVIR PHOSPHATE 75 MG PO CAPS
75.0000 mg | ORAL_CAPSULE | Freq: Two times a day (BID) | ORAL | 0 refills | Status: DC
  Filled 2023-09-13: qty 10, 5d supply, fill #0

## 2023-09-13 NOTE — ED Provider Notes (Signed)
[place] [facility] Provider Note   CSN: [id number] Arrival date & time: 09/13/23  3086     History  Chief Complaint  Patient presents with   Fatigue    Emma Boyer is a 28 y.o. female.  HPI   Patient states she started having trouble with body aches headaches cough nausea.  Patient has also had some fevers at home.  She has been exposed to other ill contacts.  Patient continued having diffuse bodyaches.  She went to an urgent care yesterday.  She was diagnosed with a viral respiratory infection as she initially had a negative COVID and flu test.  Patient presented to the ED today because of persistent symptoms.  Home Medications Prior to Admission medications   Medication Sig Start Date End Date Taking? Authorizing Provider  benzonatate (TESSALON) 100 MG capsule Take 1 capsule (100 mg total) by mouth every 8 (eight) hours. 09/13/23  Yes Linwood Dibbles, MD  naproxen (NAPROSYN) 375 MG tablet Take 1 tablet (375 mg total) by mouth 2 (two) times daily. 09/13/23  Yes Linwood Dibbles, MD  oseltamivir (TAMIFLU) 75 MG capsule Take 1 capsule (75 mg total) by mouth every 12 (twelve) hours. 09/13/23  Yes Linwood Dibbles, MD  albuterol (VENTOLIN HFA) 108 (90 Base) MCG/ACT inhaler Inhale 2 puffs into the lungs every 6 (six) hours as needed for wheezing or shortness of breath. 09/12/23   Landis Martins, PA-C  amphetamine-dextroamphetamine (ADDERALL XR) 20 MG 24 hr capsule Take 1 capsule (20 mg total) by mouth every morning. 08/19/23   Breeback, Lonna Cobb, PA-C  cyclobenzaprine (FLEXERIL) 10 MG tablet Take 1 tablet (10 mg total) by mouth 3 (three) times daily as needed. 05/11/23   Breeback, Lonna Cobb, PA-C  DULoxetine (CYMBALTA) 20 MG capsule Take 1 capsule (20 mg total) by mouth daily. 08/19/23   Breeback, Lonna Cobb, PA-C  Liraglutide -Weight Management (SAXENDA) 18 MG/3ML SOPN 0.5 Subcutaneous Once a day    [provider]  methylphenidate (RITALIN) 20 MG tablet 1 tablet on  an empty stomach Orally Once a day    [provider]  metoCLOPramide (REGLAN) 10 MG tablet 1 tablet before meals Orally Twice a day As needed    [provider]  omeprazole (PRILOSEC) 40 MG capsule Take 1 capsule (40 mg total) by mouth 2 (two) times daily. 10/05/22   Doree Albee, PA-C  phentermine (ADIPEX-P) 37.5 MG tablet 1 capsule Orally once a day    [provider]  predniSONE (DELTASONE) 20 MG tablet Take 2 tablets (40 mg total) by mouth daily with breakfast for 5 days. 09/12/23 09/17/23  Landis Martins, PA-C  promethazine-dextromethorphan (PROMETHAZINE-DM) 6.25-15 MG/5ML syrup Take 5 mLs by mouth every 8 (eight) hours as needed for cough. 09/12/23   White, Elizabeth A, PA-C  sertraline (ZOLOFT) 100 MG tablet Take 2 tablets (200 mg total) by mouth in the morning. 08/22/23   Breeback, Jade L, PA-C  topiramate (TOPAMAX) 100 MG tablet 1 tablet Orally once a day    [provider]  traZODone (DESYREL) 50 MG tablet Take 2 tablets (100 mg total) by mouth at bedtime. 05/11/23   Jomarie Longs, PA-C      Allergies    Peanut-containing drug products    Review of Systems   Review of Systems  Physical Exam Updated Vital Signs BP 138/80 (BP Location: Right Arm)   Pulse 98   Temp 98.2 F (36.8 C) (Oral)   Resp 20  Ht 1.702 m (5\' 7" )   Wt (!) 158.8 kg   LMP 08/22/2023   SpO2 100%   BMI 54.82 kg/m  Physical Exam Vitals and nursing note reviewed.  Constitutional:      General: She is not in acute distress.    Appearance: She is well-developed.  HENT:     Head: Normocephalic and atraumatic.     Right Ear: External ear normal.     Left Ear: External ear normal.  Eyes:     General: No scleral icterus.       Right eye: No discharge.        Left eye: No discharge.     Conjunctiva/sclera: Conjunctivae normal.  Neck:     Trachea: No tracheal deviation.  Cardiovascular:     Rate and Rhythm: Normal rate and regular rhythm.  Pulmonary:      Effort: Pulmonary effort is normal. No respiratory distress.     Breath sounds: No stridor. No wheezing.  Abdominal:     General: There is no distension.  Musculoskeletal:        General: No swelling or deformity.     Cervical back: Neck supple.     Right lower leg: No edema.     Left lower leg: No edema.  Skin:    General: Skin is warm and dry.     Findings: No rash.  Neurological:     Mental Status: She is alert. Mental status is at baseline.     Cranial Nerves: No dysarthria or facial asymmetry.     Motor: No seizure activity.     ED Results / Procedures / Treatments   Labs (all labs ordered are listed, but only abnormal results are displayed) Labs Reviewed  RESP PANEL BY RT-PCR (RSV, FLU A&B, COVID)  [id number] - Abnormal; Notable for the following components:      Result Value   Influenza A by PCR POSITIVE (*)    All other components within normal limits    EKG EKG Interpretation Date/Time:  Tuesday September 13 2023 08:46:37 EST Ventricular Rate:  122 PR Interval:  131 QRS Duration:  84 QT Interval:  312 QTC Calculation: 443 R Axis:   59  Text Interpretation: Sinus tachycardia Low voltage, precordial leads Minimal ST depression, lateral leads Borderline ST elevation, anterior leads Baseline wander in lead(s) I II III aVR aVF V1 V2 V3 V4 V6 Poor data quality in current ECG precludes serial comparison Confirmed by Linwood Dibbles [phone]) on 09/13/2023 12:55:09 PM  Radiology No results found.  Procedures Procedures    Medications Ordered in ED Medications  acetaminophen (TYLENOL) tablet 650 mg (650 mg Oral Given 09/13/23 1129)  ondansetron (ZOFRAN-ODT) disintegrating tablet 4 mg (4 mg Oral Given 09/13/23 1104)    ED Course/ Medical Decision Making/ A&P                                 Medical Decision Making Risk OTC drugs. Prescription drug management.   Patient presented with URI type symptoms myalgias.  Patient initially noted to be tachycardic but heart rate  improved without intervention.  EKG was consistent with a sinus tachycardia.  Patient's influenza was positive.  Otherwise she is nontoxic-appearing she does not have an oxygen requirement.  Evaluation and diagnostic testing in the emergency department does not suggest an emergent condition requiring admission or immediate intervention beyond what has been performed at this time.  The patient is  safe for discharge and has been instructed to return immediately for worsening symptoms, change in symptoms or any other concerns.        Final Clinical Impression(s) / ED Diagnoses Final diagnoses:  Influenza A    Rx / DC Orders ED Discharge Orders          Ordered    benzonatate (TESSALON) 100 MG capsule  Every 8 hours        09/13/23 1304    oseltamivir (TAMIFLU) 75 MG capsule  Every 12 hours        09/13/23 1304    naproxen (NAPROSYN) 375 MG tablet  2 times daily        09/13/23 1304              Linwood Dibbles, MD 09/13/23 1307

## 2023-09-13 NOTE — Discharge Instructions (Addendum)
Take the medications as prescribed to help with your influenza symptoms.  You can also supplement with Tylenol.  Your symptoms may take several days to week to completely resolve.  Rest and drink plenty of fluids.

## 2023-09-13 NOTE — ED Notes (Signed)
Will refrain from Tylenol administration still PO Zofran becomes effective for nausea reduction.

## 2023-09-13 NOTE — ED Triage Notes (Signed)
C/o body aches, nausea, emesis, and chest congestion since Sunday. States she has had episodes of dizziness and lightheaded. Denies [facility]

## 2023-09-19 ENCOUNTER — Other Ambulatory Visit: Payer: Self-pay | Admitting: Physician Assistant

## 2023-09-19 ENCOUNTER — Other Ambulatory Visit (HOSPITAL_COMMUNITY): Payer: Self-pay

## 2023-09-19 ENCOUNTER — Encounter: Payer: Self-pay | Admitting: Hematology & Oncology

## 2023-09-19 ENCOUNTER — Ambulatory Visit: Payer: BC Managed Care – PPO | Admitting: Physician Assistant

## 2023-09-19 ENCOUNTER — Other Ambulatory Visit: Payer: Self-pay

## 2023-09-19 DIAGNOSIS — G8929 Other chronic pain: Secondary | ICD-10-CM

## 2023-09-19 DIAGNOSIS — M51362 Other intervertebral disc degeneration, lumbar region with discogenic back pain and lower extremity pain: Secondary | ICD-10-CM

## 2023-09-19 MED ORDER — DULOXETINE HCL 20 MG PO CPEP
20.0000 mg | ORAL_CAPSULE | Freq: Every day | ORAL | 0 refills | Status: DC
  Filled 2023-09-19 (×2): qty 30, 30d supply, fill #0

## 2023-09-20 ENCOUNTER — Encounter: Payer: Self-pay | Admitting: Physician Assistant

## 2023-09-21 ENCOUNTER — Other Ambulatory Visit: Payer: Self-pay

## 2023-09-21 ENCOUNTER — Other Ambulatory Visit (HOSPITAL_COMMUNITY): Payer: Self-pay

## 2023-09-21 MED ORDER — METOCLOPRAMIDE HCL 10 MG PO TABS
10.0000 mg | ORAL_TABLET | Freq: Two times a day (BID) | ORAL | 0 refills | Status: AC | PRN
  Filled 2023-09-21: qty 180, 90d supply, fill #0

## 2023-09-22 ENCOUNTER — Other Ambulatory Visit: Payer: Self-pay | Admitting: Physician Assistant

## 2023-09-22 ENCOUNTER — Other Ambulatory Visit: Payer: Self-pay

## 2023-09-22 ENCOUNTER — Other Ambulatory Visit (HOSPITAL_BASED_OUTPATIENT_CLINIC_OR_DEPARTMENT_OTHER): Payer: Self-pay

## 2023-09-22 DIAGNOSIS — F419 Anxiety disorder, unspecified: Secondary | ICD-10-CM

## 2023-09-22 DIAGNOSIS — G4700 Insomnia, unspecified: Secondary | ICD-10-CM

## 2023-09-22 DIAGNOSIS — K219 Gastro-esophageal reflux disease without esophagitis: Secondary | ICD-10-CM

## 2023-09-22 DIAGNOSIS — R112 Nausea with vomiting, unspecified: Secondary | ICD-10-CM

## 2023-09-22 DIAGNOSIS — J40 Bronchitis, not specified as acute or chronic: Secondary | ICD-10-CM

## 2023-09-22 DIAGNOSIS — G8929 Other chronic pain: Secondary | ICD-10-CM

## 2023-09-22 DIAGNOSIS — M51369 Other intervertebral disc degeneration, lumbar region without mention of lumbar back pain or lower extremity pain: Secondary | ICD-10-CM

## 2023-09-22 DIAGNOSIS — F902 Attention-deficit hyperactivity disorder, combined type: Secondary | ICD-10-CM

## 2023-09-22 DIAGNOSIS — M51362 Other intervertebral disc degeneration, lumbar region with discogenic back pain and lower extremity pain: Secondary | ICD-10-CM

## 2023-09-22 MED ORDER — DULOXETINE HCL 20 MG PO CPEP
20.0000 mg | ORAL_CAPSULE | Freq: Every day | ORAL | 0 refills | Status: DC
  Filled 2023-10-10: qty 30, 30d supply, fill #0

## 2023-09-22 MED ORDER — ALBUTEROL SULFATE HFA 108 (90 BASE) MCG/ACT IN AERS: 2.0000 | INHALATION_SPRAY | Freq: Four times a day (QID) | RESPIRATORY_TRACT | 2 refills | Status: AC | PRN

## 2023-09-22 MED ORDER — PROMETHAZINE-DM 6.25-15 MG/5ML PO SYRP: 5.0000 mL | ORAL_SOLUTION | Freq: Three times a day (TID) | ORAL | 0 refills | Status: DC | PRN

## 2023-09-22 MED ORDER — CYCLOBENZAPRINE HCL 10 MG PO TABS: 10.0000 mg | ORAL_TABLET | Freq: Three times a day (TID) | ORAL | 3 refills | Status: DC | PRN

## 2023-09-23 MED ORDER — SERTRALINE HCL 100 MG PO TABS: 200.0000 mg | ORAL_TABLET | Freq: Every morning | ORAL | 1 refills | Status: DC

## 2023-09-23 MED ORDER — AMPHETAMINE-DEXTROAMPHET ER 20 MG PO CP24: 20.0000 mg | ORAL_CAPSULE | ORAL | 0 refills | Status: DC

## 2023-09-27 ENCOUNTER — Other Ambulatory Visit (HOSPITAL_BASED_OUTPATIENT_CLINIC_OR_DEPARTMENT_OTHER): Payer: Self-pay

## 2023-09-27 MED ORDER — CYCLOBENZAPRINE HCL 10 MG PO TABS
10.0000 mg | ORAL_TABLET | Freq: Three times a day (TID) | ORAL | 3 refills | Status: DC | PRN
  Filled 2023-09-27: qty 30, 10d supply, fill #0

## 2023-10-10 ENCOUNTER — Other Ambulatory Visit (HOSPITAL_BASED_OUTPATIENT_CLINIC_OR_DEPARTMENT_OTHER): Payer: Self-pay

## 2023-10-10 ENCOUNTER — Other Ambulatory Visit: Payer: Self-pay | Admitting: Physician Assistant

## 2023-10-10 DIAGNOSIS — F902 Attention-deficit hyperactivity disorder, combined type: Secondary | ICD-10-CM

## 2023-10-12 NOTE — Telephone Encounter (Signed)
 Duplicate request. Last rx sent on 09/23/23.

## 2023-10-13 ENCOUNTER — Other Ambulatory Visit: Payer: Self-pay | Admitting: Physician Assistant

## 2023-10-13 DIAGNOSIS — F902 Attention-deficit hyperactivity disorder, combined type: Secondary | ICD-10-CM

## 2023-10-14 ENCOUNTER — Other Ambulatory Visit (HOSPITAL_BASED_OUTPATIENT_CLINIC_OR_DEPARTMENT_OTHER): Payer: Self-pay

## 2023-10-14 ENCOUNTER — Other Ambulatory Visit: Payer: Self-pay

## 2023-10-14 MED ORDER — AMPHETAMINE-DEXTROAMPHET ER 20 MG PO CP24
20.0000 mg | ORAL_CAPSULE | ORAL | 0 refills | Status: DC
Start: 2023-10-14 — End: 2023-11-23
  Filled 2023-10-14: qty 30, 30d supply, fill #0

## 2023-10-14 MED ORDER — AMPHETAMINE-DEXTROAMPHET ER 20 MG PO CP24
20.0000 mg | ORAL_CAPSULE | ORAL | 0 refills | Status: DC
  Filled 2023-11-22: qty 30, 30d supply, fill #0

## 2023-10-14 MED ORDER — AMPHETAMINE-DEXTROAMPHET ER 20 MG PO CP24
20.0000 mg | ORAL_CAPSULE | ORAL | 0 refills | Status: DC
Start: 2023-12-13 — End: 2024-05-30
  Filled 2024-01-16: qty 30, 30d supply, fill #0

## 2023-10-21 ENCOUNTER — Other Ambulatory Visit (HOSPITAL_BASED_OUTPATIENT_CLINIC_OR_DEPARTMENT_OTHER): Payer: Self-pay

## 2023-11-22 ENCOUNTER — Ambulatory Visit (INDEPENDENT_AMBULATORY_CARE_PROVIDER_SITE_OTHER): Admitting: Physician Assistant

## 2023-11-22 ENCOUNTER — Encounter: Payer: Self-pay | Admitting: Physician Assistant

## 2023-11-22 ENCOUNTER — Other Ambulatory Visit (HOSPITAL_BASED_OUTPATIENT_CLINIC_OR_DEPARTMENT_OTHER): Payer: Self-pay

## 2023-11-22 VITALS — BP 140/80 | HR 96 | Ht 67.0 in | Wt 362.0 lb

## 2023-11-22 DIAGNOSIS — F419 Anxiety disorder, unspecified: Secondary | ICD-10-CM

## 2023-11-22 DIAGNOSIS — M51362 Other intervertebral disc degeneration, lumbar region with discogenic back pain and lower extremity pain: Secondary | ICD-10-CM

## 2023-11-22 DIAGNOSIS — G8929 Other chronic pain: Secondary | ICD-10-CM

## 2023-11-22 DIAGNOSIS — G4700 Insomnia, unspecified: Secondary | ICD-10-CM

## 2023-11-22 DIAGNOSIS — F902 Attention-deficit hyperactivity disorder, combined type: Secondary | ICD-10-CM | POA: Diagnosis not present

## 2023-11-22 MED ORDER — DULOXETINE HCL 30 MG PO CPEP
30.0000 mg | ORAL_CAPSULE | Freq: Every day | ORAL | 1 refills | Status: DC
  Filled 2023-11-22: qty 90, 90d supply, fill #0
  Filled 2024-02-20: qty 90, 90d supply, fill #1

## 2023-11-22 MED ORDER — TRAZODONE HCL 100 MG PO TABS
100.0000 mg | ORAL_TABLET | Freq: Every day | ORAL | 1 refills | Status: DC
  Filled 2023-11-22: qty 90, 90d supply, fill #0
  Filled 2024-02-20: qty 90, 90d supply, fill #1

## 2023-11-22 NOTE — Patient Instructions (Signed)
 Zoloft 100mg  for 6 weeks, then decrease to 50mg  for 6 weeks then 1/2 25mg  for 4 weeks then stop.

## 2023-11-23 ENCOUNTER — Encounter: Payer: Self-pay | Admitting: Physician Assistant

## 2023-11-23 ENCOUNTER — Other Ambulatory Visit (HOSPITAL_BASED_OUTPATIENT_CLINIC_OR_DEPARTMENT_OTHER): Payer: Self-pay

## 2023-11-23 MED ORDER — AMPHETAMINE-DEXTROAMPHET ER 20 MG PO CP24
20.0000 mg | ORAL_CAPSULE | ORAL | 0 refills | Status: DC
  Filled 2024-03-05: qty 30, 30d supply, fill #0

## 2023-11-23 MED ORDER — AMPHETAMINE-DEXTROAMPHET ER 20 MG PO CP24: 20.0000 mg | ORAL_CAPSULE | ORAL | 0 refills | Status: DC

## 2023-11-23 NOTE — Progress Notes (Signed)
 Established Patient Office Visit  Subjective   Patient ID: Emma Boyer, female    DOB: [birth year]  Age: 28 y.o. MRN: [id number]  Chief Complaint  Patient presents with   Medical Management of Chronic Issues    Refills on Meds    HPI Pt is a 28 yo obese female who presents to the clinic for medication refills.   She is doing really well. She feels like adding cymbalta has helped anxiety and mood as well as pain. She would like to start weaning off zoloft.   She continues to use adderall for ADHD. No concerns. She is using trazodone at night for insomnia and doing well.    ROS See HPI.    Objective:     BP (!) 140/80   Pulse 96   Ht 5\' 7"  (1.702 m)   Wt (!) 362 lb (164.2 kg)   SpO2 99%   BMI 56.70 kg/m  BP Readings from Last 3 Encounters:  11/22/23 (!) 140/80  09/13/23 138/80  09/12/23 (!) 167/78   Wt Readings from Last 3 Encounters:  11/22/23 (!) 362 lb (164.2 kg)  09/13/23 (!) 350 lb (158.8 kg)  09/12/23 (!) 350 lb (158.8 kg)      Physical Exam Constitutional:      Appearance: Normal appearance. She is obese.  HENT:     Head: Normocephalic.  Cardiovascular:     Rate and Rhythm: Normal rate and regular rhythm.  Pulmonary:     Effort: Pulmonary effort is normal.     Breath sounds: Normal breath sounds.  Neurological:     General: No focal deficit present.     Mental Status: She is alert and oriented to person, place, and time.  Psychiatric:        Mood and Affect: Mood normal.   ..    11/22/2023    3:51 PM 03/14/2023    4:06 PM 03/14/2023    3:32 PM 03/14/2023    3:31 PM 03/04/2023    4:24 PM  Depression screen PHQ 2/9  Decreased Interest 0 1 2 2 1   Down, Depressed, Hopeless 0 0 2 2 1   PHQ - 2 Score 0 1 4 4 2   Altered sleeping 0 0  0 2  Tired, decreased energy 1 0  2 3  Change in appetite 0 0  0 0  Feeling bad or failure about yourself  0 0  0 0  Trouble concentrating 0 1  0 0  Moving slowly or fidgety/restless 0 0  0 0  Suicidal thoughts 0  0  0 0  PHQ-9 Score 1 2  6 7   Difficult doing work/chores Not difficult at all Not difficult at all  Somewhat difficult Somewhat difficult   ..    11/22/2023    3:51 PM 03/14/2023    4:06 PM 03/14/2023    3:32 PM 03/04/2023    4:25 PM  GAD 7 : Generalized Anxiety Score  Nervous, Anxious, on Edge 0 0 2 2  Control/stop worrying 0 0 2 1  Worry too much - different things 0 1 1 1   Trouble relaxing 0 0 1 2  Restless 0 1 0 2  Easily annoyed or irritable 0 0 1 1  Afraid - awful might happen 0 0 0 0  Total GAD 7 Score 0 2 7 9   Anxiety Difficulty Not difficult at all Not difficult at all Somewhat difficult Very difficult          Assessment &  Plan:  ..Sahej was seen today for medical management of chronic issues.  Diagnoses and all orders for this visit:  Chronic bilateral low back pain with right-sided sciatica -     DULoxetine (CYMBALTA) 30 MG capsule; Take 1 capsule (30 mg total) by mouth daily.  Insomnia, unspecified type -     traZODone (DESYREL) 100 MG tablet; Take 1 tablet (100 mg total) by mouth at bedtime.  Attention deficit hyperactivity disorder (ADHD), combined type -     amphetamine-dextroamphetamine (ADDERALL XR) 20 MG 24 hr capsule; Take 1 capsule (20 mg total) by mouth every morning. -     amphetamine-dextroamphetamine (ADDERALL XR) 20 MG 24 hr capsule; Take 1 capsule (20 mg total) by mouth every morning.  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  Anxiety -     DULoxetine (CYMBALTA) 30 MG capsule; Take 1 capsule (30 mg total) by mouth daily.   PHQ/GAD look great Increased cymbalta to 30mg  Decrease zoloft to 100mg  for 6 weeks then 50mg  of 6weeks then 25mg  for 4 weeks then stop If notice mood changes follow up and stay at that dose   Return in about 6 months (around 05/23/2024).    Tandy Gaw, PA-C

## 2023-11-25 ENCOUNTER — Other Ambulatory Visit (HOSPITAL_BASED_OUTPATIENT_CLINIC_OR_DEPARTMENT_OTHER): Payer: Self-pay

## 2023-11-25 ENCOUNTER — Encounter: Payer: Self-pay | Admitting: Physician Assistant

## 2023-11-25 MED ORDER — AMPHETAMINE-DEXTROAMPHET ER 20 MG PO CP24
20.0000 mg | ORAL_CAPSULE | Freq: Every day | ORAL | 0 refills | Status: DC
  Filled 2023-11-25: qty 30, 30d supply, fill #0

## 2023-12-06 ENCOUNTER — Other Ambulatory Visit (HOSPITAL_BASED_OUTPATIENT_CLINIC_OR_DEPARTMENT_OTHER): Payer: Self-pay

## 2023-12-06 ENCOUNTER — Telehealth: Admitting: Physician Assistant

## 2023-12-06 DIAGNOSIS — J208 Acute bronchitis due to other specified organisms: Secondary | ICD-10-CM

## 2023-12-06 DIAGNOSIS — B9689 Other specified bacterial agents as the cause of diseases classified elsewhere: Secondary | ICD-10-CM

## 2023-12-06 MED ORDER — AZITHROMYCIN 250 MG PO TABS
ORAL_TABLET | ORAL | 0 refills | Status: AC
  Filled 2023-12-06: qty 6, 5d supply, fill #0

## 2023-12-06 MED ORDER — BENZONATATE 100 MG PO CAPS
100.0000 mg | ORAL_CAPSULE | Freq: Three times a day (TID) | ORAL | 0 refills | Status: DC | PRN
  Filled 2023-12-06: qty 30, 5d supply, fill #0

## 2023-12-06 NOTE — Progress Notes (Signed)

## 2024-01-13 ENCOUNTER — Other Ambulatory Visit: Payer: Self-pay | Admitting: Physician Assistant

## 2024-01-16 ENCOUNTER — Other Ambulatory Visit (HOSPITAL_BASED_OUTPATIENT_CLINIC_OR_DEPARTMENT_OTHER): Payer: Self-pay

## 2024-02-20 ENCOUNTER — Other Ambulatory Visit (HOSPITAL_BASED_OUTPATIENT_CLINIC_OR_DEPARTMENT_OTHER): Payer: Self-pay

## 2024-03-05 ENCOUNTER — Other Ambulatory Visit: Payer: Self-pay

## 2024-03-05 ENCOUNTER — Other Ambulatory Visit: Payer: Self-pay | Admitting: Physician Assistant

## 2024-03-05 ENCOUNTER — Other Ambulatory Visit (HOSPITAL_BASED_OUTPATIENT_CLINIC_OR_DEPARTMENT_OTHER): Payer: Self-pay

## 2024-03-05 DIAGNOSIS — R112 Nausea with vomiting, unspecified: Secondary | ICD-10-CM

## 2024-03-05 DIAGNOSIS — K219 Gastro-esophageal reflux disease without esophagitis: Secondary | ICD-10-CM

## 2024-03-05 MED ORDER — OMEPRAZOLE 40 MG PO CPDR
40.0000 mg | DELAYED_RELEASE_CAPSULE | Freq: Two times a day (BID) | ORAL | 1 refills | Status: DC
  Filled 2024-03-05: qty 90, 45d supply, fill #0
  Filled 2024-04-15: qty 90, 45d supply, fill #1

## 2024-03-06 NOTE — Telephone Encounter (Signed)
 Patient last seen by digestive health in 01/2023.

## 2024-04-15 ENCOUNTER — Other Ambulatory Visit: Payer: Self-pay | Admitting: Physician Assistant

## 2024-04-15 ENCOUNTER — Other Ambulatory Visit (HOSPITAL_BASED_OUTPATIENT_CLINIC_OR_DEPARTMENT_OTHER): Payer: Self-pay

## 2024-04-17 ENCOUNTER — Other Ambulatory Visit: Payer: Self-pay

## 2024-04-18 ENCOUNTER — Ambulatory Visit: Admitting: Obstetrics and Gynecology

## 2024-04-20 ENCOUNTER — Other Ambulatory Visit (HOSPITAL_BASED_OUTPATIENT_CLINIC_OR_DEPARTMENT_OTHER): Payer: Self-pay

## 2024-04-20 ENCOUNTER — Encounter: Payer: Self-pay | Admitting: Physician Assistant

## 2024-04-20 MED ORDER — AMPHETAMINE-DEXTROAMPHET ER 20 MG PO CP24
20.0000 mg | ORAL_CAPSULE | Freq: Every day | ORAL | 0 refills | Status: DC
  Filled 2024-04-20: qty 30, 30d supply, fill #0

## 2024-04-24 ENCOUNTER — Ambulatory Visit: Admitting: Physician Assistant

## 2024-04-30 ENCOUNTER — Ambulatory Visit: Admitting: Physician Assistant

## 2024-05-23 ENCOUNTER — Ambulatory Visit: Admitting: Physician Assistant

## 2024-05-23 ENCOUNTER — Ambulatory Visit

## 2024-05-25 ENCOUNTER — Ambulatory Visit: Admitting: Physician Assistant

## 2024-05-30 ENCOUNTER — Encounter: Payer: Self-pay | Admitting: Physician Assistant

## 2024-05-30 ENCOUNTER — Ambulatory Visit (INDEPENDENT_AMBULATORY_CARE_PROVIDER_SITE_OTHER): Admitting: Physician Assistant

## 2024-05-30 ENCOUNTER — Other Ambulatory Visit (HOSPITAL_BASED_OUTPATIENT_CLINIC_OR_DEPARTMENT_OTHER): Payer: Self-pay

## 2024-05-30 VITALS — BP 128/60 | HR 118 | Wt 363.0 lb

## 2024-05-30 DIAGNOSIS — K219 Gastro-esophageal reflux disease without esophagitis: Secondary | ICD-10-CM

## 2024-05-30 DIAGNOSIS — M5441 Lumbago with sciatica, right side: Secondary | ICD-10-CM

## 2024-05-30 DIAGNOSIS — G4700 Insomnia, unspecified: Secondary | ICD-10-CM

## 2024-05-30 DIAGNOSIS — G8929 Other chronic pain: Secondary | ICD-10-CM | POA: Diagnosis not present

## 2024-05-30 DIAGNOSIS — F419 Anxiety disorder, unspecified: Secondary | ICD-10-CM

## 2024-05-30 DIAGNOSIS — R112 Nausea with vomiting, unspecified: Secondary | ICD-10-CM

## 2024-05-30 DIAGNOSIS — F902 Attention-deficit hyperactivity disorder, combined type: Secondary | ICD-10-CM

## 2024-05-30 MED ORDER — OMEPRAZOLE 40 MG PO CPDR
40.0000 mg | DELAYED_RELEASE_CAPSULE | Freq: Two times a day (BID) | ORAL | 1 refills | Status: AC
  Filled 2024-05-30: qty 90, 45d supply, fill #0

## 2024-05-30 MED ORDER — AMPHETAMINE-DEXTROAMPHET ER 20 MG PO CP24
20.0000 mg | ORAL_CAPSULE | Freq: Every day | ORAL | 0 refills | Status: AC
  Filled 2024-09-03: qty 30, 30d supply, fill #0

## 2024-05-30 MED ORDER — TRAZODONE HCL 100 MG PO TABS
100.0000 mg | ORAL_TABLET | Freq: Every day | ORAL | 1 refills | Status: AC
  Filled 2024-05-30: qty 90, 90d supply, fill #0
  Filled 2024-09-03: qty 90, 90d supply, fill #1

## 2024-05-30 MED ORDER — SERTRALINE HCL 100 MG PO TABS
200.0000 mg | ORAL_TABLET | Freq: Every morning | ORAL | 1 refills | Status: AC
  Filled 2024-05-30: qty 180, 90d supply, fill #0

## 2024-05-30 MED ORDER — AMPHETAMINE-DEXTROAMPHET ER 20 MG PO CP24
20.0000 mg | ORAL_CAPSULE | ORAL | 0 refills | Status: AC
  Filled 2024-07-16: qty 10, 10d supply, fill #0
  Filled 2024-07-16: qty 30, 30d supply, fill #0
  Filled 2024-07-16: qty 20, 20d supply, fill #0

## 2024-05-30 MED ORDER — CYCLOBENZAPRINE HCL 10 MG PO TABS
10.0000 mg | ORAL_TABLET | Freq: Three times a day (TID) | ORAL | 3 refills | Status: AC | PRN
  Filled 2024-05-30: qty 90, 30d supply, fill #0

## 2024-05-30 MED ORDER — DULOXETINE HCL 60 MG PO CPEP
60.0000 mg | ORAL_CAPSULE | Freq: Every day | ORAL | 1 refills | Status: AC
  Filled 2024-05-30: qty 90, 90d supply, fill #0

## 2024-05-30 MED ORDER — AMPHETAMINE-DEXTROAMPHET ER 20 MG PO CP24
20.0000 mg | ORAL_CAPSULE | ORAL | 0 refills | Status: AC
  Filled 2024-05-30: qty 30, 30d supply, fill #0

## 2024-05-30 NOTE — Progress Notes (Signed)
 Established Patient Office Visit  Subjective   Patient ID: Emma Boyer, female    DOB: Sep 09, 1995  Age: 28 y.o. MRN: [id number]  Chief Complaint  Patient presents with   Medical Management of Chronic Issues    HPI .SABRADiscussed the use of AI scribe software for clinical note transcription with the patient, who gave verbal consent to proceed.  History of Present Illness Emma Boyer is a 28 year old female with chronic low back pain due to lumbar degenerative disc disease who presents for medication refills and follow-up.  Chronic low back pain - Chronic low back pain due to lumbar degenerative disc disease - Pain is generally well-controlled with current regimen - Exacerbation of pain today in the usual location - Currently taking Cymbalta  and Flexeril  for symptom management  Attention deficit hyperactivity disorder (adhd) symptoms - Currently taking Adderall, which is effective for symptom control - No side effects such as palpitations, chest pain, or shortness of breath - Experiences feeling 'spacey' in the afternoons, attributed to ADHD - mood and sleep are well controlled  Physical activity tolerance - Attempts to stay active but finds it challenging - Recognizes benefits of walking and expresses desire to increase physical activity   ROS See HPI.    Objective:     BP 128/60   Pulse (!) 118   Wt (!) 363 lb (164.7 kg)   SpO2 99%   BMI 56.85 kg/m  BP Readings from Last 3 Encounters:  05/30/24 128/60  11/22/23 (!) 140/80  09/13/23 138/80   Wt Readings from Last 3 Encounters:  05/30/24 (!) 363 lb (164.7 kg)  11/22/23 (!) 362 lb (164.2 kg)  09/13/23 (!) 350 lb (158.8 kg)    .[name]    05/30/2024    3:46 PM 11/22/2023    3:51 PM 03/14/2023    4:06 PM 03/14/2023    3:32 PM 03/14/2023    3:31 PM  Depression screen PHQ 2/9  Decreased Interest 0 0 1 2 2   Down, Depressed, Hopeless 0 0 0 2 2  PHQ - 2 Score 0 0 1 4 4   Altered sleeping 0 0 0  0  Tired,  decreased energy 0 1 0  2  Change in appetite 0 0 0  0  Feeling bad or failure about yourself  0 0 0  0  Trouble concentrating 0 0 1  0  Moving slowly or fidgety/restless 0 0 0  0  Suicidal thoughts 0 0 0  0  PHQ-9 Score 0 1 2  6   Difficult doing work/chores Not difficult at all Not difficult at all Not difficult at all  Somewhat difficult     Physical Exam Constitutional:      Appearance: Normal appearance. She is obese.  HENT:     Head: Normocephalic.  Pulmonary:     Effort: Pulmonary effort is normal.  Neurological:     Mental Status: She is alert and oriented to person, place, and time.  Psychiatric:        Mood and Affect: Mood normal.         Assessment & Plan:  [name] was seen today for medical management of chronic issues.  Diagnoses and all orders for this visit:  Attention deficit hyperactivity disorder (ADHD), combined type -     amphetamine -dextroamphetamine  (ADDERALL XR) 20 MG 24 hr capsule; Take 1 capsule (20 mg total) by mouth every morning. -     amphetamine -dextroamphetamine  (ADDERALL XR) 20 MG 24 hr capsule; Take 1  capsule (20 mg total) by mouth every morning. -     amphetamine -dextroamphetamine  (ADDERALL XR) 20 MG 24 hr capsule; Take 1 capsule (20 mg total) by mouth daily.  Chronic bilateral low back pain with right-sided sciatica -     cyclobenzaprine  (FLEXERIL ) 10 MG tablet; Take 1 tablet (10 mg total) by mouth 3 (three) times daily as needed. -     DULoxetine  (CYMBALTA ) 60 MG capsule; Take 1 capsule (60 mg total) by mouth daily.  Insomnia, unspecified type -     traZODone  (DESYREL ) 100 MG tablet; Take 1 tablet (100 mg total) by mouth at bedtime.  Non-intractable vomiting with nausea -     omeprazole  (PRILOSEC) 40 MG capsule; Take 1 capsule (40 mg total) by mouth 2 (two) times daily.  Gastroesophageal reflux disease, unspecified whether esophagitis present -     omeprazole  (PRILOSEC) 40 MG capsule; Take 1 capsule (40 mg total) by mouth 2 (two)  times daily.  Anxiety -     sertraline  (ZOLOFT ) 100 MG tablet; Take 2 tablets (200 mg total) by mouth in the morning.   Assessment & Plan Chronic low back pain due to lumbar degenerative disc disease Chronic low back pain with exacerbations due to degenerative disc disease. Current Duloxetine  dose is low for symptom control. - Increase Duloxetine  (Cymbalta ) to 60 mg oral daily.  Attention-deficit hyperactivity disorder, combined type ADHD well-managed with current Adderall XR dose. No adverse effects reported, but afternoon spaciness noted. - Refill Amphetamine -dextroamphetamine  (Adderall XR) 20 mg oral daily.  Anxiety, Well-Controlled - Refill Zoloft   Insomnia, Well-Controlled - Refill Trazodone   GERD, Well-Controlled - Refill Omeprazole   Follow up in 6 months     Emma Bologna, PA-C

## 2024-06-04 ENCOUNTER — Ambulatory Visit: Admitting: Obstetrics and Gynecology

## 2024-06-04 ENCOUNTER — Other Ambulatory Visit (HOSPITAL_COMMUNITY)
Admission: RE | Admit: 2024-06-04 | Discharge: 2024-06-04 | Disposition: A | Discharge status: Home / Self Care | Source: Ambulatory Visit | Attending: Obstetrics and Gynecology | Admitting: Obstetrics and Gynecology

## 2024-06-04 ENCOUNTER — Encounter: Payer: Self-pay | Admitting: Obstetrics and Gynecology

## 2024-06-04 VITALS — BP 126/75 | HR 120 | Ht 67.0 in | Wt 361.0 lb

## 2024-06-04 DIAGNOSIS — Z124 Encounter for screening for malignant neoplasm of cervix: Secondary | ICD-10-CM

## 2024-06-04 DIAGNOSIS — Z30013 Encounter for initial prescription of injectable contraceptive: Secondary | ICD-10-CM

## 2024-06-04 DIAGNOSIS — Z1331 Encounter for screening for depression: Secondary | ICD-10-CM | POA: Diagnosis not present

## 2024-06-04 DIAGNOSIS — Z01419 Encounter for gynecological examination (general) (routine) without abnormal findings: Secondary | ICD-10-CM

## 2024-06-04 LAB — POCT URINE PREGNANCY: Preg Test, Ur: NEGATIVE

## 2024-06-04 MED ORDER — MEDROXYPROGESTERONE ACETATE 150 MG/ML IM SUSP
150.0000 mg | Freq: Once | INTRAMUSCULAR | Status: AC
  Administered 2024-06-04: 150 mg via INTRAMUSCULAR

## 2024-06-04 NOTE — Progress Notes (Signed)
 Patient presents for Annual.  LMP: 05/24/2024 Last pap: 2021 Contraception: wants to discuss depo for bc  Mammogram: Not yet indicated STD Screening: Declines Flu Vaccine : Declines  CC: 2021 for last pap   Fun Fact:   [id number]- 0

## 2024-06-04 NOTE — Addendum Note (Signed)
 Addended by: JOMARIE SKIPPER D on: 06/04/2024 04:15 PM   Modules accepted: Orders

## 2024-06-04 NOTE — Progress Notes (Signed)
 ANNUAL GYNECOLOGY VISIT Chief Complaint  Patient presents with   Gynecologic Exam    Annual with pap wants to discuss [facility] the depo shot      Subjective:  Emma Boyer is a 28 y.o. G0P0000 who presents for annual visit.  Doing well, no concerns. Wants to talk about birth control. 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 Sexually active: yes/no: Yes Contraception: no method History of STIs: No Last pap: No results found for: DIAGPAP, HPV, HPVHIGH History of abnormal pap: No Periods: regular   The pregnancy intention screening data noted above was reviewed. Potential methods of contraception were discussed. The patient elected to proceed with No data recorded.       06/04/2024    3:42 PM 05/30/2024    3:46 PM 11/22/2023    3:51 PM 03/14/2023    4:06 PM 03/14/2023    3:32 PM  Depression screen PHQ 2/9  Decreased Interest 0 0 0 1 2  Down, Depressed, Hopeless 0 0 0 0 2  PHQ - 2 Score 0 0 0 1 4  Altered sleeping 0 0 0 0   Tired, decreased energy 0 0 1 0   Change in appetite 0 0 0 0   Feeling bad or failure about yourself  0 0 0 0   Trouble concentrating 0 0 0 1   Moving slowly or fidgety/restless 0 0 0 0   Suicidal thoughts 0 0 0 0   PHQ-9 Score 0 0 1 2   Difficult doing work/chores  Not difficult at all Not difficult at all Not difficult at all         06/04/2024    3:42 PM 05/30/2024    3:45 PM 11/22/2023    3:51 PM 03/14/2023    4:06 PM  GAD 7 : Generalized Anxiety Score  Nervous, Anxious, on Edge 0 0 0 0  Control/stop worrying 0 0 0 0  Worry too much - different things 0 1 0 1  Trouble relaxing 0 0 0 0  Restless 0 0 0 1  Easily annoyed or irritable 0 1 0 0  Afraid - awful might happen 0 0 0 0  Total GAD 7 Score 0 2 0 2  Anxiety Difficulty  Not difficult at all Not difficult at all Not difficult at all      OB History     Gravida  0    Para  0   Term  0   Preterm  0   AB  0   Living  0      SAB  0   IAB  0   Ectopic  0   Multiple  0   Live Births  0           Past Medical History:  Diagnosis Date   ADHD    Anxiety    Benign paroxysmal positional vertigo    Bulging lumbar disc 08/16/2012   Dry eye syndrome    HA (headache)    History of anal fissures    Low back pain    Neck pain    Pneumonia    Pseudopapilledema    Shoulder pain    Vision abnormalities    wears contacts, right eye worse    Past Surgical History:  Procedure Laterality Date   BIOPSY  11/04/2022   Procedure: BIOPSY;  Surgeon: Eda Iha, MD;  Location: [facility];  Service: Gastroenterology;;   COLONOSCOPY WITH PROPOFOL  N/A 11/04/2022   Procedure: COLONOSCOPY WITH PROPOFOL ;  Surgeon: Eda Iha, MD;  Location: [facility];  Service: Gastroenterology;  Laterality: N/A;  w biopsies   ESOPHAGOGASTRODUODENOSCOPY (EGD) WITH PROPOFOL  N/A 11/04/2022   Procedure: ESOPHAGOGASTRODUODENOSCOPY (EGD) WITH PROPOFOL ;  Surgeon: Eda Iha, MD;  Location: [facility];  Service: Gastroenterology;  Laterality: N/A;  w biopsies   SUBOCCIPITAL CRANIECTOMY CERVICAL LAMINECTOMY N/A 05/01/2013   Procedure: Chiari Decompression;  Surgeon: Fairy Levels, MD;  Location: [facility];  Service: Neurosurgery;  Laterality: N/A;  Chiari Decompression    Social History   Socioeconomic History   Marital status: Single    Spouse name: Not on file   Number of children: 0   Years of education: Not on file   Highest education level: Some college, no degree  Occupational History    Comment: Lawyer   Occupation: Lobbyist  Tobacco Use   Smoking status: Former    Current packs/day: 0.00    Average packs/day: 0.1 packs/day for 7.0 years (0.7 ttl pk-yrs)    Types: Cigarettes    Start date: 03/18/2010    Quit date: 03/18/2017    Years since quitting: 7.2   Smokeless tobacco: Never  Vaping Use   Vaping status: Former    Substances: Nicotine  Substance and Sexual Activity   Alcohol use: Yes    Alcohol/week: 2.0 standard drinks of alcohol    Types: 1 Glasses of wine, 1 Cans of beer per week    Comment: once a week   Drug use: No   Sexual activity: Yes    Partners: Male  Other Topics Concern   Not on file  Social History Narrative   Patient lives at home with parents . 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    Right handed.   Caffeine- One cup of coffee daily and one soda.    Social Drivers of Corporate investment banker Strain: Low Risk  (05/28/2024)   Overall Financial Resource Strain (CARDIA)    Difficulty of Paying Living Expenses: Not hard at all  Food Insecurity: No Food Insecurity (05/28/2024)   Hunger Vital Sign    Worried About Running Out of Food in the Last Year: Never true    Ran Out of Food in the Last Year: Never true  Transportation Needs: No Transportation Needs (05/28/2024)   PRAPARE - Administrator, Civil Service (Medical): No    Lack of Transportation (Non-Medical): No  Physical Activity: Inactive (05/28/2024)   Exercise Vital Sign    Days of Exercise per Week: 0 days    Minutes of Exercise per Session: Not on file  Stress: No Stress Concern Present (05/28/2024)   [employer] of Occupational Health - Occupational Stress Questionnaire    Feeling of Stress: Not at all  Social Connections: Socially Isolated (05/28/2024)   Social Connection and Isolation Panel    Frequency of Communication with Friends and Family: Twice a week    Frequency of Social Gatherings with Friends and Family: Once a week    Attends Religious Services: Never    Database administrator or Organizations: No    Attends Engineer, structural: Not on file    Marital Status: Never married    Family History  Problem Relation Age of Onset   Anxiety disorder Mother    Depression Mother    High blood pressure Father  Arthritis Father    Anxiety  disorder Sister    Depression Sister    Physical abuse Sister    Sexual abuse Sister    Anxiety disorder Maternal Grandfather    Dementia Maternal Grandfather    Depression Maternal Grandfather    ADD / ADHD Maternal Aunt    Anxiety disorder Maternal Aunt    Depression Maternal Aunt    Colon cancer Neg Hx    Stomach cancer Neg Hx    Esophageal cancer Neg Hx     Current Outpatient Medications on File Prior to Visit  Medication Sig Dispense Refill   albuterol  (VENTOLIN  HFA) 108 (90 Base) MCG/ACT inhaler Inhale 2 puffs into the lungs every 6 (six) hours as needed for wheezing or shortness of breath. 6.7 g 2   amphetamine -dextroamphetamine  (ADDERALL XR) 20 MG 24 hr capsule Take 1 capsule (20 mg total) by mouth every morning. 30 capsule 0   cyclobenzaprine  (FLEXERIL ) 10 MG tablet Take 1 tablet (10 mg total) by mouth 3 (three) times daily as needed. 90 tablet 3   DULoxetine  (CYMBALTA ) 60 MG capsule Take 1 capsule (60 mg total) by mouth daily. 90 capsule 1   metoCLOPramide  (REGLAN ) 10 MG tablet Take 1 tablet (10 mg total) by mouth 2 (two) times daily as needed. Take before a meal 180 tablet 0   naproxen  (NAPROSYN ) 375 MG tablet Take 1 tablet (375 mg total) by mouth 2 (two) times daily. (Patient taking differently: Take 375 mg by mouth as needed.) 20 tablet 0   omeprazole  (PRILOSEC) 40 MG capsule Take 1 capsule (40 mg total) by mouth 2 (two) times daily. 90 capsule 1   traZODone  (DESYREL ) 100 MG tablet Take 1 tablet (100 mg total) by mouth at bedtime. 90 tablet 1   [START ON 06/29/2024] amphetamine -dextroamphetamine  (ADDERALL XR) 20 MG 24 hr capsule Take 1 capsule (20 mg total) by mouth every morning. 30 capsule 0   [START ON 07/29/2024] amphetamine -dextroamphetamine  (ADDERALL XR) 20 MG 24 hr capsule Take 1 capsule (20 mg total) by mouth daily. 30 capsule 0   sertraline  (ZOLOFT ) 100 MG tablet Take 2 tablets (200 mg total) by mouth in the morning. (Patient not taking: Reported on 06/04/2024) 180  tablet 1   No current facility-administered medications on file prior to visit.    Allergies  Allergen Reactions   Peanut-Containing Drug Products Swelling and Other (See Comments)    Childhood reaction- lips became swollen/breathing not affected, though     Objective:   Vitals:   06/04/24 1538  BP: 126/75  Pulse: (!) 120  Weight: (!) 361 lb 0.6 oz (163.8 kg)  Height: 5' 7 (1.702 m)   Physical Examination:   General appearance - well appearing, and in no distress  Mental status - alert, oriented to person, place, and time  Psych:  normal mood and affect  Skin - warm and dry, normal color, no suspicious lesions noted  Breasts - breasts appear normal, no suspicious masses, no skin or nipple changes or  axillary nodes  Abdomen - soft, nontender, nondistended, no masses or organomegaly  Pelvic -  VULVA: normal appearing vulva with no masses, tenderness or lesions   VAGINA: normal appearing vagina with normal color and discharge, no lesions   CERVIX: normal appearing cervix without discharge or lesions, no CMT  Thin prep pap is done with reflex HR HPV cotesting  UTERUS: uterus is felt to be normal size, shape, consistency and nontender   ADNEXA: No adnexal masses or tenderness noted.  Extremities:  No swelling or varicosities noted  Chaperone present for exam  Assessment and Plan:    1. Well woman exam with routine gynecological exam (Primary) Pap Normal clinical breast exam, reviewed self breast exams, mammograms to start at age 66 Declines STI screening Depo for contraception  2. Cervical cancer screening Pap  3. Initiation of Depo Provera Counseled on depo common side effects including risks of amenorrhea or irregular bleeding/spotting. We also reviewed recent news of brain meningiomas with depo use. I reviewed that while this risk is increased compared to non-depo users, the overall risk is very low. According to the study, five out of 10,000 women using  medroxyprogesterone acetate may possibly develop meningioma compared to one out of 10,000 women not using the medication. She is comfortable proceeding with depo today. She has not had unprotected sex since her last period    No follow-ups on file.  Future Appointments  Date Time Provider Department Center  11/28/2024  3:00 PM Antoniette Vermell CROME, PA-C [facility] Bonni Rollo ONEIDA Abigail, MD, FACOG Obstetrician & Gynecologist, [facility] for [facility], [facility]

## 2024-06-08 LAB — CYTOLOGY - PAP
Diagnosis: NEGATIVE
Diagnosis: REACTIVE

## 2024-06-11 ENCOUNTER — Ambulatory Visit: Payer: Self-pay | Admitting: Obstetrics and Gynecology

## 2024-07-16 ENCOUNTER — Other Ambulatory Visit (HOSPITAL_BASED_OUTPATIENT_CLINIC_OR_DEPARTMENT_OTHER): Payer: Self-pay

## 2024-07-17 ENCOUNTER — Other Ambulatory Visit: Payer: Self-pay

## 2024-08-19 ENCOUNTER — Telehealth: Admitting: Nurse Practitioner

## 2024-08-19 DIAGNOSIS — B9689 Other specified bacterial agents as the cause of diseases classified elsewhere: Secondary | ICD-10-CM | POA: Diagnosis not present

## 2024-08-19 DIAGNOSIS — N760 Acute vaginitis: Secondary | ICD-10-CM | POA: Diagnosis not present

## 2024-08-19 MED ORDER — METRONIDAZOLE 500 MG PO TABS
500.0000 mg | ORAL_TABLET | Freq: Two times a day (BID) | ORAL | 0 refills | Status: AC
  Filled 2024-08-19: qty 14, 7d supply, fill #0

## 2024-08-19 NOTE — Progress Notes (Signed)
 We are sorry that you are not feeling well. Here is how we plan to help! Based on what you shared with me it looks like you: May have a vaginosis due to bacteria  Vaginosis is an inflammation of the vagina that can result in discharge, itching and pain. The cause is usually a change in the normal balance of vaginal bacteria or an infection. Vaginosis can also result from reduced estrogen levels after menopause.  The most common causes of vaginosis are:   Bacterial vaginosis which results from an overgrowth of one on several organisms that are normally present in your vagina.   Yeast infections which are caused by a naturally occurring fungus called candida.   Vaginal atrophy (atrophic vaginosis) which results from the thinning of the vagina from reduced estrogen levels after menopause.   Trichomoniasis which is caused by a parasite and is commonly transmitted by sexual intercourse.  Factors that increase your risk of developing vaginosis include: Medications, such as antibiotics and steroids Uncontrolled diabetes Use of hygiene products such as bubble bath, vaginal spray or vaginal deodorant Douching Wearing damp or tight-fitting clothing Using an intrauterine device (IUD) for birth control Hormonal changes, such as those associated with pregnancy, birth control pills or menopause Sexual activity Having a sexually transmitted infection  Your treatment plan is Metronidazole  or Flagyl  500mg  twice a day for 7 days.  I have electronically sent this prescription into the pharmacy that you have chosen.  Be sure to take all of the medication as directed. Stop taking any medication if you develop a rash, tongue swelling or shortness of breath. Mothers who are breast feeding should consider pumping and discarding their breast milk while on these antibiotics. However, there is no consensus that infant exposure at these doses would be harmful.  Remember that medication creams can weaken latex  condoms.   HOME CARE:  Good hygiene may prevent some types of vaginosis from recurring and may relieve some symptoms:  Avoid baths, hot tubs and whirlpool spas. Rinse soap from your outer genital area after a shower, and dry the area well to prevent irritation. Don't use scented or harsh soaps, such as those with deodorant or antibacterial action. Avoid irritants. These include scented tampons and pads. Wipe from front to back after using the toilet. Doing so avoids spreading fecal bacteria to your vagina.  Other things that may help prevent vaginosis include:  Don't douche. Your vagina doesn't require cleansing other than normal bathing. Repetitive douching disrupts the normal organisms that reside in the vagina and can actually increase your risk of vaginal infection. Douching won't clear up a vaginal infection. Use a latex condom. Both female and female latex condoms may help you avoid infections spread by sexual contact. Wear cotton underwear. Also wear pantyhose with a cotton crotch. If you feel comfortable without it, skip wearing underwear to bed. Yeast thrives in [employer] Your symptoms should improve in the next day or two.  GET HELP RIGHT AWAY IF:  You have pain in your lower abdomen ( pelvic area or over your ovaries) You develop nausea or vomiting You develop a fever Your discharge changes or worsens You have persistent pain with intercourse You develop shortness of breath, a rapid pulse, or you faint.  These symptoms could be signs of problems or infections that need to be evaluated by a medical provider now.  MAKE SURE YOU   Understand these instructions. Will watch your condition. Will get help right away if you are not doing  well or get worse.  Your e-visit answers were reviewed by a board certified advanced clinical practitioner to complete your personal care plan. Depending upon the condition, your plan could have included both over the counter or  prescription medications. Please review your pharmacy choice to make sure that you have choses a pharmacy that is open for you to pick up any needed prescription, Your safety is important to us . If you have drug allergies check your prescription carefully.   You can use MyChart to ask questions about today's visit, request a non-urgent call back, or ask for a work or school excuse for 24 hours related to this e-Visit. If it has been greater than 24 hours you will need to follow up with your provider, or enter a new e-Visit to address those concerns. You will get a MyChart message within the next two days asking about your experience. I hope that your e-visit has been valuable and will speed your recovery.  I have spent 5 minutes in review of e-visit questionnaire, review and updating patient chart, medical decision making and response to patient.   Cniyah Sproull W Caila Cirelli, NP

## 2024-08-20 ENCOUNTER — Other Ambulatory Visit (HOSPITAL_BASED_OUTPATIENT_CLINIC_OR_DEPARTMENT_OTHER): Payer: Self-pay

## 2024-08-23 ENCOUNTER — Other Ambulatory Visit: Payer: Self-pay

## 2024-08-23 ENCOUNTER — Other Ambulatory Visit (HOSPITAL_BASED_OUTPATIENT_CLINIC_OR_DEPARTMENT_OTHER): Payer: Self-pay

## 2024-08-23 ENCOUNTER — Ambulatory Visit

## 2024-08-23 VITALS — BP 109/81 | HR 99 | Ht 67.0 in | Wt 320.0 lb

## 2024-08-23 DIAGNOSIS — Z3042 Encounter for surveillance of injectable contraceptive: Secondary | ICD-10-CM | POA: Diagnosis not present

## 2024-08-23 MED ORDER — MEDROXYPROGESTERONE ACETATE 150 MG/ML IM SUSY
150.0000 mg | PREFILLED_SYRINGE | Freq: Once | INTRAMUSCULAR | Status: AC
  Administered 2024-08-23: 150 mg via INTRAMUSCULAR

## 2024-08-23 MED ORDER — MEDROXYPROGESTERONE ACETATE 150 MG/ML IM SUSP
150.0000 mg | INTRAMUSCULAR | 0 refills | Status: AC
  Filled 2024-08-23: qty 1, 90d supply, fill #0

## 2024-08-23 NOTE — Progress Notes (Signed)
 Date last pap: 06/04/2024. Last Depo-Provera : 06/04/2024. Side Effects if any: None Serum HCG indicated? No. Depo-Provera  150 mg IM given by: [facility] RN. Next appointment due 11/08/24-10/22/2024  Erminio DELENA Rumps, [place] .

## 2024-09-03 ENCOUNTER — Other Ambulatory Visit: Payer: Self-pay

## 2024-09-04 ENCOUNTER — Other Ambulatory Visit (HOSPITAL_BASED_OUTPATIENT_CLINIC_OR_DEPARTMENT_OTHER): Payer: Self-pay

## 2024-09-07 ENCOUNTER — Ambulatory Visit: Admitting: Medical-Surgical

## 2024-09-07 ENCOUNTER — Other Ambulatory Visit (HOSPITAL_BASED_OUTPATIENT_CLINIC_OR_DEPARTMENT_OTHER): Payer: Self-pay

## 2024-09-07 ENCOUNTER — Encounter: Payer: Self-pay | Admitting: Medical-Surgical

## 2024-09-07 ENCOUNTER — Ambulatory Visit

## 2024-09-07 VITALS — BP 122/81 | HR 95 | Temp 99.2°F | Resp 18 | Ht 67.0 in | Wt 355.0 lb

## 2024-09-07 DIAGNOSIS — M7989 Other specified soft tissue disorders: Secondary | ICD-10-CM

## 2024-09-07 DIAGNOSIS — M79645 Pain in left finger(s): Secondary | ICD-10-CM

## 2024-09-07 MED ORDER — HYDROCODONE-ACETAMINOPHEN 5-325 MG PO TABS
1.0000 | ORAL_TABLET | Freq: Three times a day (TID) | ORAL | 0 refills | Status: AC | PRN
  Filled 2024-09-07: qty 30, 5d supply, fill #0

## 2024-09-07 NOTE — Progress Notes (Signed)
" ° °       Established patient visit   History of Present Illness   Discussed the use of AI scribe software for clinical note transcription with the patient, who gave verbal consent to proceed.  History of Present Illness   Emma Boyer is a 29 year old female who presents with left thumb pain and recurrent dislocations.  Left thumb pain and instability - Left thumb pain with recurrent sensations of dislocation since the week before Christmas - Dislocation sensations occur up to twice daily, including during sleep - Burning pain radiates from the base of the thumb up the wrist and into the distal forearm - Pain is worse during and after relocation - Pain is severe enough to limit daily tasks, including putting her hair up - Difficulty moving the thumb, especially with hyperextension  Prior evaluation and management - No prior evaluation for this issue - No use of bracing - Tylenol  has not provided relief  Associated symptoms and trauma - No recent falls, injuries, or trauma to the thumb     Physical Exam   Physical Exam Vitals reviewed.  Constitutional:      General: She is not in acute distress.    Appearance: Normal appearance. She is not ill-appearing.  HENT:     Head: Normocephalic and atraumatic.  Cardiovascular:     Rate and Rhythm: Normal rate and regular rhythm.  Pulmonary:     Effort: Pulmonary effort is normal. No respiratory distress.     Breath sounds: Normal breath sounds.  Musculoskeletal:     Left hand: Swelling and tenderness present. Decreased range of motion. Decreased strength (secondary to pain) of finger abduction (left thumb). Normal sensation. Normal capillary refill. Normal pulse.     Comments: Swelling at the base of the thumb into the palmar surface. Tenderness to palpation along the dorsal thumb extending up the tendon and into the forearm consistent with potential DeQuervain's tenosynovitis. Limited active and passive ROM secondary to severe  pain. No crepitus, active dislocation, step-offs, or deformities noted on exam.   Skin:    General: Skin is warm and dry.  Neurological:     Mental Status: She is alert and oriented to person, place, and time.  Psychiatric:        Mood and Affect: Mood normal.        Behavior: Behavior normal.        Thought Content: Thought content normal.        Judgment: Judgment normal.    Assessment & Plan   Left thumb pain with recurrent  sensations of subluxation and possible tenosynovitis Severe pain with recurrent subluxation sensations, swelling, and limited range of motion. Differential includes tenosynovitis and bony abnormalities. Tylenol  ineffective. - Ordered x-ray of left thumb to assess for bony abnormalities. - Fitted for thumb spica brace to stabilize thumb. - Prescribed Norco 1-2 tablets every 8 hours as needed for pain, 5-day supply. - Referred to orthopedics for further evaluation and management.     Follow up   Return if symptoms worsen or fail to improve. __________________________________ Zada FREDRIK Palin, DNP, APRN, FNP-BC Primary Care and Sports Medicine [facility] [place] "

## 2024-09-10 ENCOUNTER — Other Ambulatory Visit (HOSPITAL_BASED_OUTPATIENT_CLINIC_OR_DEPARTMENT_OTHER): Payer: Self-pay

## 2024-09-11 ENCOUNTER — Ambulatory Visit: Payer: Self-pay | Admitting: Medical-Surgical

## 2024-11-08 ENCOUNTER — Ambulatory Visit

## 2024-11-28 ENCOUNTER — Ambulatory Visit: Admitting: Physician Assistant
